# Patient Record
Sex: Male | Born: 1965 | Race: White | Hispanic: No | Marital: Married | Smoking: Never smoker
Health system: Southern US, Community
[De-identification: ages and names within clinical notes are randomized; demographics above are authoritative.]

## PROBLEM LIST (undated history)

## (undated) DIAGNOSIS — R011 Cardiac murmur, unspecified: Secondary | ICD-10-CM

## (undated) DIAGNOSIS — E7800 Pure hypercholesterolemia, unspecified: Secondary | ICD-10-CM

## (undated) DIAGNOSIS — I499 Cardiac arrhythmia, unspecified: Secondary | ICD-10-CM

## (undated) DIAGNOSIS — Z9889 Other specified postprocedural states: Secondary | ICD-10-CM

## (undated) DIAGNOSIS — K219 Gastro-esophageal reflux disease without esophagitis: Secondary | ICD-10-CM

## (undated) DIAGNOSIS — R112 Nausea with vomiting, unspecified: Secondary | ICD-10-CM

## (undated) DIAGNOSIS — Q248 Other specified congenital malformations of heart: Secondary | ICD-10-CM

## (undated) HISTORY — PX: CARDIAC CATHETERIZATION: SHX172

## (undated) HISTORY — DX: Cardiac murmur, unspecified: R01.1

## (undated) HISTORY — DX: Other specified congenital malformations of heart: Q24.8

## (undated) HISTORY — PX: CARDIAC VALVE REPLACEMENT: SHX585

## (undated) HISTORY — PX: CORONARY ARTERY BYPASS GRAFT: SHX141

---

## 1999-08-19 HISTORY — PX: TONSILLECTOMY: SUR1361

## 2004-08-18 HISTORY — PX: INGUINAL HERNIA REPAIR: SUR1180

## 2008-08-18 HISTORY — PX: COLONOSCOPY: SHX174

## 2008-10-19 ENCOUNTER — Ambulatory Visit: Payer: Self-pay | Admitting: Gastroenterology

## 2009-05-11 ENCOUNTER — Ambulatory Visit: Payer: Self-pay | Admitting: Internal Medicine

## 2010-05-01 LAB — HM COLONOSCOPY: HM Colonoscopy: NORMAL

## 2010-10-01 ENCOUNTER — Ambulatory Visit: Payer: Self-pay | Admitting: Internal Medicine

## 2011-04-04 ENCOUNTER — Encounter: Payer: Self-pay | Admitting: Internal Medicine

## 2011-04-09 ENCOUNTER — Encounter: Payer: Self-pay | Admitting: Internal Medicine

## 2011-04-09 ENCOUNTER — Ambulatory Visit (INDEPENDENT_AMBULATORY_CARE_PROVIDER_SITE_OTHER): Payer: 59 | Admitting: Internal Medicine

## 2011-04-09 DIAGNOSIS — R5381 Other malaise: Secondary | ICD-10-CM

## 2011-04-09 DIAGNOSIS — E785 Hyperlipidemia, unspecified: Secondary | ICD-10-CM

## 2011-04-09 DIAGNOSIS — R5383 Other fatigue: Secondary | ICD-10-CM

## 2011-04-09 DIAGNOSIS — Z9889 Other specified postprocedural states: Secondary | ICD-10-CM

## 2011-04-09 DIAGNOSIS — L42 Pityriasis rosea: Secondary | ICD-10-CM

## 2011-04-09 DIAGNOSIS — K219 Gastro-esophageal reflux disease without esophagitis: Secondary | ICD-10-CM

## 2011-04-09 DIAGNOSIS — Z8719 Personal history of other diseases of the digestive system: Secondary | ICD-10-CM

## 2011-04-09 DIAGNOSIS — R21 Rash and other nonspecific skin eruption: Secondary | ICD-10-CM

## 2011-04-09 DIAGNOSIS — Z125 Encounter for screening for malignant neoplasm of prostate: Secondary | ICD-10-CM

## 2011-04-09 NOTE — Assessment & Plan Note (Signed)
Symptoms now controlled with daily omeprazole.  He has deferred EGD given resolution of symptoms currently.  Recommended EGD with next colonoscopy.  Reviewed behavioral modifications to prevent noncturnal symptoms.

## 2011-04-09 NOTE — Assessment & Plan Note (Signed)
Managed with fenofibrate and niacin.  Repeat labs ordered today through [facility]

## 2011-04-09 NOTE — Progress Notes (Signed)
  Subjective:    Patient ID: Frank [name], male    DOB: 1966-07-24, 45 y.o.   MRN: [id number]  HPI    Review of Systems     Objective:   Physical Exam        Assessment & Plan:   Subjective:     Siddiq Kaluzny is a 45 y.o. male who presents for evaluation of a rash involving the chest. Rash started 3 months ago. Lesions are pink, and flat in texture. Rash has not changed over time. Rash causes no discomfort. Associated symptoms: none. Patient denies: abdominal pain and fever. Patient has not had contacts with similar rash. Patient has not had new exposures (soaps, lotions, laundry detergents, foods, medications, plants, insects or animals).  The following portions of the patient's history were reviewed and updated as appropriate: allergies, current medications, past family history, past medical history, past social history, past surgical history and problem list.  Review of Systems A comprehensive review of systems was negative except for: Integument/breast: positive for rash and skin lesion(s)    Objective:    BP 152/86  Pulse 76  Temp(Src) 97.6 F (36.4 C) (Oral)  Resp 14  Ht 5\' 9"  (1.753 m)  Wt 174 lb 8 oz (79.153 kg)  BMI 25.77 kg/m2 General:  alert, cooperative and appears stated age  Skin:  hyperpigmentation noted on trunk     Assessment:    pityriasis rosea    Plan:    Medications: lotrimin.

## 2011-04-09 NOTE — Assessment & Plan Note (Signed)
He had some pain in prior surgical region during recent coughing epiodes and requested hernia check.  Exam was normal.  Reassurance provided.

## 2011-04-09 NOTE — Assessment & Plan Note (Signed)
His rash apears to be pityriasis rosea, limited to the trunk.Marland Kitchen  He is concerned about tinea.  Explained that pityriasis has no treatment , is self limiting, but he is welcome to try otc lamisil on the lesion.

## 2011-04-09 NOTE — Assessment & Plan Note (Signed)
Prostate exam was normal.  He is requesting annual PSAs and will have it done next week.

## 2011-10-21 ENCOUNTER — Encounter: Payer: Self-pay | Admitting: Internal Medicine

## 2012-04-15 ENCOUNTER — Encounter: Payer: Self-pay | Admitting: Internal Medicine

## 2012-04-15 ENCOUNTER — Ambulatory Visit (INDEPENDENT_AMBULATORY_CARE_PROVIDER_SITE_OTHER): Payer: 59 | Admitting: Internal Medicine

## 2012-04-15 VITALS — BP 140/88 | HR 94 | Temp 98.6°F | Resp 16 | Ht 69.0 in | Wt 172.5 lb

## 2012-04-15 DIAGNOSIS — Z125 Encounter for screening for malignant neoplasm of prostate: Secondary | ICD-10-CM

## 2012-04-15 DIAGNOSIS — R5381 Other malaise: Secondary | ICD-10-CM

## 2012-04-15 DIAGNOSIS — R030 Elevated blood-pressure reading, without diagnosis of hypertension: Secondary | ICD-10-CM

## 2012-04-15 DIAGNOSIS — G4700 Insomnia, unspecified: Secondary | ICD-10-CM

## 2012-04-15 DIAGNOSIS — R5383 Other fatigue: Secondary | ICD-10-CM

## 2012-04-15 DIAGNOSIS — Z1211 Encounter for screening for malignant neoplasm of colon: Secondary | ICD-10-CM

## 2012-04-15 DIAGNOSIS — Z13220 Encounter for screening for lipoid disorders: Secondary | ICD-10-CM

## 2012-04-15 MED ORDER — ALPRAZOLAM 0.5 MG PO TBDP: 0.5000 mg | ORAL_TABLET | Freq: Two times a day (BID) | ORAL | Status: DC | PRN

## 2012-04-15 NOTE — Progress Notes (Signed)
Patient ID: Frank [name], male   DOB: October 01, 1965, 46 y.o.   MRN: [id number]  Patient Active Problem List  Diagnosis  . Esophageal reflux  . Hx of inguinal hernia surgery  . Other malaise and fatigue  . Special screening for malignant neoplasm of prostate  . Other and unspecified hyperlipidemia  . Rash and nonspecific skin eruption  . Insomnia    Subjective:  CC:   Chief Complaint  Patient presents with  . Annual Exam    HPI:   Frank [name] a 46 y.o. male who presents  Past Medical History  Diagnosis Date  . Congenital heart valve abnormality     bicuspid    Past Surgical History  Procedure Date  . Inguinal hernia repair 2006    left, Dr. Lemar Livings  . Tonsillectomy 2001         The following portions of the patient's history were reviewed and updated as appropriate: Allergies, current medications, and problem list.    Review of Systems:   12 Pt  review of systems was negative except those addressed in the HPI,     History   Social History  . Marital Status: Married    Spouse Name: N/A    Number of Children: N/A  . Years of Education: N/A   Occupational History  . 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 Engineer, water    Social History Main Topics  . Smoking status: Never Smoker   . Smokeless tobacco: Never Used  . Alcohol Use: Yes     occasional  . Drug Use: No  . Sexually Active: Not on file   Other Topics Concern  . Not on file   Social History Narrative   Pt has a dog.Exercises regularly- isometrics, no running.    Objective:  BP 140/88  Pulse 94  Temp 98.6 F (37 C) (Oral)  Resp 16  Ht 5\' 9"  (1.753 m)  Wt 172 lb 8 oz (78.245 kg)  BMI 25.47 kg/m2  SpO2 98%  General appearance: alert, cooperative and appears stated age Ears: normal TM's and external ear canals both ears Throat: lips, mucosa, and tongue normal; teeth and gums normal Neck: no adenopathy, no carotid bruit, supple, symmetrical,  trachea midline and thyroid not enlarged, symmetric, no tenderness/mass/nodules Back: symmetric, no curvature. ROM normal. No CVA tenderness. Lungs: clear to auscultation bilaterally Heart: regular rate and rhythm, S1, S2 normal, no murmur, click, rub or gallop Abdomen: soft, non-tender; bowel sounds normal; no masses,  no organomegaly Uro: testicular and prostate exam normal.  No hernias.  Stool was hemooccult negative.  Pulses: 2+ and symmetric Skin: Skin color, texture, turgor normal. No rashes or lesions Lymph nodes: Cervical, supraclavicular, and axillary nodes normal.  Assessment and Plan:  Screening for prostate cancer Digital rectal exam today was normal prostate and no masses.  Screening for colon cancer  Digital rectal exam today was normal Hemoccult is negative.   Updated Medication List Outpatient Encounter Prescriptions as of 04/15/2012  Medication Sig Dispense Refill  . fexofenadine (ALLEGRA) 180 MG tablet Take 180 mg by mouth daily.        . Inositol Niacinate (NIACIN FLUSH FREE) 500 MG CAPS Take 2 by mouth daily       . omeprazole (PRILOSEC) 40 MG capsule Take 40 mg by mouth daily.        . Red Yeast Rice 600 MG CAPS Take one by mouth twice a day.       Marland Kitchen  ALPRAZolam (XANAX) 0.5 MG tablet Take 1 tablet (0.5 mg total) by mouth at bedtime as needed for sleep.  30 tablet  3  . DISCONTD: ALPRAZolam (NIRAVAM) 0.5 MG dissolvable tablet Take 1 tablet (0.5 mg total) by mouth 2 (two) times daily as needed for anxiety.  60 tablet  0

## 2012-04-17 DIAGNOSIS — Z125 Encounter for screening for malignant neoplasm of prostate: Secondary | ICD-10-CM | POA: Insufficient documentation

## 2012-04-17 DIAGNOSIS — Z1211 Encounter for screening for malignant neoplasm of colon: Secondary | ICD-10-CM | POA: Insufficient documentation

## 2012-04-17 MED ORDER — ALPRAZOLAM 0.5 MG PO TABS: 0.5000 mg | ORAL_TABLET | Freq: Every evening | ORAL | Status: AC | PRN

## 2012-04-17 NOTE — Assessment & Plan Note (Signed)
Digital rectal exam today was normal prostate and no masses.

## 2012-04-17 NOTE — Assessment & Plan Note (Signed)
Digital rectal exam today was normal Hemoccult is negative.

## 2012-04-28 ENCOUNTER — Other Ambulatory Visit: Payer: Self-pay | Admitting: Internal Medicine

## 2012-04-29 LAB — HEPATIC FUNCTION PANEL
ALT: 16 IU/L (ref 0–44)
AST: 21 IU/L (ref 0–40)
Bilirubin, Direct: 0.1 mg/dL (ref 0.00–0.40)
Total Bilirubin: 0.4 mg/dL (ref 0.0–1.2)

## 2012-04-29 LAB — BASIC METABOLIC PANEL
BUN: 18 mg/dL (ref 6–24)
Calcium: 10.3 mg/dL — ABNORMAL HIGH (ref 8.7–10.2)
Creatinine, Ser: 1.38 mg/dL — ABNORMAL HIGH (ref 0.76–1.27)
GFR calc Af Amer: 70 mL/min/{1.73_m2} (ref >59)
GFR calc non Af Amer: 60 mL/min/{1.73_m2} (ref >59)
Glucose: 87 mg/dL (ref 65–99)

## 2012-04-29 LAB — LIPID PANEL W/O CHOL/HDL RATIO
Cholesterol, Total: 283 mg/dL — ABNORMAL HIGH (ref 100–199)
LDL Calculated: 201 mg/dL — ABNORMAL HIGH (ref 0–99)
Triglycerides: 185 mg/dL — ABNORMAL HIGH (ref 0–149)
VLDL Cholesterol Cal: 37 mg/dL (ref 5–40)

## 2012-07-20 ENCOUNTER — Telehealth: Payer: Self-pay | Admitting: Internal Medicine

## 2012-07-20 NOTE — Telephone Encounter (Signed)
Pt sent my chart note to get appointment to address the following health maintenance concerns  Tetanus/tdap Does pt need appointment for these or can i make nurse visit for this.  Also can pt get both at same time

## 2012-07-21 NOTE — Telephone Encounter (Signed)
It is one shot called TDaP and he needs an RN appt only

## 2012-07-21 NOTE — Telephone Encounter (Signed)
Appointment for tday 08/28/11 sent message through my chart

## 2012-07-22 ENCOUNTER — Encounter: Payer: Self-pay | Admitting: Adult Health

## 2012-07-22 ENCOUNTER — Ambulatory Visit (INDEPENDENT_AMBULATORY_CARE_PROVIDER_SITE_OTHER): Payer: 59 | Admitting: Adult Health

## 2012-07-22 VITALS — BP 128/91 | HR 98 | Temp 98.2°F | Ht 68.0 in | Wt 177.0 lb

## 2012-07-22 DIAGNOSIS — J029 Acute pharyngitis, unspecified: Secondary | ICD-10-CM

## 2012-07-22 MED ORDER — AMOXICILLIN-POT CLAVULANATE 875-125 MG PO TABS: 1.0000 | ORAL_TABLET | Freq: Two times a day (BID) | ORAL | Status: DC

## 2012-07-22 MED ORDER — GUAIFENESIN-CODEINE 100-10 MG/5ML PO SYRP: 5.0000 mL | ORAL_SOLUTION | Freq: Three times a day (TID) | ORAL | Status: DC | PRN

## 2012-07-22 NOTE — Progress Notes (Signed)
  Subjective:    Patient ID: Frank [name], male    DOB: 10-04-1965, 46 y.o.   MRN: [id number]  HPI  [name] is a very pleasant 46 y/o gentleman who presents with 3 day hx of general malaise, fever 101, chills, cough, sore throat, post nasal drip, now with productive greenish sputum. He has tried Delsym for cough without relief. He has been takingTylenol 500mg  q 4h prn for general malaise and to control fever. Reports that he has been out of work since Monday secondary to above symptoms. Patient reports receiving flu vaccine 5 weeks ago.  Current Outpatient Prescriptions on File Prior to Visit  Medication Sig Dispense Refill  . fexofenadine (ALLEGRA) 180 MG tablet Take 180 mg by mouth daily.        . Inositol Niacinate (NIACIN FLUSH FREE) 500 MG CAPS Take 2 by mouth daily       . omeprazole (PRILOSEC) 40 MG capsule Take 40 mg by mouth daily.        . Red Yeast Rice 600 MG CAPS Take one by mouth twice a day.          Review of Systems  Constitutional: Positive for fever and chills.  HENT: Positive for sore throat, rhinorrhea and postnasal drip.   Respiratory: Positive for cough. Negative for chest tightness, shortness of breath and wheezing.   Cardiovascular: Negative for chest pain.       Systolic murmur, congenital aortic stenosis  Gastrointestinal: Negative.   Neurological: Negative.        Sleep disturbance secondary to cough    BP 128/91  Pulse 98  Temp 98.2 F (36.8 C) (Oral)  Ht 5\' 8"  (1.727 m)  Wt 177 lb (80.287 kg)  BMI 26.91 kg/m2  SpO2 98%    Objective:   Physical Exam  Constitutional: He is oriented to person, place, and time. He appears well-developed and well-nourished.  HENT:       Pharyngeal erythema, post nasal drip  Cardiovascular: Normal rate and regular rhythm.   Murmur heard.      systolic  Pulmonary/Chest: No respiratory distress. He has no wheezes. He has no rales.  Neurological: He is alert and oriented to person, place, and time.  Skin: Skin  is warm and dry.  Psychiatric: He has a normal mood and affect. His behavior is normal. Judgment and thought content normal.        Assessment & Plan:

## 2012-07-22 NOTE — Patient Instructions (Addendum)
Start augmentin today. Take Robitussin AC up to 3 times daily as needed for cough. This medication will cause some sedation.  Call if symptoms do not improve in 2-3 days.

## 2012-07-22 NOTE — Assessment & Plan Note (Signed)
Pharyngeal erythema with post nasal drip. Abrupt onset of symptoms this past Monday. Ordered throat cultures for step and influenza at [employer]. Start Augmentin and Robitussin w/ codeine for cough. Continue tylenol for fever and general malaise as needed. Call if symptoms worsen or if no improvement in 2-3 days.

## 2012-07-26 ENCOUNTER — Telehealth: Payer: Self-pay | Admitting: Internal Medicine

## 2012-07-26 ENCOUNTER — Other Ambulatory Visit: Payer: Self-pay

## 2012-07-26 ENCOUNTER — Other Ambulatory Visit: Payer: Self-pay | Admitting: Internal Medicine

## 2012-07-26 MED ORDER — ALPRAZOLAM 0.5 MG PO TABS: 0.5000 mg | ORAL_TABLET | Freq: Every evening | ORAL | Status: DC | PRN

## 2012-07-26 NOTE — Telephone Encounter (Signed)
Spoke to patient via phone gave him lab results. 

## 2012-07-26 NOTE — Telephone Encounter (Signed)
HIS STREP CULTURE AND INFLUENZA CULTURES WERE NEGATIVE

## 2012-08-06 ENCOUNTER — Encounter: Payer: Self-pay | Admitting: Adult Health

## 2012-08-06 ENCOUNTER — Other Ambulatory Visit: Payer: Self-pay | Admitting: Adult Health

## 2012-08-06 DIAGNOSIS — J029 Acute pharyngitis, unspecified: Secondary | ICD-10-CM

## 2012-08-06 MED ORDER — AMOXICILLIN-POT CLAVULANATE 875-125 MG PO TABS: 1.0000 | ORAL_TABLET | Freq: Two times a day (BID) | ORAL | Status: DC

## 2012-08-06 MED ORDER — GUAIFENESIN-CODEINE 100-10 MG/5ML PO SYRP: 5.0000 mL | ORAL_SOLUTION | Freq: Three times a day (TID) | ORAL | Status: DC | PRN

## 2012-08-10 ENCOUNTER — Encounter: Payer: Self-pay | Admitting: Internal Medicine

## 2012-08-27 ENCOUNTER — Ambulatory Visit (INDEPENDENT_AMBULATORY_CARE_PROVIDER_SITE_OTHER): Payer: 59 | Admitting: Internal Medicine

## 2012-08-27 DIAGNOSIS — Z23 Encounter for immunization: Secondary | ICD-10-CM

## 2012-08-29 NOTE — Progress Notes (Signed)
Patient ID: Frank [name], male   DOB: [birth year], 47 y.o.   MRN: [id number]  Patient is here for an injection.

## 2013-04-20 ENCOUNTER — Encounter: Payer: Self-pay | Admitting: Emergency Medicine

## 2013-04-20 ENCOUNTER — Ambulatory Visit (INDEPENDENT_AMBULATORY_CARE_PROVIDER_SITE_OTHER): Payer: 59 | Admitting: Internal Medicine

## 2013-04-20 ENCOUNTER — Encounter: Payer: Self-pay | Admitting: Internal Medicine

## 2013-04-20 VITALS — BP 142/84 | HR 87 | Temp 97.8°F | Resp 14 | Ht 70.25 in | Wt 171.0 lb

## 2013-04-20 DIAGNOSIS — D229 Melanocytic nevi, unspecified: Secondary | ICD-10-CM

## 2013-04-20 DIAGNOSIS — G4700 Insomnia, unspecified: Secondary | ICD-10-CM

## 2013-04-20 DIAGNOSIS — Z0000 Encounter for general adult medical examination without abnormal findings: Secondary | ICD-10-CM

## 2013-04-20 DIAGNOSIS — Z1159 Encounter for screening for other viral diseases: Secondary | ICD-10-CM

## 2013-04-20 DIAGNOSIS — Z87898 Personal history of other specified conditions: Secondary | ICD-10-CM

## 2013-04-20 DIAGNOSIS — Z23 Encounter for immunization: Secondary | ICD-10-CM

## 2013-04-20 DIAGNOSIS — R011 Cardiac murmur, unspecified: Secondary | ICD-10-CM

## 2013-04-20 DIAGNOSIS — D239 Other benign neoplasm of skin, unspecified: Secondary | ICD-10-CM

## 2013-04-20 DIAGNOSIS — Z8774 Personal history of (corrected) congenital malformations of heart and circulatory system: Secondary | ICD-10-CM | POA: Insufficient documentation

## 2013-04-20 DIAGNOSIS — Z87438 Personal history of other diseases of male genital organs: Secondary | ICD-10-CM

## 2013-04-20 DIAGNOSIS — K219 Gastro-esophageal reflux disease without esophagitis: Secondary | ICD-10-CM

## 2013-04-20 NOTE — Assessment & Plan Note (Signed)
Using meds prn,  Improved with diet.

## 2013-04-20 NOTE — Progress Notes (Signed)
Patient ID: Frank [name], male   DOB: September 17, 1965, 47 y.o.   MRN: [id number]  Patient Active Problem List   Diagnosis Date Noted  . Routine general medical examination at a health care facility 04/22/2013  . Need for hepatitis C screening test 04/20/2013  . Mole of skin 04/20/2013  . Heart murmur, systolic 04/20/2013  . Acute pharyngitis 07/22/2012  . Screening for prostate cancer 04/17/2012  . Screening for colon cancer 04/17/2012  . Insomnia 04/15/2012  . Esophageal reflux 04/09/2011  . Hx of inguinal hernia surgery 04/09/2011  . Other malaise and fatigue 04/09/2011  . Special screening for malignant neoplasm of prostate 04/09/2011  . Other and unspecified hyperlipidemia 04/09/2011  . Rash and nonspecific skin eruption 04/09/2011    Subjective:  CC:   Chief Complaint  Patient presents with  . Annual Exam    HPI:   Frank [name] a 47 y.o. male who presents for his annual physical exam .  He feels great, has lost  7 lbs  Cut out peanuts and since his weight loss his acid reflux has improved.  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   He has had no recent viral infections.  Saw Raquel for bacterial sinusitis,  Resolved with antibiotics.    Past Medical History  Diagnosis Date  . Congenital heart valve abnormality     bicuspid    Past Surgical History  Procedure Laterality Date  . Inguinal hernia repair  2006    left, Dr. Lemar Livings  . Tonsillectomy  2001       The following portions of the patient's history were reviewed and updated as appropriate: Allergies, current medications, and problem list.    Review of Systems:   12 Pt  review of systems was negative except those addressed in the HPI,     History   Social History  . Marital Status: Married    Spouse Name: N/A    Number of Children: N/A  . Years of Education: N/A   Occupational History  . 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 Smoking status: Never Smoker   . Smokeless tobacco: Never Used  . Alcohol Use: 4.2 oz/week    7 Glasses of wine per week     Comment: occasional  . Drug Use: No  . Sexual Activity: Yes   Other Topics Concern  . Not on file   Social History Narrative   Pt has a dog.   Exercises regularly- isometrics, no running.    Objective:  Filed Vitals:   04/20/13 0847  BP: 142/84  Pulse: 87  Temp: 97.8 F (36.6 C)  Resp: 14     BP 142/84  Pulse 87  Temp(Src) 97.8 F (36.6 C) (Oral)  Resp 14  Ht 5' 10.25" (1.784 m)  Wt 171 lb (77.565 kg)  BMI 24.37 kg/m2  SpO2 99%  General Appearance:    Alert, cooperative, no distress, appears stated age  Head:    Normocephalic, without obvious abnormality, atraumatic  Eyes:    PERRL, conjunctiva/corneas clear, EOM's intact, fundi    benign, both eyes       Ears:    Normal TM's and external ear canals, both ears  Nose:   Nares normal, septum midline, mucosa normal, no drainage   or sinus tenderness  Throat:   Lips, mucosa, and  tongue normal; teeth and gums normal  Neck:   Supple, symmetrical, trachea midline, no adenopathy;       thyroid:  No enlargement/tenderness/nodules; no carotid   bruit or JVD  Back:     Symmetric, no curvature, ROM normal, no CVA tenderness  Lungs:     Clear to auscultation bilaterally, respirations unlabored  Chest wall:    No tenderness or deformity  Heart:    Regular rate and rhythm, S1 and S2 normal, no murmur, rub   or gallop  Abdomen:     Soft, non-tender, bowel sounds active all four quadrants,    no masses, no organomegaly  Genitalia:    Normal male without, discharge or tenderness.  Right sided testicular nodule appreciated (chronic)  Rectal:    Normal tone, normal prostate, no masses or tenderness;   guaiac negative stool  Extremities:   Extremities normal, atraumatic, no cyanosis or edema  Pulses:   2+ and symmetric  all extremities  Skin:   Skin color, texture, turgor normal, no rashes or lesions  Lymph nodes:   Cervical, supraclavicular, and axillary nodes normal  Neurologic:   CNII-XII intact. Normal strength, sensation and reflexes      throughout   Assessment and Plan:  Esophageal reflux Using meds prn,  Improved with diet.   Mole of skin Refer to [facility],  Dr.  Roseanne Kaufman   for evaluation of nevus on lower back that has irregular borders.    Routine general medical examination at a health care facility Annual male exam was done including testicular and prostate exam. PSA is pending . Vaccination status reviewed and options offerred.   Testicular mass Unable to find any reference to this testicular mass in my notes prior to 2012. I also searched the [facility] website and could not find the old testicular ultrasound. Since the mass is still present I'm recommending that he be reimaged.  Insomnia We discussed a trial of alprazolam for sleep initiation. he has been cautioned not to use this in combination with alcohol.  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 Juliann Pares  in February. Records have been requested.  A total of 45 minutes was spent with patient more than half of which was spent in counseling, reviewing records from other prviders and coordination of care.  Updated Medication List Outpatient Encounter Prescriptions as of 04/20/2013  Medication Sig Dispense Refill  . ALPRAZolam (XANAX) 0.5 MG tablet Take 1 tablet (0.5 mg total) by mouth at bedtime as needed.  30 tablet  5  . fexofenadine (ALLEGRA) 180 MG tablet Take 180 mg by mouth daily.        . Inositol Niacinate (NIACIN FLUSH FREE) 500 MG CAPS Take 2 by mouth daily       . omeprazole (PRILOSEC) 40 MG capsule Take 40 mg by mouth daily.        . Red Yeast Rice 600 MG CAPS Take one by mouth twice a day.       . [DISCONTINUED]  amoxicillin-clavulanate (AUGMENTIN) 875-125 MG per tablet Take 1 tablet by mouth 2 (two) times daily.  20 tablet  0  . [DISCONTINUED] guaiFENesin-codeine (ROBITUSSIN AC) 100-10 MG/5ML syrup Take 5 mLs by mouth 3 (three) times daily as needed for cough.  120 mL  0   No facility-administered encounter medications on file as of 04/20/2013.

## 2013-04-20 NOTE — Patient Instructions (Addendum)
You had your annual  wellness exam today  You received your influenza vaccine today.  We will contact you with the bloodwork results  Referral to Dr Roseanne Kaufman for evaluation of mole on your lower back

## 2013-04-20 NOTE — Assessment & Plan Note (Addendum)
Refer to [facility],  Dr.  Roseanne Kaufman   for evaluation of nevus on lower back that has irregular borders.

## 2013-04-22 ENCOUNTER — Encounter: Payer: Self-pay | Admitting: Internal Medicine

## 2013-04-22 ENCOUNTER — Other Ambulatory Visit: Payer: Self-pay | Admitting: Internal Medicine

## 2013-04-22 ENCOUNTER — Telehealth: Payer: Self-pay | Admitting: Internal Medicine

## 2013-04-22 DIAGNOSIS — Z0000 Encounter for general adult medical examination without abnormal findings: Secondary | ICD-10-CM | POA: Insufficient documentation

## 2013-04-22 DIAGNOSIS — Z0001 Encounter for general adult medical examination with abnormal findings: Secondary | ICD-10-CM | POA: Insufficient documentation

## 2013-04-22 DIAGNOSIS — N503 Cyst of epididymis: Secondary | ICD-10-CM | POA: Insufficient documentation

## 2013-04-22 MED ORDER — OMEPRAZOLE 40 MG PO CPDR: 40.0000 mg | DELAYED_RELEASE_CAPSULE | Freq: Every day | ORAL | Status: DC

## 2013-04-22 MED ORDER — ALPRAZOLAM 0.5 MG PO TABS: 0.5000 mg | ORAL_TABLET | Freq: Every evening | ORAL | Status: DC | PRN

## 2013-04-22 NOTE — Telephone Encounter (Signed)
Unable to find any reference to his right-sided testicular mass in my notes prior to 2012. I also searched the [facility] website and could not find the old testicular ultrasound. Since the mass is still present I'm recommending that he be reimaged.

## 2013-04-22 NOTE — Assessment & Plan Note (Addendum)
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 Juliann Pares  in February. Records have been requested.

## 2013-04-22 NOTE — Assessment & Plan Note (Signed)
Unable to find any reference to this testicular mass in my notes prior to 2012. I also searched the [facility] website and could not find the old testicular ultrasound. Since the mass is still present I'm recommending that he be reimaged.

## 2013-04-22 NOTE — Telephone Encounter (Signed)
Spoke with patient and he would like to look through his medical records before scheduling ultrasound.  If he can not find the result he will go ahead with the ultrasound. Also he has an appointment coming up but is requesting a refill of Xanax and omeprazole.

## 2013-04-22 NOTE — Telephone Encounter (Signed)
He was just seen ,  i will refill the meds,

## 2013-04-22 NOTE — Assessment & Plan Note (Signed)
We discussed a trial of alprazolam for sleep initiation. he has been cautioned not to use this in combination with alcohol.

## 2013-04-22 NOTE — Assessment & Plan Note (Signed)
Annual male exam was done including testicular and prostate exam. PSA is pending . Vaccination status reviewed and options offerred.

## 2013-04-25 ENCOUNTER — Encounter: Payer: Self-pay | Admitting: Internal Medicine

## 2013-04-27 LAB — BASIC METABOLIC PANEL
BUN: 17 mg/dL (ref 4–21)
Sodium: 138 mmol/L (ref 137–147)

## 2013-04-27 LAB — LIPID PANEL
HDL: 46 mg/dL (ref 35–70)
LDL Cholesterol: 155 mg/dL

## 2013-04-27 LAB — CBC AND DIFFERENTIAL
Hemoglobin: 14.8 g/dL (ref 13.5–17.5)
Platelets: 231 10*3/uL (ref 150–399)
WBC: 8.2 10^3/mL

## 2013-04-27 LAB — HEPATIC FUNCTION PANEL
AST: 17 U/L (ref 14–40)
Alkaline Phosphatase: 67 U/L (ref 25–125)

## 2013-04-29 ENCOUNTER — Telehealth: Payer: Self-pay | Admitting: Internal Medicine

## 2013-04-29 ENCOUNTER — Ambulatory Visit: Payer: Self-pay | Admitting: Internal Medicine

## 2013-04-29 NOTE — Telephone Encounter (Signed)
All labs normal,  Cholesterol improved,  Triglycerides down 30 pts and LDL is down 50 pts!!!.  PSA normal at 0.5,  thyroid normal  ALL GOOD

## 2013-05-02 ENCOUNTER — Encounter: Payer: Self-pay | Admitting: *Deleted

## 2013-05-02 NOTE — Telephone Encounter (Signed)
Letter mailed

## 2013-05-03 ENCOUNTER — Telehealth: Payer: Self-pay | Admitting: Internal Medicine

## 2013-05-03 DIAGNOSIS — N5089 Other specified disorders of the male genital organs: Secondary | ICD-10-CM

## 2013-05-03 NOTE — Telephone Encounter (Signed)
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  They are recommending close follow up,.  I would prefer to have him see  Urologist.  Does he have a preference who I refer him to/

## 2013-05-04 NOTE — Telephone Encounter (Signed)
Left message for patient to return call to office. 

## 2013-05-05 NOTE — Telephone Encounter (Signed)
Dr Orson Slick has retired,.  I prefer Ed Houser in [place],. [facility]

## 2013-05-05 NOTE — Telephone Encounter (Signed)
Patient stated he would like for you to refer him to your choice of urologist.

## 2013-05-06 NOTE — Telephone Encounter (Signed)
Left message for patient to return call to office. 

## 2013-05-08 ENCOUNTER — Telehealth: Payer: Self-pay | Admitting: Internal Medicine

## 2013-05-08 DIAGNOSIS — Z8774 Personal history of (corrected) congenital malformations of heart and circulatory system: Secondary | ICD-10-CM

## 2013-05-12 ENCOUNTER — Encounter: Payer: Self-pay | Admitting: Internal Medicine

## 2013-05-20 ENCOUNTER — Encounter: Payer: Self-pay | Admitting: Internal Medicine

## 2013-05-31 ENCOUNTER — Encounter: Payer: Self-pay | Admitting: *Deleted

## 2013-06-01 ENCOUNTER — Encounter: Payer: Self-pay | Admitting: Internal Medicine

## 2013-06-01 ENCOUNTER — Ambulatory Visit (INDEPENDENT_AMBULATORY_CARE_PROVIDER_SITE_OTHER): Payer: 59 | Admitting: Internal Medicine

## 2013-06-01 VITALS — BP 140/78 | HR 97 | Temp 97.9°F | Resp 12 | Wt 172.0 lb

## 2013-06-01 DIAGNOSIS — N5089 Other specified disorders of the male genital organs: Secondary | ICD-10-CM

## 2013-06-01 DIAGNOSIS — L255 Unspecified contact dermatitis due to plants, except food: Secondary | ICD-10-CM

## 2013-06-01 DIAGNOSIS — N508 Other specified disorders of male genital organs: Secondary | ICD-10-CM

## 2013-06-01 DIAGNOSIS — L247 Irritant contact dermatitis due to plants, except food: Secondary | ICD-10-CM | POA: Insufficient documentation

## 2013-06-01 MED ORDER — ACYCLOVIR 400 MG PO TABS: 400.0000 mg | ORAL_TABLET | ORAL | Status: DC

## 2013-06-01 MED ORDER — TRAMADOL HCL 50 MG PO TABS: 50.0000 mg | ORAL_TABLET | Freq: Four times a day (QID) | ORAL | Status: DC | PRN

## 2013-06-01 MED ORDER — CEPHALEXIN 500 MG PO CAPS: 500.0000 mg | ORAL_CAPSULE | Freq: Three times a day (TID) | ORAL | Status: DC

## 2013-06-01 MED ORDER — PREDNISONE (PAK) 10 MG PO TABS: ORAL_TABLET | ORAL | Status: DC

## 2013-06-01 NOTE — Progress Notes (Signed)
Patient ID: Frank [name], male   DOB: 1966/06/22, 47 y.o.   MRN: [id number]   Patient Active Problem List   Diagnosis Date Noted  . Contact dermatitis and eczema due to plant 06/01/2013  . Routine general medical examination at a health care facility 04/22/2013  . Testicular mass 04/22/2013  . Need for hepatitis C screening test 04/20/2013  . Mole of skin 04/20/2013  . Hx of bicuspid aortic valve 04/20/2013  . Screening for prostate cancer 04/17/2012  . Screening for colon cancer 04/17/2012  . Insomnia 04/15/2012  . Esophageal reflux 04/09/2011  . Hx of inguinal hernia surgery 04/09/2011  . Other malaise and fatigue 04/09/2011  . Special screening for malignant neoplasm of prostate 04/09/2011  . Other and unspecified hyperlipidemia 04/09/2011    Subjective:  CC:   No chief complaint on file.   HPI:   Frank [name] a 47 y.o. male who presents Painful red papules on left side on posterior neck.  Started with pain and swelling on his left  occipital scalp one week ago.   Pain and itching present for about a week   New papules have copped up daily . No hotel stays in two weeks.  No fevers, does yard work regularly and comes into contact with poison sumac.    Past Medical History  Diagnosis Date  . Congenital heart valve abnormality     bicuspid    Past Surgical History  Procedure Laterality Date  . Inguinal hernia repair  2006    left, Dr. Lemar Livings  . Tonsillectomy  2001       The following portions of the patient's history were reviewed and updated as appropriate: Allergies, current medications, and problem list.    Review of Systems:   12 Pt  review of systems was negative except those addressed in the HPI,     History   Social History  . Marital Status: Married    Spouse Name: N/A    Number of Children: N/A  . Years of Education: N/A   Occupational History  . 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 Smoking status: Never Smoker   . Smokeless tobacco: Never Used  . Alcohol Use: 4.2 oz/week    7 Glasses of wine per week     Comment: occasional  . Drug Use: No  . Sexual Activity: Yes   Other Topics Concern  . Not on file   Social History Narrative   Pt has a dog.   Exercises regularly- isometrics, no running.    Objective:  Filed Vitals:   06/01/13 1000  BP: 140/78  Pulse: 97  Temp: 97.9 F (36.6 C)  Resp: 12     General appearance: alert, cooperative and appears stated age Ears: normal TM's and external ear canals both ears Throat: lips, mucosa, and tongue normal; teeth and gums normal Neck: no adenopathy, no carotid bruit, supple, symmetrical, trachea midline and thyroid not enlarged, symmetric, no tenderness/mass/nodules Back: symmetric, no curvature. ROM normal. No CVA tenderness. Lungs: clear to auscultation bilaterally Heart: regular rate and rhythm, S1, S2 normal, no murmur, click, rub or gallop Abdomen: soft, non-tender; bowel sounds normal; no masses,  no organomegaly Pulses: 2+ and symmetric Skin: linear papular rash on posterior neck left side, minimal surrounding erythema Lymph nodes: Cervical, supraclavicular, and axillary nodes normal.  Assessment and Plan:  Contact dermatitis and eczema due to plant Difficult to say  if this is poison ivy vs zoster.  Will treat with steroid taper, but if rash worsens will start antiviral therapy for new papules and keflex for macular spread . rxs given for predniosne, acyclocvr and keflex   Testicular mass Repeat urology eval with ultrasound benign,  No further workup advised.    Updated Medication List Outpatient Encounter Prescriptions as of 06/01/2013  Medication Sig Dispense Refill  . ALPRAZolam (XANAX) 0.5 MG tablet Take 1 tablet (0.5 mg total) by mouth at bedtime as needed.  30 tablet  5  . fexofenadine (ALLEGRA) 180 MG tablet Take 180 mg by mouth daily.        . Inositol Niacinate  (NIACIN FLUSH FREE) 500 MG CAPS Take 2 by mouth daily       . omeprazole (PRILOSEC) 40 MG capsule Take 1 capsule (40 mg total) by mouth daily.  30 capsule  5  . Red Yeast Rice 600 MG CAPS Take one by mouth twice a day.       Marland Kitchen acyclovir (ZOVIRAX) 400 MG tablet Take 1 tablet (400 mg total) by mouth every 4 (four) hours while awake.  35 tablet  0  . cephALEXin (KEFLEX) 500 MG capsule Take 1 capsule (500 mg total) by mouth 3 (three) times daily.  21 capsule  0  . predniSONE (STERAPRED UNI-PAK) 10 MG tablet 6 tablets on Day 1 , then reduce by 1 tablet daily until gone  21 tablet  0  . traMADol (ULTRAM) 50 MG tablet Take 1 tablet (50 mg total) by mouth every 6 (six) hours as needed for pain.  30 tablet  0   No facility-administered encounter medications on file as of 06/01/2013.     No orders of the defined types were placed in this encounter.    No Follow-up on file.

## 2013-06-01 NOTE — Patient Instructions (Addendum)
I am treating yor for contact dermatitis due to posoin ivy with a prednisone taper  If you develop new blistering lesions,  stope the predniosne and start the acyclovir for shingles  If the redness gets worse without new blisters,  Add the cephalexin for staph infection   You can use the Tramadol prn for pain

## 2013-06-03 ENCOUNTER — Encounter: Payer: Self-pay | Admitting: Internal Medicine

## 2013-06-03 NOTE — Assessment & Plan Note (Signed)
Repeat urology eval with ultrasound benign,  No further workup advised.

## 2013-06-03 NOTE — Assessment & Plan Note (Signed)
Difficult to say if this is poison ivy vs zoster.  Will treat with steroid taper, but if rash worsens will start antiviral therapy for new papules and keflex for macular spread . rxs given for predniosne, acyclocvr and keflex

## 2013-08-09 ENCOUNTER — Other Ambulatory Visit: Payer: Self-pay | Admitting: *Deleted

## 2013-08-09 MED ORDER — OMEPRAZOLE 40 MG PO CPDR: 40.0000 mg | DELAYED_RELEASE_CAPSULE | Freq: Every day | ORAL | Status: DC

## 2013-08-09 MED ORDER — ALPRAZOLAM 0.5 MG PO TABS: 0.5000 mg | ORAL_TABLET | Freq: Every evening | ORAL | Status: DC | PRN

## 2013-08-09 NOTE — Telephone Encounter (Signed)
Requesting RF to [facility], ok?

## 2013-08-09 NOTE — Telephone Encounter (Signed)
Rx faxed to pharmacy  

## 2014-01-06 ENCOUNTER — Other Ambulatory Visit: Payer: Self-pay | Admitting: Internal Medicine

## 2014-01-18 ENCOUNTER — Other Ambulatory Visit: Payer: Self-pay | Admitting: *Deleted

## 2014-01-18 MED ORDER — ALPRAZOLAM 0.5 MG PO TABS: 0.5000 mg | ORAL_TABLET | Freq: Every evening | ORAL | Status: DC | PRN

## 2014-01-18 NOTE — Telephone Encounter (Signed)
Ok to refill,  printed rx  

## 2014-01-18 NOTE — Telephone Encounter (Signed)
Okay to refill? Last seen on 06/01/13. Next appt on 05/01/14.

## 2014-01-18 NOTE — Telephone Encounter (Signed)
Rx faxed to pharmacy  

## 2014-04-28 ENCOUNTER — Encounter: Payer: 59 | Admitting: Internal Medicine

## 2014-05-01 ENCOUNTER — Ambulatory Visit (INDEPENDENT_AMBULATORY_CARE_PROVIDER_SITE_OTHER): Payer: 59 | Admitting: Internal Medicine

## 2014-05-01 ENCOUNTER — Encounter: Payer: Self-pay | Admitting: Internal Medicine

## 2014-05-01 VITALS — BP 142/80 | HR 95 | Temp 97.7°F | Resp 16 | Ht 69.5 in | Wt 169.5 lb

## 2014-05-01 DIAGNOSIS — Z23 Encounter for immunization: Secondary | ICD-10-CM

## 2014-05-01 DIAGNOSIS — R002 Palpitations: Secondary | ICD-10-CM | POA: Insufficient documentation

## 2014-05-01 DIAGNOSIS — R351 Nocturia: Secondary | ICD-10-CM

## 2014-05-01 DIAGNOSIS — M25569 Pain in unspecified knee: Secondary | ICD-10-CM

## 2014-05-01 DIAGNOSIS — M25461 Effusion, right knee: Secondary | ICD-10-CM

## 2014-05-01 DIAGNOSIS — I471 Supraventricular tachycardia: Secondary | ICD-10-CM | POA: Insufficient documentation

## 2014-05-01 DIAGNOSIS — Z8774 Personal history of (corrected) congenital malformations of heart and circulatory system: Secondary | ICD-10-CM

## 2014-05-01 DIAGNOSIS — M25561 Pain in right knee: Secondary | ICD-10-CM

## 2014-05-01 DIAGNOSIS — Z0000 Encounter for general adult medical examination without abnormal findings: Secondary | ICD-10-CM

## 2014-05-01 DIAGNOSIS — M25469 Effusion, unspecified knee: Secondary | ICD-10-CM

## 2014-05-01 MED ORDER — ALPRAZOLAM 0.5 MG PO TABS: 0.5000 mg | ORAL_TABLET | Freq: Every evening | ORAL | Status: DC | PRN

## 2014-05-01 MED ORDER — PROPRANOLOL HCL 10 MG PO TABS: 10.0000 mg | ORAL_TABLET | Freq: Three times a day (TID) | ORAL | Status: DC

## 2014-05-01 NOTE — Progress Notes (Signed)
Pre-visit discussion using our clinic review tool. No additional management support is needed unless otherwise documented below in the visit note.  

## 2014-05-01 NOTE — Progress Notes (Signed)
Patient ID: Frank [name], male   DOB: [birth year], 48 y.o.   MRN: [id number]    The patient is here for his annual examination and management of other chronic and acute problems.  Twisted right knee 3 weeks ago after missing the last step off a ladder.  The knee was twisted to avoid falling and had immediate pain and  Swelling.  He was unable to bear weight fo a few days and kept icing the knee. He sees Dr [name] next week. History of cruciate ligament or meniscal tear 8 yrs ago, has not been right  since  Saw [place] last week for recurrrent prolonged palpitations .  Holter was done last week,  No results yet history of aortic valve bicuspid,  ECHO looked fine.      The risk factors are reflected in the social history.  The roster of all physicians providing medical care to patient - is listed in the Snapshot section of the chart.   Home safety : The patient has smoke detectors in the home. They wear seatbelts.  There are no firearms at home. There is no violence in the home.   There is no risks for hepatitis, STDs or HIV. There is no   history of blood transfusion. They have no travel history to infectious disease endemic areas of the world.  The patient has seen their dentist in the last six month. They have seen their eye doctor in the last year. They admit to no hearing difficulty with regard to whispered voices and some television programs.  They have deferred audiologic testing in the last year.  They do not  have excessive sun exposure. Discussed the need for sun protection: hats, long sleeves and use of sunscreen if there is significant sun exposure.   Diet: the importance of a healthy diet is discussed. They do have a healthy diet.  The benefits of regular aerobic exercise were discussed. he walks 4 times per week ,  20 minutes.   Depression screen: there are no signs or vegative symptoms of depression- irritability, change in appetite, anhedonia,  sadness/tearfullness.  Cognitive assessment: the patient manages all their financial and personal affairs and is actively engaged. They could relate day,date,year and events; recalled 2/3 objects at 3 minutes; performed clock-face test normally.  The following portions of the patient's history were reviewed and updated as appropriate: allergies, current medications, past family history, past medical history,  past surgical history, past social history  and problem list.  Visual acuity was not assessed per patient preference since she has regular follow up with her ophthalmologist. Hearing and body mass index were assessed and reviewed.   During the course of the visit the patient was educated and counseled about appropriate screening and preventive services including : fall prevention , diabetes screening, nutrition counseling, colorectal cancer screening, and recommended immunizations.    Objective:  BP 142/80  Pulse 95  Temp(Src) 97.7 F (36.5 C) (Oral)  Resp 16  Ht 5' 9.5" (1.765 m)  Wt 169 lb 8 oz (76.885 kg)  BMI 24.68 kg/m2  SpO2 98%  General Appearance:    Alert, cooperative, no distress, appears stated age  Head:    Normocephalic, without obvious abnormality, atraumatic  Eyes:    PERRL, conjunctiva/corneas clear, EOM's intact, fundi    benign, both eyes       Ears:    Normal TM's and external ear canals, both ears  Nose:   Nares normal, septum midline, mucosa normal, no drainage  or sinus tenderness  Throat:   Lips, mucosa, and tongue normal; teeth and gums normal  Neck:   Supple, symmetrical, trachea midline, no adenopathy;       thyroid:  No enlargement/tenderness/nodules; no carotid   bruit or JVD  Back:     Symmetric, no curvature, ROM normal, no CVA tenderness  Lungs:     Clear to auscultation bilaterally, respirations unlabored  Chest wall:    No tenderness or deformity  Heart:    Regular rate and rhythm, S1 and S2 normal, no murmur, rub   or gallop  Abdomen:      Soft, non-tender, bowel sounds active all four quadrants,    no masses, no organomegaly  Genitalia:    Normal male without lesion, discharge or tenderness  Rectal:    Normal tone, normal prostate, no masses or tenderness;   guaiac negative stool  Extremities:   Extremities normal, atraumatic, no cyanosis or edema  Pulses:   2+ and symmetric all extremities  Skin:   Skin color, texture, turgor normal, no rashes or lesions  Lymph nodes:   Cervical, supraclavicular, and axillary nodes normal  Neurologic:   CNII-XII intact. Normal strength, sensation and reflexes      throughout   Assessment and Plan:   Routine general medical examination at a health care facility Annual male exam was done including testicular and prostate exam. PSA is pending .  Colon ca screening was reviewed and options given.    Hx of bicuspid aortic valve Annual ECHO done by [facility] recently noted normal systolic funtion per patient.  He remains asymptomatic   Mechanical knee pain suspect cruciate tear.  Mri knee ordered in anticipated of orthopedic evaluation by Dr [name].    Updated Medication List Outpatient Encounter Prescriptions as of 05/01/2014  Medication Sig  . ALPRAZolam (XANAX) 0.5 MG tablet Take 1 tablet (0.5 mg total) by mouth at bedtime as needed.  . fexofenadine (ALLEGRA) 180 MG tablet Take 180 mg by mouth daily.    . Inositol Niacinate (NIACIN FLUSH FREE) 500 MG CAPS Take 2 by mouth daily   . omeprazole (PRILOSEC) 40 MG capsule Take 1 capsule by mouth  daily  . Red Yeast Rice 600 MG CAPS Take two by mouth twice a day.  . [DISCONTINUED] ALPRAZolam (XANAX) 0.5 MG tablet Take 1 tablet (0.5 mg total) by mouth at bedtime as needed.  . propranolol (INDERAL) 10 MG tablet Take 1 tablet (10 mg total) by mouth 3 (three) times daily. As needed for palpitations  . [DISCONTINUED] acyclovir (ZOVIRAX) 400 MG tablet Take 1 tablet (400 mg total) by mouth every 4 (four) hours while awake.  . [DISCONTINUED] cephALEXin  (KEFLEX) 500 MG capsule Take 1 capsule (500 mg total) by mouth 3 (three) times daily.  . [DISCONTINUED] predniSONE (STERAPRED UNI-PAK) 10 MG tablet 6 tablets on Day 1 , then reduce by 1 tablet daily until gone  . [DISCONTINUED] traMADol (ULTRAM) 50 MG tablet Take 1 tablet (50 mg total) by mouth every 6 (six) hours as needed for pain.

## 2014-05-02 NOTE — Assessment & Plan Note (Signed)
suspect cruciate tear.  Mri knee ordered in anticipated of orthopedic evaluation by Dr [name].

## 2014-05-02 NOTE — Assessment & Plan Note (Signed)
Annual ECHO done by [facility] recently noted normal systolic funtion per patient.  He remains asymptomatic

## 2014-05-02 NOTE — Assessment & Plan Note (Signed)
Annual male exam was done including testicular and prostate exam. PSA is pending .  Colon ca screening was reviewed and options given.

## 2014-05-03 ENCOUNTER — Ambulatory Visit: Payer: Self-pay | Admitting: Internal Medicine

## 2014-05-04 ENCOUNTER — Telehealth: Payer: Self-pay | Admitting: Internal Medicine

## 2014-05-04 DIAGNOSIS — Z9889 Other specified postprocedural states: Secondary | ICD-10-CM | POA: Insufficient documentation

## 2014-05-04 DIAGNOSIS — S83511A Sprain of anterior cruciate ligament of right knee, initial encounter: Secondary | ICD-10-CM

## 2014-05-12 LAB — CBC AND DIFFERENTIAL
HCT: 42 % (ref 41–53)
Hemoglobin: 14.5 g/dL (ref 13.5–17.5)
Platelets: 210 10*3/uL (ref 150–399)
WBC: 8.8 10^3/mL

## 2014-05-12 LAB — BASIC METABOLIC PANEL
BUN: 17 mg/dL (ref 4–21)
CREATININE: 1.2 mg/dL (ref 0.6–1.3)
Glucose: 84 mg/dL
Potassium: 4.6 mmol/L (ref 3.4–5.3)
Sodium: 137 mmol/L (ref 137–147)

## 2014-05-12 LAB — LIPID PANEL
Cholesterol: 270 mg/dL — AB (ref 0–200)
HDL: 40 mg/dL (ref 35–70)
LDL Cholesterol: 175 mg/dL
TRIGLYCERIDES: 274 mg/dL — AB (ref 40–160)

## 2014-05-12 LAB — HEPATIC FUNCTION PANEL
AST: 20 U/L (ref 14–40)
Alkaline Phosphatase: 63 U/L (ref 25–125)
Bilirubin, Total: 0.4 mg/dL

## 2014-05-12 LAB — TSH: TSH: 3.7 u[IU]/mL (ref 0.41–5.90)

## 2014-05-13 ENCOUNTER — Encounter: Payer: Self-pay | Admitting: Internal Medicine

## 2014-05-16 ENCOUNTER — Telehealth: Payer: Self-pay | Admitting: Internal Medicine

## 2014-05-16 ENCOUNTER — Telehealth: Payer: Self-pay | Admitting: *Deleted

## 2014-05-16 NOTE — Telephone Encounter (Deleted)
[facility] called pt had lab done on 09.24.2015 cbc, cmet, urine,

## 2014-05-16 NOTE — Telephone Encounter (Signed)
error 

## 2014-05-17 NOTE — Telephone Encounter (Signed)
Mailed copy labs to patient

## 2014-05-18 ENCOUNTER — Other Ambulatory Visit (HOSPITAL_COMMUNITY): Payer: Self-pay | Admitting: Orthopedic Surgery

## 2014-06-05 ENCOUNTER — Encounter: Payer: Self-pay | Admitting: Internal Medicine

## 2014-06-29 ENCOUNTER — Encounter: Payer: Self-pay | Admitting: Internal Medicine

## 2014-07-21 NOTE — Pre-Procedure Instructions (Signed)
Frank [name]  07/21/2014   Your procedure is scheduled on:  Tuesday, Dec. 15th   Report to [facility] Admitting at 5:30 AM.   Call this number if you have problems the morning of surgery: [phone]   Remember:   Do not eat food or drink liquids after midnight Monday.   Take these medicines the morning of surgery with A SIP OF WATER: Xanax, Omeprazole, Propranolol   Do not wear jewelry - no rings or watches.  Do not wear lotions or colognes.  You may NOT wear deodorant the morning of surgery.             Men may shave face and neck.   Do not bring valuables to the hospital.  [facility] is not responsible for any belongings or valuables.               Contacts, dentures or bridgework may not be worn into surgery.  Leave suitcase in the car. After surgery it may be brought to your room.  For patients admitted to the hospital, discharge time is determined by your treatment team.               Name and phone number of your driver:    Special Instructions: "Preparing for Surgery" instruction sheet.   Please read over the following fact sheets that you were given: Pain Booklet, Coughing and Deep Breathing and Surgical Site Infection Prevention

## 2014-07-24 ENCOUNTER — Encounter (HOSPITAL_COMMUNITY)
Admission: RE | Admit: 2014-07-24 | Discharge: 2014-07-24 | Disposition: A | Discharge status: Home / Self Care | Payer: 59 | Source: Ambulatory Visit | Attending: Orthopedic Surgery | Admitting: Orthopedic Surgery

## 2014-07-24 ENCOUNTER — Encounter (HOSPITAL_COMMUNITY): Payer: Self-pay

## 2014-07-24 DIAGNOSIS — Q231 Congenital insufficiency of aortic valve: Secondary | ICD-10-CM | POA: Diagnosis not present

## 2014-07-24 DIAGNOSIS — I10 Essential (primary) hypertension: Secondary | ICD-10-CM | POA: Diagnosis not present

## 2014-07-24 DIAGNOSIS — Z01818 Encounter for other preprocedural examination: Secondary | ICD-10-CM | POA: Diagnosis present

## 2014-07-24 DIAGNOSIS — I4510 Unspecified right bundle-branch block: Secondary | ICD-10-CM | POA: Diagnosis not present

## 2014-07-24 DIAGNOSIS — I083 Combined rheumatic disorders of mitral, aortic and tricuspid valves: Secondary | ICD-10-CM | POA: Diagnosis not present

## 2014-07-24 DIAGNOSIS — E780 Pure hypercholesterolemia: Secondary | ICD-10-CM | POA: Insufficient documentation

## 2014-07-24 DIAGNOSIS — K219 Gastro-esophageal reflux disease without esophagitis: Secondary | ICD-10-CM | POA: Diagnosis not present

## 2014-07-24 HISTORY — DX: Pure hypercholesterolemia, unspecified: E78.00

## 2014-07-24 HISTORY — DX: Nausea with vomiting, unspecified: R11.2

## 2014-07-24 HISTORY — DX: Cardiac arrhythmia, unspecified: I49.9

## 2014-07-24 HISTORY — DX: Other specified postprocedural states: Z98.890

## 2014-07-24 LAB — CBC
HEMATOCRIT: 43.2 % (ref 39.0–52.0)
HEMOGLOBIN: 14.8 g/dL (ref 13.0–17.0)
MCH: 28.8 pg (ref 26.0–34.0)
MCHC: 34.3 g/dL (ref 30.0–36.0)
MCV: 84.2 fL (ref 78.0–100.0)
Platelets: 221 10*3/uL (ref 150–400)
RBC: 5.13 MIL/uL (ref 4.22–5.81)
RDW: 14.2 % (ref 11.5–15.5)
WBC: 7.6 10*3/uL (ref 4.0–10.5)

## 2014-07-24 LAB — BASIC METABOLIC PANEL
Anion gap: 12 (ref 5–15)
BUN: 21 mg/dL (ref 6–23)
CO2: 26 mEq/L (ref 19–32)
Calcium: 9.9 mg/dL (ref 8.4–10.5)
Chloride: 101 mEq/L (ref 96–112)
Creatinine, Ser: 1.13 mg/dL (ref 0.50–1.35)
GFR calc Af Amer: 87 mL/min — ABNORMAL LOW (ref >90)
GFR calc non Af Amer: 75 mL/min — ABNORMAL LOW (ref >90)
GLUCOSE: 95 mg/dL (ref 70–99)
POTASSIUM: 4.5 meq/L (ref 3.7–5.3)
Sodium: 139 mEq/L (ref 137–147)

## 2014-07-24 NOTE — Progress Notes (Addendum)
Anesthesia Chart Review:  Pt is 48 year old male scheduled for R reconstruction ACL with hamstring graft, meniscal debridement as needed on 08/01/2014 with Dr. [name].   PMH: moderate aortic stenosis with a bicuspid valve (on echo 10/2013), PVC's on Holter monitor (04/2014), HTN, hypercholesterolemia, GERD  Pt's cardiologist is Dr. Lujean Amel at [facility].   Preoperative labs reviewed.    EKG: NSR. Possible LA enlargement. RBBB. T wave abnormality, consider inferolateral ischemia.   Echo 10/27/2013:  -Normal LV systolic function, EF 72% -Mild mitral, tricuspid, and aortic insufficiency -Mild to moderate aortic stenosis. AVA 1.17 cm2 (PK grad 125mm, Mean grad 49mm)   Dr. [name] is aware of upcoming surgery in note in Care Everywhere dated 05/25/2014. Dr. [name] recommends "low dose beta-blockers pre and postop to help with palpitations and tachycardia".   Attempting to get copy of old EKG and stress test from [place].   Willeen Cass, FNP-BC [facility] Short Stay Surgical Center/Anesthesiology Phone: [phone] 07/24/2014 4:56 PM  Addedum:  EKG from 2009 obtained. Comparing old and new EKGs, RBBB is new, T wave abnormality stable.   No stress test records available. Per Dr. Etta Quill note in Care Everywhere dated 04/25/2014, stress test was "ok".   If no changes, I anticipate pt can proceed with surgery as scheduled.   Willeen Cass, FNP-BC [facility] Short Stay Surgical Center/Anesthesiology Phone: [phone] 07/26/2014 3:24 PM

## 2014-07-24 NOTE — Progress Notes (Signed)
Patient was diagnosed at age of 89 with aortic valve murmur.  Sees Dr. [name] @ [facility] [phone].  Echo was performed in March (inside chart) 2015 and valve regurg is still present and hasn't changed.  I did call [facility] for an EKG to compare, and they do not have one.  I did one today.  There are notes from [place] in "request outside records" Has had holter monitor testing done also.

## 2014-07-31 MED ORDER — CEFAZOLIN SODIUM-DEXTROSE 2-3 GM-% IV SOLR
2.0000 g | INTRAVENOUS | Status: AC
Start: 2014-08-01 — End: 2014-08-01
  Administered 2014-08-01: 2 g via INTRAVENOUS
  Filled 2014-07-31: qty 50

## 2014-07-31 MED ORDER — CHLORHEXIDINE GLUCONATE 4 % EX LIQD
60.0000 mL | Freq: Once | CUTANEOUS | Status: DC
  Filled 2014-07-31: qty 60

## 2014-08-01 ENCOUNTER — Ambulatory Visit (HOSPITAL_COMMUNITY): Payer: 59 | Admitting: Emergency Medicine

## 2014-08-01 ENCOUNTER — Other Ambulatory Visit: Payer: Self-pay | Admitting: Physician Assistant

## 2014-08-01 ENCOUNTER — Ambulatory Visit (HOSPITAL_COMMUNITY): Payer: 59 | Admitting: Certified Registered Nurse Anesthetist

## 2014-08-01 ENCOUNTER — Encounter (HOSPITAL_COMMUNITY): Payer: Self-pay | Admitting: *Deleted

## 2014-08-01 ENCOUNTER — Observation Stay (HOSPITAL_COMMUNITY)
Admission: RE | Admit: 2014-08-01 | Discharge: 2014-08-02 | Disposition: A | Discharge status: Home / Self Care | Payer: 59 | Source: Ambulatory Visit | Attending: Orthopedic Surgery | Admitting: Orthopedic Surgery

## 2014-08-01 ENCOUNTER — Encounter (HOSPITAL_COMMUNITY)
Admission: RE | Disposition: A | Discharge status: Home / Self Care | Payer: Self-pay | Source: Ambulatory Visit | Attending: Orthopedic Surgery

## 2014-08-01 DIAGNOSIS — Z79899 Other long term (current) drug therapy: Secondary | ICD-10-CM | POA: Diagnosis not present

## 2014-08-01 DIAGNOSIS — S83511D Sprain of anterior cruciate ligament of right knee, subsequent encounter: Principal | ICD-10-CM | POA: Insufficient documentation

## 2014-08-01 DIAGNOSIS — M94261 Chondromalacia, right knee: Secondary | ICD-10-CM | POA: Insufficient documentation

## 2014-08-01 DIAGNOSIS — S83519A Sprain of anterior cruciate ligament of unspecified knee, initial encounter: Secondary | ICD-10-CM | POA: Diagnosis present

## 2014-08-01 DIAGNOSIS — X58XXXD Exposure to other specified factors, subsequent encounter: Secondary | ICD-10-CM | POA: Diagnosis not present

## 2014-08-01 DIAGNOSIS — E780 Pure hypercholesterolemia: Secondary | ICD-10-CM | POA: Insufficient documentation

## 2014-08-01 DIAGNOSIS — K219 Gastro-esophageal reflux disease without esophagitis: Secondary | ICD-10-CM | POA: Insufficient documentation

## 2014-08-01 DIAGNOSIS — I10 Essential (primary) hypertension: Secondary | ICD-10-CM | POA: Diagnosis not present

## 2014-08-01 DIAGNOSIS — I252 Old myocardial infarction: Secondary | ICD-10-CM | POA: Diagnosis not present

## 2014-08-01 DIAGNOSIS — Q231 Congenital insufficiency of aortic valve: Secondary | ICD-10-CM | POA: Insufficient documentation

## 2014-08-01 DIAGNOSIS — I350 Nonrheumatic aortic (valve) stenosis: Secondary | ICD-10-CM | POA: Insufficient documentation

## 2014-08-01 DIAGNOSIS — M2351 Chronic instability of knee, right knee: Secondary | ICD-10-CM | POA: Diagnosis not present

## 2014-08-01 HISTORY — PX: TEE WITHOUT CARDIOVERSION: SHX5443

## 2014-08-01 HISTORY — PX: ANTERIOR CRUCIATE LIGAMENT REPAIR: SHX115

## 2014-08-01 SURGERY — RECONSTRUCTION, KNEE, ACL, USING HAMSTRING GRAFT
Anesthesia: Regional | Site: Knee | Laterality: Right

## 2014-08-01 MED ORDER — LACTATED RINGERS IV SOLN
INTRAVENOUS | Status: DC | PRN
  Administered 2014-08-01 (×3): via INTRAVENOUS

## 2014-08-01 MED ORDER — EPHEDRINE SULFATE 50 MG/ML IJ SOLN
INTRAMUSCULAR | Status: AC
  Filled 2014-08-01: qty 1

## 2014-08-01 MED ORDER — LIDOCAINE HCL (CARDIAC) 20 MG/ML IV SOLN
INTRAVENOUS | Status: AC
  Filled 2014-08-01: qty 5

## 2014-08-01 MED ORDER — ROCURONIUM BROMIDE 100 MG/10ML IV SOLN
INTRAVENOUS | Status: DC | PRN
  Administered 2014-08-01: 50 mg via INTRAVENOUS

## 2014-08-01 MED ORDER — GLYCOPYRROLATE 0.2 MG/ML IJ SOLN
INTRAMUSCULAR | Status: AC
  Filled 2014-08-01: qty 3

## 2014-08-01 MED ORDER — MORPHINE SULFATE 4 MG/ML IJ SOLN
INTRAMUSCULAR | Status: DC | PRN
  Administered 2014-08-01: 4 mg via INTRAVENOUS

## 2014-08-01 MED ORDER — METOPROLOL TARTRATE 25 MG PO TABS
25.0000 mg | ORAL_TABLET | Freq: Two times a day (BID) | ORAL | Status: DC
  Administered 2014-08-02: 25 mg via ORAL
  Filled 2014-08-01 (×3): qty 1

## 2014-08-01 MED ORDER — SODIUM CHLORIDE 0.9 % IJ SOLN
INTRAMUSCULAR | Status: AC
  Filled 2014-08-01: qty 10

## 2014-08-01 MED ORDER — EPHEDRINE SULFATE 50 MG/ML IJ SOLN
INTRAMUSCULAR | Status: DC | PRN
  Administered 2014-08-01 (×3): 5 mg via INTRAVENOUS

## 2014-08-01 MED ORDER — CLONIDINE HCL (ANALGESIA) 100 MCG/ML EP SOLN
EPIDURAL | Status: DC | PRN
  Administered 2014-08-01: .7 mL via INTRA_ARTICULAR

## 2014-08-01 MED ORDER — MORPHINE SULFATE 4 MG/ML IJ SOLN
INTRAMUSCULAR | Status: AC
  Filled 2014-08-01: qty 1

## 2014-08-01 MED ORDER — OXYCODONE-ACETAMINOPHEN 5-325 MG PO TABS
2.0000 | ORAL_TABLET | ORAL | Status: DC | PRN
  Administered 2014-08-01 – 2014-08-02 (×5): 2 via ORAL
  Filled 2014-08-01 (×5): qty 2

## 2014-08-01 MED ORDER — FENTANYL CITRATE 0.05 MG/ML IJ SOLN
INTRAMUSCULAR | Status: DC | PRN
  Administered 2014-08-01: 50 ug via INTRAVENOUS
  Administered 2014-08-01: 100 ug via INTRAVENOUS
  Administered 2014-08-01: 50 ug via INTRAVENOUS

## 2014-08-01 MED ORDER — ONDANSETRON HCL 4 MG/2ML IJ SOLN
INTRAMUSCULAR | Status: AC
  Filled 2014-08-01: qty 2

## 2014-08-01 MED ORDER — FENTANYL CITRATE 0.05 MG/ML IJ SOLN
INTRAMUSCULAR | Status: AC
  Filled 2014-08-01: qty 5

## 2014-08-01 MED ORDER — CEFAZOLIN SODIUM-DEXTROSE 2-3 GM-% IV SOLR
INTRAVENOUS | Status: DC | PRN
  Administered 2014-08-01: 2 g via INTRAVENOUS

## 2014-08-01 MED ORDER — POTASSIUM CHLORIDE IN NACL 20-0.9 MEQ/L-% IV SOLN
INTRAVENOUS | Status: DC
  Administered 2014-08-01: 15:00:00 via INTRAVENOUS
  Filled 2014-08-01 (×3): qty 1000

## 2014-08-01 MED ORDER — PANTOPRAZOLE SODIUM 40 MG PO TBEC
40.0000 mg | DELAYED_RELEASE_TABLET | Freq: Every day | ORAL | Status: DC
  Administered 2014-08-02: 40 mg via ORAL
  Filled 2014-08-01: qty 1

## 2014-08-01 MED ORDER — CEFAZOLIN SODIUM-DEXTROSE 2-3 GM-% IV SOLR
2.0000 g | Freq: Four times a day (QID) | INTRAVENOUS | Status: AC
  Administered 2014-08-01 (×2): 2 g via INTRAVENOUS
  Filled 2014-08-01 (×2): qty 50

## 2014-08-01 MED ORDER — NEOSTIGMINE METHYLSULFATE 10 MG/10ML IV SOLN
INTRAVENOUS | Status: DC | PRN
  Administered 2014-08-01: 4 mg via INTRAVENOUS

## 2014-08-01 MED ORDER — ROCURONIUM BROMIDE 50 MG/5ML IV SOLN
INTRAVENOUS | Status: AC
  Filled 2014-08-01: qty 1

## 2014-08-01 MED ORDER — GLYCOPYRROLATE 0.2 MG/ML IJ SOLN
INTRAMUSCULAR | Status: DC | PRN
  Administered 2014-08-01: .6 mg via INTRAVENOUS

## 2014-08-01 MED ORDER — NEOSTIGMINE METHYLSULFATE 10 MG/10ML IV SOLN
INTRAVENOUS | Status: AC
  Filled 2014-08-01: qty 2

## 2014-08-01 MED ORDER — PHENYLEPHRINE 40 MCG/ML (10ML) SYRINGE FOR IV PUSH (FOR BLOOD PRESSURE SUPPORT)
PREFILLED_SYRINGE | INTRAVENOUS | Status: AC
  Filled 2014-08-01: qty 20

## 2014-08-01 MED ORDER — PHENYLEPHRINE HCL 10 MG/ML IJ SOLN
INTRAMUSCULAR | Status: AC
  Filled 2014-08-01: qty 1

## 2014-08-01 MED ORDER — ALPRAZOLAM 0.5 MG PO TABS: 0.5000 mg | ORAL_TABLET | Freq: Every evening | ORAL | Status: DC | PRN

## 2014-08-01 MED ORDER — MIDAZOLAM HCL 5 MG/5ML IJ SOLN
INTRAMUSCULAR | Status: DC | PRN
  Administered 2014-08-01: 2 mg via INTRAVENOUS

## 2014-08-01 MED ORDER — LIDOCAINE HCL (CARDIAC) 20 MG/ML IV SOLN
INTRAVENOUS | Status: DC | PRN
  Administered 2014-08-01: 60 mg via INTRAVENOUS

## 2014-08-01 MED ORDER — ASPIRIN 325 MG PO TABS
325.0000 mg | ORAL_TABLET | Freq: Every day | ORAL | Status: DC
  Administered 2014-08-01 – 2014-08-02 (×2): 325 mg via ORAL
  Filled 2014-08-01 (×2): qty 1

## 2014-08-01 MED ORDER — 0.9 % SODIUM CHLORIDE (POUR BTL) OPTIME
TOPICAL | Status: DC | PRN
  Administered 2014-08-01: 1000 mL

## 2014-08-01 MED ORDER — MIDAZOLAM HCL 2 MG/2ML IJ SOLN
INTRAMUSCULAR | Status: AC
  Filled 2014-08-01: qty 2

## 2014-08-01 MED ORDER — METOCLOPRAMIDE HCL 10 MG PO TABS: 5.0000 mg | ORAL_TABLET | Freq: Three times a day (TID) | ORAL | Status: DC | PRN

## 2014-08-01 MED ORDER — METOCLOPRAMIDE HCL 5 MG/ML IJ SOLN: 5.0000 mg | Freq: Three times a day (TID) | INTRAMUSCULAR | Status: DC | PRN

## 2014-08-01 MED ORDER — PROPOFOL 10 MG/ML IV BOLUS
INTRAVENOUS | Status: AC
  Filled 2014-08-01: qty 20

## 2014-08-01 MED ORDER — BUPIVACAINE HCL (PF) 0.25 % IJ SOLN
INTRAMUSCULAR | Status: DC | PRN
  Administered 2014-08-01: 30 mL

## 2014-08-01 MED ORDER — BUPIVACAINE HCL (PF) 0.25 % IJ SOLN
INTRAMUSCULAR | Status: AC
  Filled 2014-08-01: qty 30

## 2014-08-01 MED ORDER — PHENYLEPHRINE HCL 10 MG/ML IJ SOLN
10.0000 mg | INTRAMUSCULAR | Status: DC | PRN
  Administered 2014-08-01: 40 ug/min via INTRAVENOUS

## 2014-08-01 MED ORDER — HYDROMORPHONE HCL 1 MG/ML IJ SOLN
0.5000 mg | INTRAMUSCULAR | Status: DC | PRN
  Administered 2014-08-01 (×2): 0.5 mg via INTRAVENOUS

## 2014-08-01 MED ORDER — PROPOFOL 10 MG/ML IV BOLUS
INTRAVENOUS | Status: DC | PRN
  Administered 2014-08-01: 20 mg via INTRAVENOUS
  Administered 2014-08-01: 110 mg via INTRAVENOUS

## 2014-08-01 MED ORDER — HYDROMORPHONE HCL 1 MG/ML IJ SOLN
INTRAMUSCULAR | Status: AC
  Filled 2014-08-01: qty 1

## 2014-08-01 MED ORDER — PHENYLEPHRINE HCL 10 MG/ML IJ SOLN
INTRAMUSCULAR | Status: DC | PRN
  Administered 2014-08-01: 40 ug via INTRAVENOUS
  Administered 2014-08-01: 120 ug via INTRAVENOUS
  Administered 2014-08-01 (×2): 80 ug via INTRAVENOUS
  Administered 2014-08-01: 120 ug via INTRAVENOUS
  Administered 2014-08-01: 80 ug via INTRAVENOUS

## 2014-08-01 MED ORDER — ONDANSETRON HCL 4 MG PO TABS: 4.0000 mg | ORAL_TABLET | Freq: Four times a day (QID) | ORAL | Status: DC | PRN

## 2014-08-01 MED ORDER — PROPRANOLOL HCL 10 MG PO TABS
10.0000 mg | ORAL_TABLET | Freq: Three times a day (TID) | ORAL | Status: DC | PRN
  Filled 2014-08-01: qty 1

## 2014-08-01 MED ORDER — ONDANSETRON HCL 4 MG/2ML IJ SOLN
4.0000 mg | Freq: Four times a day (QID) | INTRAMUSCULAR | Status: DC | PRN
  Administered 2014-08-01: 4 mg via INTRAVENOUS
  Filled 2014-08-01: qty 2

## 2014-08-01 MED ORDER — CLONIDINE HCL (ANALGESIA) 100 MCG/ML EP SOLN
150.0000 ug | Freq: Once | EPIDURAL | Status: DC
  Filled 2014-08-01: qty 1.5

## 2014-08-01 SURGICAL SUPPLY — 75 items
ANCHOR BUTTON TIGHTROPE ACL RT (Orthopedic Implant) ×6 IMPLANT
BANDAGE ELASTIC 6 VELCRO ST LF (GAUZE/BANDAGES/DRESSINGS) ×3 IMPLANT
BANDAGE ESMARK 6X9 LF (GAUZE/BANDAGES/DRESSINGS) ×2 IMPLANT
BLADE CUDA 5.5 (BLADE) IMPLANT
BLADE CUTTER GATOR 3.5 (BLADE) ×3 IMPLANT
BLADE GREAT WHITE 4.2 (BLADE) ×3 IMPLANT
BLADE SURG 10 STRL SS (BLADE) ×3 IMPLANT
BLADE SURG 15 STRL LF DISP TIS (BLADE) ×4 IMPLANT
BLADE SURG 15 STRL SS (BLADE) ×2
BNDG ELASTIC 6X15 VLCR STRL LF (GAUZE/BANDAGES/DRESSINGS) ×3 IMPLANT
BNDG ESMARK 6X9 LF (GAUZE/BANDAGES/DRESSINGS) ×3
BONE MATRIX DEMINERALIZED 1CC (Bone Implant) ×6 IMPLANT
BUR OVAL 6.0 (BURR) ×3 IMPLANT
COVER SURGICAL LIGHT HANDLE (MISCELLANEOUS) ×3 IMPLANT
CUFF TOURNIQUET SINGLE 34IN LL (TOURNIQUET CUFF) ×3 IMPLANT
CUFF TOURNIQUET SINGLE 44IN (TOURNIQUET CUFF) IMPLANT
CUTTER FLIP II 9.5MM (INSTRUMENTS) ×3 IMPLANT
DECANTER SPIKE VIAL GLASS SM (MISCELLANEOUS) ×3 IMPLANT
DRAPE ARTHROSCOPY W/POUCH 114 (DRAPES) ×3 IMPLANT
DRAPE INCISE IOBAN 66X45 STRL (DRAPES) ×3 IMPLANT
DRAPE U-SHAPE 47X51 STRL (DRAPES) ×3 IMPLANT
DRSG PAD ABDOMINAL 8X10 ST (GAUZE/BANDAGES/DRESSINGS) ×3 IMPLANT
DURAPREP 26ML APPLICATOR (WOUND CARE) ×6 IMPLANT
ELECT REM PT RETURN 9FT ADLT (ELECTROSURGICAL) ×3
ELECTRODE REM PT RTRN 9FT ADLT (ELECTROSURGICAL) ×2 IMPLANT
FIBERSTICK 2 (SUTURE) ×3 IMPLANT
GAUZE SPONGE 4X4 12PLY STRL (GAUZE/BANDAGES/DRESSINGS) ×3 IMPLANT
GAUZE XEROFORM 1X8 LF (GAUZE/BANDAGES/DRESSINGS) ×3 IMPLANT
GLOVE BIOGEL PI IND STRL 7.5 (GLOVE) ×2 IMPLANT
GLOVE BIOGEL PI IND STRL 8 (GLOVE) ×2 IMPLANT
GLOVE BIOGEL PI INDICATOR 7.5 (GLOVE) ×1
GLOVE BIOGEL PI INDICATOR 8 (GLOVE) ×1
GLOVE ECLIPSE 7.0 STRL STRAW (GLOVE) ×3 IMPLANT
GLOVE SURG ORTHO 8.0 STRL STRW (GLOVE) ×3 IMPLANT
GOWN STRL REUS W/ TWL LRG LVL3 (GOWN DISPOSABLE) ×8 IMPLANT
GOWN STRL REUS W/TWL LRG LVL3 (GOWN DISPOSABLE) ×4
IMMOBILIZER KNEE 22 UNIV (SOFTGOODS) ×3 IMPLANT
KIT BASIN OR (CUSTOM PROCEDURE TRAY) ×3 IMPLANT
KIT BIOCARTILAGE DEL W/SYRINGE (KITS) ×3 IMPLANT
KIT ROOM TURNOVER OR (KITS) ×3 IMPLANT
MANIFOLD NEPTUNE II (INSTRUMENTS) ×3 IMPLANT
NEEDLE 18GX1X1/2 (RX/OR ONLY) (NEEDLE) ×3 IMPLANT
NS IRRIG 1000ML POUR BTL (IV SOLUTION) ×3 IMPLANT
PACK ARTHROSCOPY DSU (CUSTOM PROCEDURE TRAY) ×3 IMPLANT
PAD ARMBOARD 7.5X6 YLW CONV (MISCELLANEOUS) ×6 IMPLANT
PAD CAST 4YDX4 CTTN HI CHSV (CAST SUPPLIES) ×2 IMPLANT
PADDING CAST COTTON 4X4 STRL (CAST SUPPLIES) ×1
PADDING CAST COTTON 6X4 STRL (CAST SUPPLIES) ×3 IMPLANT
PENCIL BUTTON HOLSTER BLD 10FT (ELECTRODE) ×3 IMPLANT
SET ARTHROSCOPY TUBING (MISCELLANEOUS) ×1
SET ARTHROSCOPY TUBING LN (MISCELLANEOUS) ×2 IMPLANT
SPONGE LAP 4X18 X RAY DECT (DISPOSABLE) ×6 IMPLANT
SPONGE SCRUB IODOPHOR (GAUZE/BANDAGES/DRESSINGS) ×3 IMPLANT
STRIP CLOSURE SKIN 1/2X4 (GAUZE/BANDAGES/DRESSINGS) ×3 IMPLANT
SUCTION FRAZIER TIP 10 FR DISP (SUCTIONS) ×3 IMPLANT
SUT 2 FIBERLOOP 20 STRT BLUE (SUTURE) ×6
SUT ETHILON 3 0 PS 1 (SUTURE) ×6 IMPLANT
SUT FIBERWIRE #2 38 T-5 BLUE (SUTURE) ×3
SUT PROLENE 3 0 PS 2 (SUTURE) ×3 IMPLANT
SUT VIC AB 0 CT1 27 (SUTURE) ×1
SUT VIC AB 0 CT1 27XBRD ANBCTR (SUTURE) ×2 IMPLANT
SUT VIC AB 2-0 CT1 27 (SUTURE) ×1
SUT VIC AB 2-0 CT1 TAPERPNT 27 (SUTURE) ×2 IMPLANT
SUTURE 2 FIBERLOOP 20 STRT BLU (SUTURE) ×4 IMPLANT
SUTURE FIBERWR #2 38 T-5 BLUE (SUTURE) ×2 IMPLANT
SUTURE TIGERSTICK 2 TIGERWIR 2 (MISCELLANEOUS) ×2 IMPLANT
SYR 30ML LL (SYRINGE) ×3 IMPLANT
SYR BULB IRRIGATION 50ML (SYRINGE) ×3 IMPLANT
SYR TB 1ML LUER SLIP (SYRINGE) ×3 IMPLANT
TIGERSTICK 2 TIGERWIRE 2 (MISCELLANEOUS) ×3
TOWEL OR 17X24 6PK STRL BLUE (TOWEL DISPOSABLE) ×3 IMPLANT
TOWEL OR 17X26 10 PK STRL BLUE (TOWEL DISPOSABLE) ×3 IMPLANT
UNDERPAD 30X30 INCONTINENT (UNDERPADS AND DIAPERS) ×3 IMPLANT
WAND HAND CNTRL MULTIVAC 90 (MISCELLANEOUS) ×3 IMPLANT
WATER STERILE IRR 1000ML POUR (IV SOLUTION) ×3 IMPLANT

## 2014-08-01 NOTE — Evaluation (Signed)
Physical Therapy Evaluation Patient Details Name: Frank [name] MRN: [id number] DOB: 1966/02/03 Today's Date: 08/01/2014   History of Present Illness  Pt is a 48 y/o male admitted s/p R ACL reconstruction and hamstring graft. Pt is WBAT on the right with the knee immobilizer donned.   Clinical Impression  This patient presents with acute pain and decreased functional independence following the above mentioned procedure. At the time of PT eval, pt was able to perform transfers with supervision and ambulation with min guard. Frequent cueing for sequencing and safety; pt appeared to have difficulty with correct sequencing with crutches while also weight bearing through the operative LE. RW may be more appropriate for sequencing, WBAT status, and stair negotiation depending on progress with PT next session. Crutches used today per pt request. This patient is appropriate for skilled PT interventions to address functional limitations, improve safety and independence with functional mobility, and return to PLOF.      Follow Up Recommendations Outpatient PT (When appropriate per post-op protocol)    Equipment Recommendations  Crutches;Rolling walker with 5" wheels (Depending on what pt needs next session)    Recommendations for Other Services       Precautions / Restrictions Precautions Precautions: Fall Required Braces or Orthoses: Knee Immobilizer - Right Knee Immobilizer - Right: On when out of bed or walking Restrictions Weight Bearing Restrictions: Yes RLE Weight Bearing: Weight bearing as tolerated      Mobility  Bed Mobility Overal bed mobility: Needs Assistance Bed Mobility: Supine to Sit     Supine to sit: Supervision     General bed mobility comments: Supervision for safety. VC's for sequencing and technique.   Transfers Overall transfer level: Needs assistance Equipment used: Crutches Transfers: Sit to/from Stand Sit to Stand: Min guard         General  transfer comment: VC's for WBAT status on the R, and safety with the crutches. Pt demonstrated good balance.   Ambulation/Gait Ambulation/Gait assistance: Min guard Ambulation Distance (Feet): 60 Feet Assistive device: Crutches Gait Pattern/deviations: Step-to pattern;Decreased stride length;Decreased weight shift to right;Trunk flexed Gait velocity: Decreased Gait velocity interpretation: Below normal speed for age/gender General Gait Details: Pt required increased cueing for correct sequencing with the crutches. Encouraged WBAT status instead of swing-through gait pattern that pt associates with crutch use. Pt became nauseated with gait training and pt was returned to room.    Stairs            Wheelchair Mobility    Modified Rankin (Stroke Patients Only)       Balance Overall balance assessment: No apparent balance deficits (not formally assessed)                                           Pertinent Vitals/Pain Pain Assessment: 0-10 Pain Score: 1  Pain Location: R knee Pain Descriptors / Indicators: Guarding;Grimacing Pain Intervention(s): Limited activity within patient's tolerance;Monitored during session;Repositioned    Home Living Family/patient expects to be discharged to:: Private residence Living Arrangements: Spouse/significant other Available Help at Discharge: Family;Available 24 hours/day Type of Home: House Home Access: Stairs to enter Entrance Stairs-Rails: Right Entrance Stairs-Number of Steps: 3 Home Layout: One level Home Equipment: Shower seat - built in      Prior Function Level of Independence: Independent               Hand Dominance  Dominant Hand: Right    Extremity/Trunk Assessment   Upper Extremity Assessment: Defer to OT evaluation           Lower Extremity Assessment: RLE deficits/detail RLE Deficits / Details: Decreased strength and AROM consistent with ACL reconstruction.     Cervical / Trunk  Assessment: Normal  Communication   Communication: No difficulties  Cognition Arousal/Alertness: Awake/alert Behavior During Therapy: WFL for tasks assessed/performed Overall Cognitive Status: Within Functional Limits for tasks assessed                      General Comments      Exercises        Assessment/Plan    PT Assessment Patient needs continued PT services  PT Diagnosis Difficulty walking;Acute pain   PT Problem List Decreased strength;Decreased range of motion;Decreased activity tolerance;Decreased balance;Decreased mobility;Decreased knowledge of use of DME;Decreased safety awareness;Decreased knowledge of precautions;Pain  PT Treatment Interventions DME instruction;Gait training;Stair training;Functional mobility training;Therapeutic activities;Therapeutic exercise;Neuromuscular re-education;Patient/family education   PT Goals (Current goals can be found in the Care Plan section) Acute Rehab PT Goals Patient Stated Goal: Return home and increase independence PT Goal Formulation: With patient/family Time For Goal Achievement: 08/08/14 Potential to Achieve Goals: Good    Frequency Min 5X/week   Barriers to discharge        Co-evaluation               End of Session Equipment Utilized During Treatment: Gait belt Activity Tolerance: Treatment limited secondary to medical complications (Comment) (Nauseated) Patient left: in chair;with call bell/phone within reach;with family/visitor present Nurse Communication: Mobility status    Functional Assessment Tool Used: Clinical judgement Functional Limitation: Mobility: Walking and moving around Mobility: Walking and Moving Around Current Status [phone]): At least 20 percent but less than 40 percent impaired, limited or restricted Mobility: Walking and Moving Around Goal Status [phone]): At least 20 percent but less than 40 percent impaired, limited or restricted    Time: 0932-6712 PT Time Calculation  (min) (ACUTE ONLY): 33 min   Charges:   PT Evaluation $Initial PT Evaluation Tier I: 1 Procedure PT Treatments $Gait Training: 8-22 mins $Therapeutic Activity: 8-22 mins   PT G Codes:   Functional Assessment Tool Used: Clinical judgement Functional Limitation: Mobility: Walking and moving around    Rolinda Roan 08/01/2014, 5:29 PM   Rolinda Roan, PT, DPT Acute Rehabilitation Services Pager: [phone]

## 2014-08-01 NOTE — Brief Op Note (Signed)
08/01/2014  10:59 AM  PATIENT:  Stephannie Li Wirt  48 y.o. male  PRE-OPERATIVE DIAGNOSIS:  RIGHT KNEE ACL TEAR  POST-OPERATIVE DIAGNOSIS:  RIGHT KNEE ACL TEAR  PROCEDURE:  Procedure(s): RECONSTRUCTION ANTERIOR CRUCIATE LIGAMENT (ACL) WITH HAMSTRING GRAFT, MENISCAL DEBRIDEMENT AS NEEDED. TRANSESOPHAGEAL ECHOCARDIOGRAM (TEE)  SURGEON:  Surgeon(s): Meredith Pel, MD  ASSISTANT: Madelyn Brunner pa  ANESTHESIA:   general  EBL:  30 ml    Total I/O In: 2000 [I.V.:2000] Out: 25 [Blood:25]  BLOOD ADMINISTERED: none  DRAINS: none   LOCAL MEDICATIONS USED: marcaine ms04 clonidine  SPECIMEN:  No Specimen  COUNTS:  YES  TOURNIQUET:   Total Tourniquet Time Documented: Thigh (Right) - 106 minutes Total: Thigh (Right) - 106 minutes   DICTATION: .Other Dictation: Dictation Number [id number]  PLAN OF CARE: Admit for overnight observation  PATIENT DISPOSITION:  PACU - hemodynamically stable

## 2014-08-01 NOTE — Anesthesia Postprocedure Evaluation (Signed)
  Anesthesia Post-op Note  Patient: Frank [name]  Procedure(s) Performed: Procedure(s): RECONSTRUCTION ANTERIOR CRUCIATE LIGAMENT (ACL) WITH HAMSTRING GRAFT, MENISCAL DEBRIDEMENT AS NEEDED. (Right) TRANSESOPHAGEAL ECHOCARDIOGRAM (TEE) (N/A)  Patient Location: PACU  Anesthesia Type:General  Level of Consciousness: awake, alert  and oriented  Airway and Oxygen Therapy: Patient Spontanous Breathing and Patient connected to nasal cannula oxygen  Post-op Pain: mild  Post-op Assessment: Post-op Vital signs reviewed, Patient's Cardiovascular Status Stable, Respiratory Function Stable, Patent Airway, No signs of Nausea or vomiting and Pain level controlled  Post-op Vital Signs: Reviewed and stable  Last Vitals:  Filed Vitals:   08/01/14 1425  BP: 106/58  Pulse: 58  Temp: 36.5 C  Resp: 13    Complications: No apparent anesthesia complications

## 2014-08-01 NOTE — Progress Notes (Signed)
Cardiology at bedside.

## 2014-08-01 NOTE — Progress Notes (Signed)
Orthopedic Tech Progress Note Patient Details:  Frank [name] [facility] 17-Jul-1966 [id number] CPM applied to RLE with appropriate settings. OHF applied to bed. CPM Right Knee CPM Right Knee: On Right Knee Flexion (Degrees): 50 Right Knee Extension (Degrees): 0   Asia R Thompson 08/01/2014, 1:48 PM

## 2014-08-01 NOTE — Transfer of Care (Signed)
Immediate Anesthesia Transfer of Care Note  Patient: Frank [name]  Procedure(s) Performed: Procedure(s): RECONSTRUCTION ANTERIOR CRUCIATE LIGAMENT (ACL) WITH HAMSTRING GRAFT, MENISCAL DEBRIDEMENT AS NEEDED. (Right) TRANSESOPHAGEAL ECHOCARDIOGRAM (TEE) (N/A)  Patient Location: PACU  Anesthesia Type:General  Level of Consciousness: awake and alert   Airway & Oxygen Therapy: Patient Spontanous Breathing and Patient connected to nasal cannula oxygen  Post-op Assessment: Report given to PACU RN and Post -op Vital signs reviewed and stable  Post vital signs: Reviewed and stable  Complications: No apparent anesthesia complications

## 2014-08-01 NOTE — Anesthesia Preprocedure Evaluation (Addendum)
Anesthesia Evaluation  Patient identified by MRN, date of birth, ID band Patient awake    Reviewed: Allergy & Precautions, H&P , NPO status , Patient's Chart, lab work & pertinent test results  History of Anesthesia Complications (+) PONV and history of anesthetic complications  Airway Mallampati: I  TM Distance: >3 FB Neck ROM: Full    Dental  (+) Teeth Intact   Pulmonary neg pulmonary ROS,  breath sounds clear to auscultation        Cardiovascular hypertension, Pt. on home beta blockers - angina- Past MI + dysrhythmias + Valvular Problems/Murmurs AS Rhythm:Regular + Systolic murmurs    Neuro/Psych negative neurological ROS  negative psych ROS   GI/Hepatic Neg liver ROS, GERD-  Medicated and Controlled,  Endo/Other  negative endocrine ROS  Renal/GU negative Renal ROS     Musculoskeletal Right acl tear   Abdominal   Peds  Hematology negative hematology ROS (+)   Anesthesia Other Findings   Reproductive/Obstetrics                            Anesthesia Physical Anesthesia Plan  ASA: III  Anesthesia Plan: General and Regional   Post-op Pain Management:    Induction: Intravenous  Airway Management Planned: Oral ETT  Additional Equipment: None  Intra-op Plan:   Post-operative Plan: Extubation in OR  Informed Consent: I have reviewed the patients History and Physical, chart, labs and discussed the procedure including the risks, benefits and alternatives for the proposed anesthesia with the patient or authorized representative who has indicated his/her understanding and acceptance.   Dental advisory given  Plan Discussed with: CRNA and Surgeon  Anesthesia Plan Comments:        Anesthesia Quick Evaluation

## 2014-08-01 NOTE — H&P (Signed)
Frank [name] is an 48 y.o. male.   Chief Complaint: Right knee instability HPI: Frank [name] is a 48 year old patient long history of right knee instability. Had an injury several years ago has had symptomatic instability since that time. Recently he was coming down off a ladder and had another episode of instability which gave him significant pain and swelling. He presents now for operative management of known anterior cruciate ligament deficiency. MRI scanning shows anterior cruciate ligament tear intact menisci and some early chondral thinning on the medial surface.  Past Medical History  Diagnosis Date  . Congenital heart valve abnormality     bicuspid aortic valve  . PONV (postoperative nausea and vomiting)     after a tonsillectomy..none since  . Hypertension   . Dysrhythmia     PVC's  . Hypercholesterolemia     Past Surgical History  Procedure Laterality Date  . Inguinal hernia repair  2006    left, Dr. Bary Castilla  . Tonsillectomy  2001    Family History  Problem Relation Age of Onset  . Arthritis Mother     psoriatis, Crohn's   . Heart disease Father     CAD in his early 74s   Social History:  reports that he has never smoked. He has never used smokeless tobacco. He reports that he drinks about 4.2 oz of alcohol per week. He reports that he does not use illicit drugs.  Allergies: No Known Allergies  Medications Prior to Admission  Medication Sig Dispense Refill  . ALPRAZolam (XANAX) 0.5 MG tablet Take 1 tablet (0.5 mg total) by mouth at bedtime as needed. 90 tablet 0  . Inositol Niacinate (NIACIN FLUSH FREE) 500 MG CAPS Take 1,000 mg by mouth daily. Take 2 by mouth daily    . metoprolol tartrate (LOPRESSOR) 25 MG tablet Take 25 mg by mouth 2 (two) times daily.    Marland Kitchen omeprazole (PRILOSEC) 40 MG capsule Take 1 capsule by mouth  daily 90 capsule 1  . Red Yeast Rice 600 MG CAPS Take 1,200 mg by mouth daily. Take two by mouth twice a day.    . propranolol (INDERAL) 10 MG tablet  Take 1 tablet (10 mg total) by mouth 3 (three) times daily. As needed for palpitations 30 tablet 0    No results found for this or any previous visit (from the past 48 hour(s)). No results found.  Review of Systems  Constitutional: Negative.   HENT: Negative.   Eyes: Negative.   Respiratory: Negative.   Cardiovascular: Negative.   Gastrointestinal: Negative.   Genitourinary: Negative.   Musculoskeletal: Positive for joint pain.  Skin: Negative.   Neurological: Negative.   Endo/Heme/Allergies: Negative.   Psychiatric/Behavioral: Negative.     Blood pressure 135/68, pulse 77, temperature 97.5 F (36.4 C), temperature source Oral, resp. rate 18, height 5\' 10"  (1.778 m), weight 81.222 kg (179 lb 1 oz), SpO2 98 %. Physical Exam  Constitutional: He appears well-developed.  HENT:  Head: Normocephalic.  Eyes: Pupils are equal, round, and reactive to light.  Neck: Normal range of motion.  Cardiovascular: Normal rate.   Respiratory: Effort normal.  Neurological: He is alert.  Skin: Skin is warm.  Psychiatric: He has a normal mood and affect.   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

## 2014-08-01 NOTE — Progress Notes (Signed)
Dr. Moser at bedside.

## 2014-08-01 NOTE — Consult Note (Signed)
CARDIOLOGY CONSULT NOTE   Patient ID: Frank [name] MRN: [id number], DOB/AGE: [birth year]   Admit date: 08/01/2014 Date of Consult: 08/01/2014   Primary Physician: Deborra Medina, MD Primary Cardiologist: Dr. [name] at [facility] in [place]  Pt. Profile  48 year old male with past medical history of hypertension, hyperlipidemia, and congenital bicuspid aortic valve present for R anterior cruciate ligament repair on 12/15, intraoperative TEE obtained by Anesthesiology showed severe AS with moderate to severe AI, moderate MR/TR and LVH. Cardiology consulted for severe AS  Problem List  Past Medical History  Diagnosis Date  . Congenital heart valve abnormality     bicuspid aortic valve  . PONV (postoperative nausea and vomiting)     after a tonsillectomy..none since  . Hypertension   . Dysrhythmia     PVC's  . Hypercholesterolemia     Past Surgical History  Procedure Laterality Date  . Inguinal hernia repair  2006    left, Dr. Bary Castilla  . Tonsillectomy  2001     Allergies  Allergies  Allergen Reactions  . Statins Other (See Comments)    Muscle ache    HPI   The patient is a 48 year old male with past medical history of hypertension, hyperlipidemia, and congenital bicuspid aortic valve. He's intolerant to statin in the past due to muscle ache. Patient has been followed up by Dr. [name] in [facility] in [place] for the past several years. According to him he has known about the valvular problem for a very long time. Dr. [name] was thinking about potential surgical consult last year, however he had a repeat echocardiogram in March 2015 which showed EF 50%, mild to moderate aortic stenosis with mild aortic insufficiency, mild mitral regurg, mild tricuspid regurg. He was told his echocardiogram looked okay at that time and if the valvular problem worsen, he will need a surgical consult down the line. According to the patient, he has stayed active. 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 He denies any prior history of dizziness, chest pain, or presyncope with exertion. He does have occasional acid reflux symptom which he described as a burning sensation in the chest when he tried to exercise after a full meal. However he does not have the same sensation when his stomach is empty even with exertion.  Years ago, patient injured his right knee and has been dealing with some degree of instability. In September of this year, while climbing down a ladder, he miscalculated the steps, and sprained his right knee resulting knee dislocation. He was seen by his primary cardiologist in October who is aware of his upcoming knee surgeries. At that time, patient also has some palpitation that started shortly after her September knee injury. Holter monitor placed by his primary cardiologist showed PVCs, otherwise no significant arrhythmia. He was placed on propranolol by his PCP for palpitation, however he was never started on propranolol. His cardiologist at [facility] clinic have instructed the patient not to take the propranolol and instead started him on metoprolol perioperatively. Patient underwent scheduled right anterior cruciate ligament repair on 08/01/2014. He did okay intraoperatively. TEE done by anesthesiology intraoperatively showed patient has mean transaortic valvular gradient 41 mmHg, valve area roughly 0.67, moderate to severe AI on top of severe AS, LVH, moderate MR/TR. Cardiology has been consulted for severe aortic stenosis.  Inpatient Medications  . chlorhexidine  60 mL Topical Once  . chlorhexidine  60 mL Topical Once  .  cloNIDine  150 mcg Intra-articular Once  . HYDROmorphone        Family History Family History  Problem Relation Age of Onset  . Arthritis Mother     psoriatis, Crohn's   . Heart disease Father     CAD in his early 44s     Social History History   Social History    . Marital Status: Married    Spouse Name: N/A    Number of Children: N/A  . Years of Education: N/A   Occupational History  . 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 Social research officer, government    Social History Main Topics  . Smoking status: Never Smoker   . Smokeless tobacco: Never Used  . Alcohol Use: 4.2 oz/week    7 Glasses of wine per week     Comment: occasional, drink 1 beer per wk  . Drug Use: No  . Sexual Activity: Yes   Other Topics Concern  . Not on file   Social History Narrative   Pt has a dog.   Exercises regularly- isometrics, no running.     Review of Systems  General:  No chills, fever, night sweats or weight changes. R knee instability Cardiovascular:  No chest pain, dyspnea on exertion, edema, orthopnea, palpitations, paroxysmal nocturnal dyspnea. +burning sensation when exercising after meal Dermatological: No rash, lesions/masses Respiratory: No cough, dyspnea Urologic: No hematuria, dysuria Abdominal:   No nausea, vomiting, diarrhea, bright red blood per rectum, melena, or hematemesis Neurologic:  No visual changes, wkns, changes in mental status. All other systems reviewed and are otherwise negative except as noted above.  Physical Exam  Blood pressure 99/60, pulse 73, temperature 97.7 F (36.5 C), temperature source Oral, resp. rate 9, height 5\' 10"  (1.778 m), weight 179 lb 1 oz (81.222 kg), SpO2 96 %.  General: Pleasant, NAD Psych: Normal affect. Neuro: Alert and oriented X 3. Moves all extremities spontaneously. HEENT: Normal  Neck: Supple without bruits or JVD. Lungs:  Resp regular and unlabored, anterior exam CTA. Heart: RRR no s3, s4. 4/6 systolic murmur at R upper sternal border Abdomen: Soft, non-tender, non-distended, BS + x 4.  Extremities: No clubbing, cyanosis or edema. DP/PT/Radials 2+ and equal bilaterally.  Labs  No results for input(s): CKTOTAL, CKMB, TROPONINI in the last 72 hours. Lab Results  Component Value  Date   WBC 7.6 07/24/2014   HGB 14.8 07/24/2014   HCT 43.2 07/24/2014   MCV 84.2 07/24/2014   PLT 221 07/24/2014   No results for input(s): NA, K, CL, CO2, BUN, CREATININE, CALCIUM, PROT, BILITOT, ALKPHOS, ALT, AST, GLUCOSE in the last 168 hours.  Invalid input(s): LABALBU Lab Results  Component Value Date   CHOL 270* 05/12/2014   HDL 40 05/12/2014   LDLCALC 175 05/12/2014   TRIG 274* 05/12/2014   No results found for: DDIMER  Radiology/Studies  No results found.  ECG  No new EKG, last EKG 07/24/2014 TWI in V4-V6 and inferior lead  ASSESSMENT AND PLAN  1. Severe aortic stenosis with h/o congenital bicuspid aortic valve  - final TEE report pending, will ask one of the cardiology imager to take a look as well  - will likely need surgical consult, however currently asymptomatic, ?if should allow patient to followup with his cardiologist as outpatient vs obtain surgical consult inpatient. Will decide once review TEE final report. May need L and R heart cath at some point as well.  2. HTN 3. HLD 4. Palpitations:  holter monitor done by patient's primary cardiologist shows PVCs  - he was placed on metoprolol perioperatively   Signed, Almyra Deforest, PA-C 08/01/2014, 1:56 PM

## 2014-08-02 ENCOUNTER — Encounter (HOSPITAL_COMMUNITY): Payer: Self-pay | Admitting: Orthopedic Surgery

## 2014-08-02 ENCOUNTER — Other Ambulatory Visit: Payer: Self-pay | Admitting: Cardiology

## 2014-08-02 DIAGNOSIS — S83511D Sprain of anterior cruciate ligament of right knee, subsequent encounter: Secondary | ICD-10-CM | POA: Diagnosis not present

## 2014-08-02 DIAGNOSIS — I350 Nonrheumatic aortic (valve) stenosis: Secondary | ICD-10-CM

## 2014-08-02 MED ORDER — METHOCARBAMOL 500 MG PO TABS: 500.0000 mg | ORAL_TABLET | Freq: Four times a day (QID) | ORAL | Status: DC

## 2014-08-02 MED ORDER — OXYCODONE-ACETAMINOPHEN 5-325 MG PO TABS: 2.0000 | ORAL_TABLET | ORAL | Status: DC | PRN

## 2014-08-02 MED ORDER — ASPIRIN 325 MG PO TABS: 325.0000 mg | ORAL_TABLET | Freq: Every day | ORAL | Status: DC

## 2014-08-02 NOTE — Progress Notes (Signed)
Pt stable Plan dc today foot perfused and sensate

## 2014-08-02 NOTE — Op Note (Signed)
NAMEKIERAN, ARREGUIN NO.:  [id number]  MEDICAL RECORD NO.:  [id number]  LOCATION:  5N07C                        FACILITY:  [facility]  PHYSICIAN:  Anderson Malta, M.D.    DATE OF BIRTH:  Dec 10, 1965  DATE OF PROCEDURE: DATE OF DISCHARGE:                              OPERATIVE REPORT   PREOPERATIVE DIAGNOSIS:  Right knee anterior cruciate ligament tear.  POSTOPERATIVE DIAGNOSES:  Right knee anterior cruciate ligament tear, chondromalacia of medial femoral condyle.  PROCEDURE:  Right knee diagnostic arthroscopy with chondroplasty, medial femoral condyle, intraoperative transesophageal echo to evaluate aortic stenosis.  SURGEON:  Anderson Malta, M.D.  ASSISTANT:  Alyson Locket. Velora Heckler.  ANESTHESIA:  General.  INDICATIONS:  Frank [name] is a patient with right knee instability, presents for operative management after explanation of risks and benefits.  DESCRIPTION OF PROCEDURE:  The patient was brought to the operating room where general anesthesia was induced.  Preoperative antibiotics were administered.  Time-out was called.  Right leg examined under anesthesia, found to have ACL laxity, PCL intact.  No posterolateral rotatory instability was noted.  Range of motion was full.  Collaterals were stable at 0 and 30 degrees.  Following examination under anesthesia, knee was prescrubbed with alcohol and Betadine and allowed to dry, prepped with DuraPrep solution and draped in sterile manner. Time-out was called.  Left leg was elevated and exsanguinated with Esmarch wrap and tourniquet was inflated.  Total tourniquet time 1 hour 45 minutes at 300 mmHg.  Incision was made over the hamstring tendons just distal and medial to the tibial tubercle.  Skin and subcutaneous tissues were sharply divided.  Semitendinosus graft was harvested, prepared on the back table by Benjiman Core and Laure Kidney to a size 9.5 using dual EndoButton technique.  Concurrent anterior inferolateral and  anterior inferomedial ports were established.  Diagnostic arthroscopy was performed.  The patient did have grade 2-3 chondromalacia on the medial femoral condyle.  Some areas of full- thickness over about 50% of weightbearing surface area.  The lateral compartment was intact.  ACL was torn.  Patellofemoral compartment was intact.  ACL stump debrided.  Notchplasty performed.  The femoral tunnel was then placed using flip cutter at 9 o'clock position.  Tibial tunnel placed at the central to posterior aspect of the native ACL footprint graft, passed, and secured on the femoral side about 15-17 mm of graft on the tibial side secured with StimuBlast in full extension.  The patient had excellent knee stability.  Taken through full range of motion and found to have good stability.  At this time, tourniquet was released.  Bleeding points were encountered using electrocautery. Portals closed using combination of Vicryl and nylon suture.  The harvest site closed using Vicryl and Prolene.  Solution of Marcaine, morphine, clonidine injected into the knee.  Bulky dressing and knee immobilizer placed.  The patient tolerated the procedure well without immediate complications. Transferred to recovery room in stable condition.     Anderson Malta, M.D.     GSD/MEDQ  D:  08/01/2014  T:  08/02/2014  Job:  [id number]

## 2014-08-02 NOTE — Progress Notes (Signed)
Physical Therapy Treatment Patient Details Name: KETIH GOODIE MRN: [id number] DOB: 19-Jan-1966 Today's Date: 08/02/2014    History of Present Illness Pt is a 48 y/o male admitted s/p R ACL reconstruction and hamstring graft. Pt is WBAT on the right with the knee immobilizer donned.     PT Comments    Pt with improved mobility and was able to demo good crutch technique with amb and stair negotiation. Pt safe to d/c home with spouse once medically cleared.  Follow Up Recommendations  Outpatient PT     Equipment Recommendations  Crutches (spoke with RN and ortho tech)    Recommendations for Other Services       Precautions / Restrictions Precautions Precautions: Fall Required Braces or Orthoses: Knee Immobilizer - Right (re-fitted brace for optimal support) Knee Immobilizer - Right: On when out of bed or walking Restrictions Weight Bearing Restrictions: Yes RLE Weight Bearing: Weight bearing as tolerated    Mobility  Bed Mobility Overal bed mobility: Modified Independent Bed Mobility: Supine to Sit     Supine to sit: Modified independent (Device/Increase time)     General bed mobility comments: v/c's for long sit technique  Transfers Overall transfer level: Needs assistance Equipment used: Crutches Transfers: Sit to/from Stand Sit to Stand: Supervision         General transfer comment: v/c's for crutch management  Ambulation/Gait Ambulation/Gait assistance: Min guard Ambulation Distance (Feet): 100 Feet (x2 (to/from gym)) Assistive device: Crutches Gait Pattern/deviations: Step-to pattern Gait velocity: Decreased Gait velocity interpretation: Below normal speed for age/gender General Gait Details: v/c's initially for sequencing but much improved by end of session. no episodes of LOB   Stairs Stairs: Yes Stairs assistance: Min guard Stair Management: One rail Right;With crutches Number of Stairs: 6 General stair comments: pt with good demo of  technique "up with the good, down with the bad" and crutch management.  Wheelchair Mobility    Modified Rankin (Stroke Patients Only)       Balance Overall balance assessment:  (needs crutches for standing due to surgery)                                  Cognition Arousal/Alertness: Awake/alert Behavior During Therapy: WFL for tasks assessed/performed Overall Cognitive Status: Within Functional Limits for tasks assessed                      Exercises      General Comments        Pertinent Vitals/Pain Pain Assessment: 0-10 Pain Score: 1  Pain Location: r knee Pain Intervention(s): Monitored during session    Home Living                      Prior Function            PT Goals (current goals can now be found in the care plan section) Progress towards PT goals: Progressing toward goals    Frequency  Min 5X/week    PT Plan Current plan remains appropriate    Co-evaluation             End of Session Equipment Utilized During Treatment: Gait belt Activity Tolerance: Patient tolerated treatment well Patient left: in chair;with call bell/phone within reach;with family/visitor present     Time: 4431-5400 PT Time Calculation (min) (ACUTE ONLY): 29 min  Charges:  $Gait Training: 23-37 mins  G CodesKingsley Callander 08/02/2014, 10:53 AM   Kittie Plater, PT, DPT Pager #: [phone] Office #: [phone]

## 2014-08-02 NOTE — Progress Notes (Signed)
Orthopedic Tech Progress Note Patient Details:  Frank [name] [facility] 03/24/1966 [id number]  Ortho Devices Type of Ortho Device: Crutches Ortho Device/Splint Interventions: Application   Khiya Friese 08/02/2014, 10:02 AM

## 2014-08-02 NOTE — Progress Notes (Signed)
Orthopedic Tech Progress Note Patient Details:  SHALOM MCGUINESS Feb 15, 1966 [id number]  Patient ID: [name], male   DOB: [birth year], 48 y.o.   MRN: [id number] Viewed order from doctor's order list  Hildred Priest 08/02/2014, 10:02 AM

## 2014-08-26 NOTE — Discharge Summary (Signed)
Physician Discharge Summary  Patient ID: Frank [name] MRN: [id number] DOB/AGE: 1966/04/14 49 y.o.  Admit date: 08/01/2014 Discharge date: 08/02/2014  Admission Diagnoses:  Active Problems:   ACL tear   Severe aortic stenosis   Bicuspid aortic valve   Discharge Diagnoses:  Same  Surgeries: Procedure(s): RECONSTRUCTION ANTERIOR CRUCIATE LIGAMENT (ACL) WITH HAMSTRING GRAFT, MENISCAL DEBRIDEMENT AS NEEDED. TRANSESOPHAGEAL ECHOCARDIOGRAM (TEE) on 08/01/2014   Consultants: Treatment Team:  Rounding Lbcardiology, MD  Discharged Condition: Stable  Hospital Course: Frank [name] is an 49 y.o. male who was admitted 08/01/2014 with a chief complaint of right knee instability, and found to have a diagnosis of right anterior cruciate ligament tear.  They were brought to the operating room on 08/01/2014 and underwent the above named procedures.  He tolerated the knee procedure well. Preoperative assessment demonstrated concern for aortic stenosis based on physical exam. Intraoperative trans-thoracic echocardiogram was performed which did confirm significant aortic stenosis. Cardiology consultation was obtained all he was in the hospital and follow-up has been arranged. Plan is for weightbearing as tolerated with standard anterior cruciate ligament rehabilitation protocol initiated. This includes weight-bearing leg strengthening exercises and range of motion exercises he will follow-up with me in 7 days for suture removal  Antibiotics given:  Anti-infectives    Start     Dose/Rate Route Frequency Ordered Stop   08/01/14 1445  ceFAZolin (ANCEF) IVPB 2 g/50 mL premix     2 g100 mL/hr over 30 Minutes Intravenous Every 6 hours 08/01/14 1428 08/01/14 2313   08/01/14 0600  ceFAZolin (ANCEF) IVPB 2 g/50 mL premix     2 g100 mL/hr over 30 Minutes Intravenous On call to O.R. 07/31/14 1413 08/01/14 0823    .  Recent vital signs:  Filed Vitals:   08/02/14 0536  BP: 106/59  Pulse: 85  Temp:  98.4 F (36.9 C)  Resp: 17    Recent laboratory studies:  Results for orders placed or performed during the hospital encounter of 07/24/14  CBC  Result Value Ref Range   WBC 7.6 4.0 - 10.5 K/uL   RBC 5.13 4.22 - 5.81 MIL/uL   Hemoglobin 14.8 13.0 - 17.0 g/dL   HCT 43.2 39.0 - 52.0 %   MCV 84.2 78.0 - 100.0 fL   MCH 28.8 26.0 - 34.0 pg   MCHC 34.3 30.0 - 36.0 g/dL   RDW 14.2 11.5 - 15.5 %   Platelets 221 150 - 400 K/uL  Basic metabolic panel  Result Value Ref Range   Sodium 139 137 - 147 mEq/L   Potassium 4.5 3.7 - 5.3 mEq/L   Chloride 101 96 - 112 mEq/L   CO2 26 19 - 32 mEq/L   Glucose, Bld 95 70 - 99 mg/dL   BUN 21 6 - 23 mg/dL   Creatinine, Ser 1.13 0.50 - 1.35 mg/dL   Calcium 9.9 8.4 - 10.5 mg/dL   GFR calc non Af Amer 75 (L) >90 mL/min   GFR calc Af Amer 87 (L) >90 mL/min   Anion gap 12 5 - 15    Discharge Medications:     Medication List    TAKE these medications        ALPRAZolam 0.5 MG tablet  Commonly known as:  XANAX  Take 1 tablet (0.5 mg total) by mouth at bedtime as needed.     aspirin 325 MG tablet  Take 1 tablet (325 mg total) by mouth daily.     methocarbamol 500 MG tablet  Commonly known  as:  ROBAXIN  Take 1 tablet (500 mg total) by mouth 4 (four) times daily.     metoprolol tartrate 25 MG tablet  Commonly known as:  LOPRESSOR  Take 25 mg by mouth 2 (two) times daily.     NIACIN FLUSH FREE 500 MG Caps  Generic drug:  Inositol Niacinate  Take 1,000 mg by mouth daily. Take 2 by mouth daily     omeprazole 40 MG capsule  Commonly known as:  PRILOSEC  Take 1 capsule by mouth  daily     oxyCODONE-acetaminophen 5-325 MG per tablet  Commonly known as:  PERCOCET/ROXICET  Take 2 tablets by mouth every 4 (four) hours as needed for moderate pain.     propranolol 10 MG tablet  Commonly known as:  INDERAL  Take 1 tablet (10 mg total) by mouth 3 (three) times daily. As needed for palpitations     Red Yeast Rice 600 MG Caps  Take 1,200 mg by  mouth daily. Take two by mouth twice a day.        Diagnostic Studies: No results found.  Disposition: 01-Home or Self Care      Discharge Instructions    Call MD / Call 911    Complete by:  As directed   If you experience chest pain or shortness of breath, CALL 911 and be transported to the hospital emergency room.  If you develope a fever above 101 F, pus (white drainage) or increased drainage or redness at the wound, or calf pain, call your surgeon's office.     Constipation Prevention    Complete by:  As directed   Drink plenty of fluids.  Prune juice may be helpful.  You may use a stool softener, such as Colace (over the counter) 100 mg twice a day.  Use MiraLax (over the counter) for constipation as needed.     Diet - low sodium heart healthy    Complete by:  As directed      Discharge instructions    Complete by:  As directed   CPM 3 hours per day minimum OK to remove dressing Saturday and cover portals with bandaids Work on full extension Walk with knee immobilizerl     Increase activity slowly as tolerated    Complete by:  As directed            Follow-up Information    Follow up with HILTY,Kenneth C, MD. Schedule an appointment as soon as possible for a visit in 1 month.   Specialty:  Cardiology   Why:  Cardiology follow-up of aortic stenosis   Contact information:   [place] [address] [phone]        Signed: Meredith Pel 08/26/2014, 10:06 AM

## 2014-08-28 ENCOUNTER — Inpatient Hospital Stay (HOSPITAL_COMMUNITY)
Admission: RE | Admit: 2014-08-28 | Discharge: 2014-08-28 | Disposition: A | Discharge status: Home / Self Care | Payer: 59 | Source: Ambulatory Visit | Attending: Cardiology | Admitting: Cardiology

## 2014-08-28 DIAGNOSIS — I350 Nonrheumatic aortic (valve) stenosis: Secondary | ICD-10-CM

## 2014-09-04 ENCOUNTER — Ambulatory Visit (HOSPITAL_COMMUNITY)
Admission: RE | Admit: 2014-09-04 | Discharge: 2014-09-04 | Disposition: A | Discharge status: Home / Self Care | Payer: 59 | Source: Ambulatory Visit | Attending: Cardiology | Admitting: Cardiology

## 2014-09-04 ENCOUNTER — Other Ambulatory Visit (HOSPITAL_COMMUNITY): Payer: Self-pay | Admitting: Cardiology

## 2014-09-04 DIAGNOSIS — R002 Palpitations: Secondary | ICD-10-CM | POA: Diagnosis not present

## 2014-09-04 DIAGNOSIS — I350 Nonrheumatic aortic (valve) stenosis: Secondary | ICD-10-CM | POA: Diagnosis not present

## 2014-09-04 DIAGNOSIS — I359 Nonrheumatic aortic valve disorder, unspecified: Secondary | ICD-10-CM

## 2014-09-04 NOTE — Progress Notes (Signed)
2D Echocardiogram Complete.  09/04/2014   Luma Clopper [place], [place]

## 2014-09-12 ENCOUNTER — Ambulatory Visit (INDEPENDENT_AMBULATORY_CARE_PROVIDER_SITE_OTHER): Payer: 59 | Admitting: Internal Medicine

## 2014-09-12 ENCOUNTER — Encounter: Payer: Self-pay | Admitting: Internal Medicine

## 2014-09-12 VITALS — Ht 69.5 in | Wt 176.6 lb

## 2014-09-12 DIAGNOSIS — Q231 Congenital insufficiency of aortic valve: Secondary | ICD-10-CM

## 2014-09-12 DIAGNOSIS — I350 Nonrheumatic aortic (valve) stenosis: Secondary | ICD-10-CM

## 2014-09-12 DIAGNOSIS — R0600 Dyspnea, unspecified: Secondary | ICD-10-CM

## 2014-09-12 DIAGNOSIS — R002 Palpitations: Secondary | ICD-10-CM

## 2014-09-12 NOTE — Patient Instructions (Signed)
Your physician has requested that you have a stress echocardiogram. For further information please visit [email or web address]. Please follow instruction sheet as given. >> this needs to be scheduled about [place] physician recommends that you schedule a follow-up appointment with Dr. Debara Pickett after your test.

## 2014-09-12 NOTE — Progress Notes (Signed)
OFFICE NOTE  Chief Complaint:  Hospital follow-up, sore in his knee  Primary Care Physician: [name], MD  HPI:  Frank [name] is a pleasant 49 yo male with a history of at least moderate AS (followed by Dr. [name] at the [facility]), presented for ACL repair with Dr. [name]. Preoperatively, he was evaluated by Dr. [name] (cardiac anesthesia) and found to have a loud AS murmur- After some discussion, interoperative TEE was recommended and performed. I personally reviewed the TEE images which do demonstrate severe aortic stenosis, moderate AI and mild to moderate MR (obtained during surgery, on pressor, PPV, etc, but still significant). He was recently told he probably had another 5 years before needing to consider surgery. Mean gradient is already above 40 mmHg. Clinically, he has been asymptomatic (no chest pain, dyspnea, syncope, etc,) but recently he has had more palpitations (PVC's) and was started on b-blocker. He did manage to get through surgery uneventfully.   He returns today in follow-up to the office. He continues to deny any chest pain, worsening shortness of breath, presyncope or syncopal episodes. He has not pushed himself significantly due to his recent knee surgery. He is undergoing rehabilitation and getting stronger. A repeat echocardiogram was just performed as an outpatient. This was initially read by one of my partners and indicated mild to moderate aortic stenosis based on a mean valve gradient in the 20s. However subsequent images during the study do indicate mean gradients greater than 50 from the right upper sternal border using Pedoff probe. I therefore personally reviewed the echocardiogram images and made the appropriate corrections,  and the results are as follows:  Study Conclusions  - Left ventricle: The cavity size was normal. Wall thickness was increased in a pattern of mild LVH. Systolic function was normal. The estimated ejection fraction  was in the range of 55% to 60%. - Aortic valve: The aortic valve is bicuspid and is heavily calcified with restricted leaflet motion. Peak and mean gradients through the valve are 100 and 59 mm Hg respectively, consistent with severe AS. The calculated AVA is 0.6 cm2. There is moderate, eccentric AI. There was mild regurgitation. - Aorta: Aortic root dimension: 39 mm (ED). - Aortic root: The aortic root is mildly dilated. - Mitral valve: Calcified annulus. Mildly thickened leaflets . There was mild regurgitation. - Left atrium: LA Volume/BSA= 46.4 ml/m2. The atrium is moderately dilated.  Impressions:  - LVEF 60-65%, moderate concentric LVH, bicuspid and severely stenotic aortic valve- AVA 0.6 cm2, peak and mean gradients of 100 mmHg and 59 mmHg, respectively - LVOT diameter measured at 2.4 cm. There is moderate AI. There is mild to moderate MR with restricted anterior leaflet motion secondary to eccentric AI.  Pedoff measurements do indeed show a very high mean gradient of 59 and peak gradient of 100 mmHg. Based on a generous LVOT diameter of 2.4 cm, the calculated aortic valve gradient was 0.6 cm consistent with severe aortic stenosis.  PMHx:  Past Medical History  Diagnosis Date  . Congenital heart valve abnormality     bicuspid aortic valve  . PONV (postoperative nausea and vomiting)     after a tonsillectomy..none since  . Hypertension   . Dysrhythmia     PVC's  . Hypercholesterolemia     Past Surgical History  Procedure Laterality Date  . Inguinal hernia repair  2006    left, Dr. Bary Castilla  . Tonsillectomy  2001  . Anterior cruciate ligament repair Right 08/01/2014  Procedure: RECONSTRUCTION ANTERIOR CRUCIATE LIGAMENT (ACL) WITH HAMSTRING GRAFT, MENISCAL DEBRIDEMENT AS NEEDED.;  Surgeon: Meredith Pel, MD;  Location: [place];  Service: Orthopedics;  Laterality: Right;  . Tee without cardioversion N/A 08/01/2014    Procedure: TRANSESOPHAGEAL  ECHOCARDIOGRAM (TEE);  Surgeon: Meredith Pel, MD;  Location: [place];  Service: Orthopedics;  Laterality: N/A;    FAMHx:  Family History  Problem Relation Age of Onset  . Arthritis Mother     psoriatis, Crohn's   . Heart disease Father     CAD in his early 63s    SOCHx:   reports that he has never smoked. He has never used smokeless tobacco. He reports that he drinks about 4.2 oz of alcohol per week. He reports that he does not use illicit drugs.  ALLERGIES:  Allergies  Allergen Reactions  . Statins Other (See Comments)    Muscle ache    ROS: A comprehensive review of systems was negative except for: Musculoskeletal: positive for stiff joints and knee pain  HOME MEDS: Current Outpatient Prescriptions  Medication Sig Dispense Refill  . ALPRAZolam (XANAX) 0.5 MG tablet Take 1 tablet (0.5 mg total) by mouth at bedtime as needed. 90 tablet 0  . Inositol Niacinate (NIACIN FLUSH FREE) 500 MG CAPS Take 1,000 mg by mouth daily. Take 2 by mouth daily    . omeprazole (PRILOSEC) 40 MG capsule Take 1 capsule by mouth  daily 90 capsule 1  . Red Yeast Rice 600 MG CAPS Take 1,200 mg by mouth daily. Take two by mouth twice a day.     No current facility-administered medications for this visit.    LABS/IMAGING: No results found for this or any previous visit (from the past 48 hour(s)). No results found.  VITALS: Ht 5' 9.5" (1.765 m)  Wt 176 lb 9.6 oz (80.105 kg)  BMI 25.71 kg/m2  EXAM: General appearance: alert and no distress Neck: no carotid bruit and no JVD Lungs: clear to auscultation bilaterally Heart: regular rate and rhythm, S1: normal, S2: decreased intensity, systolic murmur: late systolic 4/6, harsh and associated with a thrill at 2nd right intercostal space, diastolic murmur: mid diastolic 3/6, blowing at apex and there is a delayed carotid upstroke Abdomen: soft, non-tender; bowel sounds normal; no masses,  no organomegaly Extremities: extremities normal, atraumatic,  no cyanosis or edema Pulses: 2+ and symmetric Skin: Skin color, texture, turgor normal. No rashes or lesions Neurologic: Grossly normal Psych: Pleasant, normal mood, affect  EKG: deferred  ASSESSMENT: 1. Severe calcific aortic stenosis of a bicuspid valve-peak and mean gradients of 100 and 59 mmHg, respectively.  Calculated AVA of 0.6 cm2. 2. Moderate aortic insufficiency 3. Mild to moderate mitral regurgitation 4. Preserved LVEF of 60-65% 5. Moderate left atrial enlargement  PLAN: 1.   Frank [name] has had recent TEE and TTE measurements which indicate severe aortic stenosis. Calculated aortic valve area is around 0.6 cm or less. There is at least moderate aortic insufficiency and mild to moderate mitral regurgitation. There is at least moderate left atrial enlargement. He has moderate LVH with preserved systolic function. He is reportedly asymptomatic, denying any chest pain, worsening shortness of breath, presyncope or heart failure symptoms. I do suspect there is significance of his valve at this point and it may need to be surgically replaced sooner than later. There is a class to a indication for replacement of the aortic valve in asymptomatic patients with a mean gradient over 60 mmHg, which is quite close to the  values that were measured during this study. I suspect he will be symptomatic with exercise and I'm recommending a treadmill exercise stress test. If this is abnormal then referral for aortic valve replacement is indicated by current AHA/ACC guidelines.  [name], MD, [facility] Attending Cardiologist [facility]  Phoebe Marter C 09/12/2014, 12:03 PM

## 2014-09-25 ENCOUNTER — Encounter: Payer: Self-pay | Admitting: Internal Medicine

## 2014-09-26 ENCOUNTER — Telehealth (HOSPITAL_COMMUNITY): Payer: Self-pay

## 2014-09-26 NOTE — Telephone Encounter (Signed)
Encounter complete. 

## 2014-09-28 ENCOUNTER — Encounter (HOSPITAL_COMMUNITY): Payer: 59

## 2014-09-28 ENCOUNTER — Other Ambulatory Visit: Payer: Self-pay | Admitting: *Deleted

## 2014-09-28 DIAGNOSIS — I350 Nonrheumatic aortic (valve) stenosis: Secondary | ICD-10-CM

## 2014-10-04 ENCOUNTER — Encounter: Payer: Self-pay | Admitting: Surgery

## 2014-10-04 ENCOUNTER — Institutional Professional Consult (permissible substitution) (INDEPENDENT_AMBULATORY_CARE_PROVIDER_SITE_OTHER): Payer: 59 | Admitting: Surgery

## 2014-10-04 VITALS — BP 137/85 | HR 92 | Resp 20 | Ht 69.5 in | Wt 174.0 lb

## 2014-10-04 DIAGNOSIS — I350 Nonrheumatic aortic (valve) stenosis: Secondary | ICD-10-CM

## 2014-10-05 ENCOUNTER — Other Ambulatory Visit: Payer: Self-pay | Admitting: *Deleted

## 2014-10-05 ENCOUNTER — Encounter: Payer: Self-pay | Admitting: Surgery

## 2014-10-05 DIAGNOSIS — I350 Nonrheumatic aortic (valve) stenosis: Secondary | ICD-10-CM

## 2014-10-05 NOTE — Progress Notes (Signed)
Cardiothoracic Surgery Consultation  PCP is [name], MD Referring Provider is [name]., MD  Chief Complaint  Patient presents with  . Aortic Stenosis    Surgical eval for possible AVR, ECHO 09/04/14    HPI:  The patient is a 49 year old gentleman with known bicuspid aortic valve with a history of at least moderate AS that had been followed by Dr. [name] at [place] [facility]. He underwent ACL repair by Dr. [name] on 08/01/2014 and was noted to have a loud AS murmur by anesthesia. Intraop TEE showed severe AS, moderate AI and mild to moderate MR. He got through surgery uneventfully and has been recovering nicely. He had a 2D echo on 09/04/2014 which shows severe AS with a mean gradient of 59 mm Hg and a peak of 100 mm Hg. There aortic valve is bicuspid and heavily calcified with restricted leaflet motion. AVA was 0.6 cm2. There was mild MR and mild aortic root dilatation. The LVEF was 55-60% with mild LVH. He says that he was not having any symptoms prior to his knee surgery but now notes exertional fatigue, chest pressure and shortness of breath. His wife notes that he sleeps a lot.   Past Medical History  Diagnosis Date  . Congenital heart valve abnormality     bicuspid aortic valve  . PONV (postoperative nausea and vomiting)     after a tonsillectomy..none since  . Hypertension   . Dysrhythmia     PVC's  . Hypercholesterolemia     Past Surgical History  Procedure Laterality Date  . Inguinal hernia repair  2006    left, Dr. Bary Castilla  . Tonsillectomy  2001  . Anterior cruciate ligament repair Right 08/01/2014    Procedure: RECONSTRUCTION ANTERIOR CRUCIATE LIGAMENT (ACL) WITH HAMSTRING GRAFT, MENISCAL DEBRIDEMENT AS NEEDED.;  Surgeon: Meredith Pel, MD;  Location: [place];  Service: Orthopedics;  Laterality: Right;  . Tee without cardioversion N/A 08/01/2014    Procedure: TRANSESOPHAGEAL ECHOCARDIOGRAM (TEE);  Surgeon: Meredith Pel, MD;  Location: [place];   Service: Orthopedics;  Laterality: N/A;    Family History  Problem Relation Age of Onset  . Arthritis Mother     psoriatis, Crohn's   . Heart disease Father     CAD in his early 60s    Social History History  Substance Use Topics  . Smoking status: Never Smoker   . Smokeless tobacco: Never Used  . Alcohol Use: 4.2 oz/week    7 Glasses of wine per week     Comment: occasional, drink 1 beer per wk    Current Outpatient Prescriptions  Medication Sig Dispense Refill  . ALPRAZolam (XANAX) 0.5 MG tablet Take 1 tablet (0.5 mg total) by mouth at bedtime as needed. 90 tablet 0  . Inositol Niacinate (NIACIN FLUSH FREE) 500 MG CAPS Take 1,000 mg by mouth daily. Take 2 by mouth daily    . omeprazole (PRILOSEC) 40 MG capsule Take 1 capsule by mouth  daily 90 capsule 1  . Red Yeast Rice 600 MG CAPS Take 1,200 mg by mouth daily. Take two by mouth twice a day.     No current facility-administered medications for this visit.    Allergies  Allergen Reactions  . Statins Other (See Comments)    Muscle ache    Review of Systems  Constitutional: Positive for activity change and fatigue. Negative for fever, chills, appetite change and unexpected weight change.  HENT: Negative.  Saw his dentist 06/2014  Eyes: Negative.   Respiratory: Positive for shortness of breath.        With exertion   Cardiovascular: Positive for chest pain. Negative for palpitations and leg swelling.       With exertion  Gastrointestinal:       Reflux  Endocrine: Negative.   Genitourinary: Negative.   Musculoskeletal:       Recovering from right knee ACL repair  Skin: Negative.   Allergic/Immunologic: Negative.   Neurological: Negative for dizziness and syncope.  Hematological: Negative.   Psychiatric/Behavioral: Negative.     BP 137/85 mmHg  Pulse 92  Resp 20  Ht 5' 9.5" (1.765 m)  Wt 174 lb (78.926 kg)  BMI 25.34 kg/m2  SpO2 98% Physical Exam  Constitutional: He is oriented to person, place,  and time. He appears well-developed and well-nourished. No distress.  HENT:  Head: Normocephalic and atraumatic.  Mouth/Throat: Oropharynx is clear and moist.  Eyes: EOM are normal. Pupils are equal, round, and reactive to light.  Neck: Normal range of motion. Neck supple. No JVD present. No thyromegaly present.  Cardiovascular: Normal rate, regular rhythm and intact distal pulses.   Murmur heard. 3/6 harsh crescendo/decrescendo murmur along RSB  Pulmonary/Chest: Effort normal and breath sounds normal. No respiratory distress. He has no wheezes. He has no rales.  Abdominal: Soft. Bowel sounds are normal. He exhibits no distension and no mass. There is no tenderness.  Musculoskeletal: Normal range of motion. He exhibits no edema.  Lymphadenopathy:    He has no cervical adenopathy.  Neurological: He is alert and oriented to person, place, and time. He has normal strength. No cranial nerve deficit or sensory deficit.  Skin: Skin is warm and dry.  Psychiatric: He has a normal mood and affect.     Diagnostic Tests:         *[facility] [place] [place]            [place], [place] [address]              [phone]  ------------------------------------------------------------------- Echocardiography  (Report amended )  Patient:  Frank [name], Frank [name] MR #:    [id number] Study Date: 09/04/2014 Gender:   M Age:    68 Height:   175.3 cm Weight:   76.7 kg BSA:    1.94 m^2 Pt. Status: Room:  ATTENDING  Kirk Ruths ORDERING   Cecilie Kicks R REFERRING  Isaiah Serge PERFORMING  [facility], Outpatient SONOGRAPHER [facility], RDCS  cc:  ------------------------------------------------------------------- LV EF: 55% -  60%  ------------------------------------------------------------------- Indications:   Aortic Valve Disease  (I35.0).  ------------------------------------------------------------------- History:  PMH: History of Bicuspid Aortic Valve, Palpitations  ------------------------------------------------------------------- Study Conclusions  - Left ventricle: The cavity size was normal. Wall thickness was increased in a pattern of mild LVH. Systolic function was normal. The estimated ejection fraction was in the range of 55% to 60%. - Aortic valve: The aortic valve is bicuspid and is heavily calcified with restricted leaflet motion. Peak and mean gradients through the valve are 100 and 59 mm Hg respectively, consistent with severe AS. The calculated AVA is 0.6 cm2. There is moderate, eccentric AI. There was mild regurgitation. - Aorta: Aortic root dimension: 39 mm (ED). - Aortic root: The aortic root is mildly dilated. - Mitral valve: Calcified annulus. Mildly thickened leaflets . There was mild regurgitation. - Left atrium: LA Volume/BSA= 46.4 ml/m2. The atrium is moderately  dilated.  Impressions:  - LVEF 60-65%, moderate concentric LVH, bicuspid and severely stenotic aortic valve- AVA 0.6 cm2, peak and mean gradients of 100 mmHg and 59 mmHg, respectively - LVOT diameter measured at 2.4 cm. There is moderate AI. There is mild to moderate MR with restricted anterior leaflet motion secondary to eccentric AI.  Echocardiography. M-mode, complete 2D, spectral Doppler, and color Doppler. Birthdate: Patient birthdate: 07/01/1966. Age: Patient is 49 yr old. Sex: Gender: male.  BMI: 25 kg/m^2. Blood pressure:   142/80 Patient status: Outpatient. Study date: Study date: 09/04/2014. Study time: 08:52 AM. Location: Echo laboratory.  -------------------------------------------------------------------  ------------------------------------------------------------------- Left ventricle: The cavity size was normal. Wall thickness was increased in a pattern of  mild LVH. Systolic function was normal. The estimated ejection fraction was in the range of 55% to 60%.  ------------------------------------------------------------------- Aortic valve: The aortic valve is bicuspid and is heavily calcified with restricted leaflet motion. Peak and mean gradients through the valve are 100 and 59 mm Hg respectively, consistent with severe AS. The calculated AVA is 0.6 cm2. There is moderate, eccentric AI. Doppler: There was mild regurgitation.  VTI ratio of LVOT to aortic valve: 0.13. Valve area (VTI): 0.59 cm^2. Indexed valve area (VTI): 0.3 cm^2/m^2. Peak velocity ratio of LVOT to aortic valve: 0.13. Valve area (Vmax): 0.59 cm^2. Indexed valve area (Vmax): 0.3 cm^2/m^2. Mean velocity ratio of LVOT to aortic valve: 0.11. Valve area (Vmean): 0.52 cm^2. Indexed valve area (Vmean): 0.27 cm^2/m^2.  Mean gradient (S): 59 mm Hg. Peak gradient (S): 100 mm Hg.  ------------------------------------------------------------------- Aorta: Aortic root: The aortic root is mildly dilated. Ascending aorta: The ascending aorta was normal in size.  ------------------------------------------------------------------- Mitral valve:  Calcified annulus. Mildly thickened leaflets . Doppler: There was mild regurgitation.  Peak gradient (D): 9 mm Hg.  ------------------------------------------------------------------- Left atrium: LA Volume/BSA= 46.4 ml/m2. The atrium is moderately dilated.  ------------------------------------------------------------------- Right ventricle: The cavity size was normal. Wall thickness was normal. Systolic function was normal.  ------------------------------------------------------------------- Pulmonic valve:  Structurally normal valve.  Cusp separation was normal. Doppler: Transvalvular velocity was within the normal range. There was mild  regurgitation.  ------------------------------------------------------------------- Tricuspid valve:  Structurally normal valve.  Leaflet separation was normal. Doppler: Transvalvular velocity was within the normal range. There was trivial regurgitation.  ------------------------------------------------------------------- Right atrium: The atrium was normal in size.  ------------------------------------------------------------------- Pericardium: There was no pericardial effusion.  ------------------------------------------------------------------- Systemic veins: Inferior vena cava: The vessel was normal in size. The respirophasic diameter changes were in the normal range (= 50%), consistent with normal central venous pressure. Diameter: 18.8 mm.  ------------------------------------------------------------------- Measurements  IVC                   Value      Reference ID                    18.8  mm    ---------  Left ventricle              Value      Reference LV ID, ED, PLAX chordal     (H)   55.3  mm    43 - 52 LV ID, ES, PLAX chordal     (H)   41   mm    23 - 38 LV fx shortening, PLAX chordal  (L)   26   %    >=29 LV PW thickness, ED           12.9  mm    ---------  IVS/LV PW ratio, ED           1        <=1.3 Stroke volume, 2D            61   ml    --------- Stroke volume/bsa, 2D          31   ml/m^2  --------- LV e&', lateral              9.1  cm/s   --------- LV E/e&', lateral             16.59      --------- LV e&', medial              8.33  cm/s   --------- LV E/e&', medial             18.13      --------- LV e&', average              8.72  cm/s   --------- LV E/e&', average             17.33       --------- LV ejection time             350  ms    ---------  Ventricular septum            Value      Reference IVS thickness, ED            12.9  mm    ---------  LVOT                   Value      Reference LVOT ID, S                24   mm    --------- LVOT area                4.52  cm^2   --------- LVOT peak velocity, S          65.91 cm/s   --------- LVOT mean velocity, S          40.5  cm/s   --------- LVOT VTI, S               15.7  cm    --------- Stroke volume (SV), LVOT DP       71   ml    --------- Stroke index (SV/bsa), LVOT DP      36.6  ml/m^2  ---------  Aortic valve               Value      Reference Aortic valve peak velocity, S      499.07 cm/s   --------- Aortic valve mean velocity, S      355.16 cm/s   --------- Aortic valve VTI, S           122.55 cm    --------- Aortic mean gradient, S         59   mm Hg  --------- Aortic peak gradient, S         100  mm Hg  --------- VTI ratio, LVOT/AV            0.13      --------- Aortic valve area, VTI          0.59  cm^2   --------- Aortic valve area/bsa, VTI        0.3  cm^2/m^2 --------- Velocity ratio, peak,  LVOT/AV      0.13      --------- Aortic valve area, peak velocity     0.59  cm^2   --------- Aortic valve area/bsa, peak       0.3  cm^2/m^2 --------- velocity Velocity ratio, mean, LVOT/AV      0.11      --------- Aortic valve area, mean velocity     0.52  cm^2   --------- Aortic valve area/bsa, mean       0.27  cm^2/m^2 --------- velocity  Aorta                  Value      Reference Aortic root ID, ED             34   mm    ---------  Left atrium               Value      Reference LA ID, A-P, ES              38   mm    --------- LA ID/bsa, A-P              1.96  cm/m^2  <=2.2 LA volume, S               89   ml    --------- LA volume/bsa, S             45.9  ml/m^2  --------- LA volume, ES, 1-p A4C          106  ml    --------- LA volume/bsa, ES, 1-p A4C        54.6  ml/m^2  --------- LA volume, ES, 1-p A2C          64   ml    --------- LA volume/bsa, ES, 1-p A2C        33   ml/m^2  ---------  Mitral valve               Value      Reference Mitral E-wave peak velocity       151  cm/s   --------- Mitral A-wave peak velocity       49.4  cm/s   --------- Mitral deceleration time     (L)   141  ms    150 - 230 Mitral peak gradient, D         9   mm Hg  --------- Mitral E/A ratio, peak          3.1       ---------  Right ventricle             Value      Reference RV s&', lateral, S            16   cm/s   ---------  Legend: (L) and (H) mark values outside specified reference range.  ------------------------------------------------------------------- Gaspar Skeeters MD [id number]:13:57   Impression:  I have personally reviewed his echocardiograms.  He has severe, symptomatic aortic stenosis with chest pressure and shortness of breath occuring with mild exertion. He feels that something has clearly changed since he knee surgery. His mean gradient is 59 mm Hg. I have recommended that he proceed with AVR as soon as possible. He had some dilation of the aortic root on echo and with a bicuspid valve I will get a CTA to accurately size the thoracic aorta. He will also need a cardiac  cath to evaluate his coronaries.  I discussed the pros and cons of mechanical and tissue valves with him and his wife. I think a mechanical valve would be best at his age because longevity will be an issue with a tissue valve in his age group. 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 He is going to do some more research and thinking about that. I would not do a Ross procedure in this patient with a bicuspid aortic valve and some aortic root dilatation due to the significant risk of failure. I spent a long time discussing the operative procedure, alternatives, benefits and risks with them. All of their questions have been answered.   Plan:  I will schedule a CTA of the chest to evaluate the aorta.  I will discuss cardiac cath with Dr. Debara Pickett.  I will plan to see him back afterward to discuss the results.   I spent 80 minutes performing this consultation and > 50% of this time was spent face to face counseling and coordinating the care of this patient's severe aortic stenosis.

## 2014-10-06 ENCOUNTER — Telehealth: Payer: Self-pay | Admitting: Internal Medicine

## 2014-10-06 ENCOUNTER — Other Ambulatory Visit (HOSPITAL_COMMUNITY): Payer: 59

## 2014-10-06 ENCOUNTER — Ambulatory Visit
Admission: RE | Admit: 2014-10-06 | Discharge: 2014-10-06 | Disposition: A | Discharge status: Home / Self Care | Payer: 59 | Source: Ambulatory Visit | Attending: Surgery | Admitting: Surgery

## 2014-10-06 DIAGNOSIS — I350 Nonrheumatic aortic (valve) stenosis: Secondary | ICD-10-CM

## 2014-10-06 MED ORDER — IOHEXOL 350 MG/ML SOLN
75.0000 mL | Freq: Once | INTRAVENOUS | Status: AC | PRN
  Administered 2014-10-06: 75 mL via INTRAVENOUS

## 2014-10-06 NOTE — Telephone Encounter (Signed)
Left voicemail for patient to call and reschedule appointment with Dr. Debara Pickett from 10-30-14 to sooner appointment.  Can overbook the appointment per Eliezer Lofts.

## 2014-10-10 ENCOUNTER — Encounter: Payer: Self-pay | Admitting: Internal Medicine

## 2014-10-18 ENCOUNTER — Encounter: Payer: Self-pay | Admitting: Internal Medicine

## 2014-10-18 ENCOUNTER — Ambulatory Visit: Payer: 59 | Admitting: Internal Medicine

## 2014-10-18 ENCOUNTER — Ambulatory Visit (INDEPENDENT_AMBULATORY_CARE_PROVIDER_SITE_OTHER): Payer: 59 | Admitting: Internal Medicine

## 2014-10-18 VITALS — BP 135/80 | HR 86 | Ht 70.0 in | Wt 178.8 lb

## 2014-10-18 DIAGNOSIS — I350 Nonrheumatic aortic (valve) stenosis: Secondary | ICD-10-CM

## 2014-10-18 DIAGNOSIS — R5383 Other fatigue: Secondary | ICD-10-CM

## 2014-10-18 DIAGNOSIS — Q231 Congenital insufficiency of aortic valve: Secondary | ICD-10-CM

## 2014-10-18 DIAGNOSIS — D689 Coagulation defect, unspecified: Secondary | ICD-10-CM

## 2014-10-18 DIAGNOSIS — Z79899 Other long term (current) drug therapy: Secondary | ICD-10-CM

## 2014-10-18 NOTE — Progress Notes (Signed)
OFFICE NOTE  Chief Complaint:  Preprocedure visit  Primary Care Physician: [name], MD  HPI:  Frank [name] is a pleasant 49 yo male with a history of at least moderate AS (followed by Dr. [name] at the [facility]), presented for ACL repair with Dr. [name]. Preoperatively, he was evaluated by Dr. [name] (cardiac anesthesia) and found to have a loud AS murmur- After some discussion, interoperative TEE was recommended and performed. I personally reviewed the TEE images which do demonstrate severe aortic stenosis, moderate AI and mild to moderate MR (obtained during surgery, on pressor, PPV, etc, but still significant). He was recently told he probably had another 5 years before needing to consider surgery. Mean gradient is already above 40 mmHg. Clinically, he has been asymptomatic (no chest pain, dyspnea, syncope, etc,) but recently he has had more palpitations (PVC's) and was started on b-blocker. He did manage to get through surgery uneventfully.   He returns today in follow-up to the office. He continues to deny any chest pain, worsening shortness of breath, presyncope or syncopal episodes. He has not pushed himself significantly due to his recent knee surgery. He is undergoing rehabilitation and getting stronger. A repeat echocardiogram was just performed as an outpatient. This was initially read by one of my partners and indicated mild to moderate aortic stenosis based on a mean valve gradient in the 20s. However subsequent images during the study do indicate mean gradients greater than 50 from the right upper sternal border using Pedoff probe. I therefore personally reviewed the echocardiogram images and made the appropriate corrections,  and the results are as follows:  Study Conclusions  - Left ventricle: The cavity size was normal. Wall thickness was increased in a pattern of mild LVH. Systolic function was normal. The estimated ejection fraction was in the range  of 55% to 60%. - Aortic valve: The aortic valve is bicuspid and is heavily calcified with restricted leaflet motion. Peak and mean gradients through the valve are 100 and 59 mm Hg respectively, consistent with severe AS. The calculated AVA is 0.6 cm2. There is moderate, eccentric AI. There was mild regurgitation. - Aorta: Aortic root dimension: 39 mm (ED). - Aortic root: The aortic root is mildly dilated. - Mitral valve: Calcified annulus. Mildly thickened leaflets . There was mild regurgitation. - Left atrium: LA Volume/BSA= 46.4 ml/m2. The atrium is moderately dilated.  Impressions:  - LVEF 60-65%, moderate concentric LVH, bicuspid and severely stenotic aortic valve- AVA 0.6 cm2, peak and mean gradients of 100 mmHg and 59 mmHg, respectively - LVOT diameter measured at 2.4 cm. There is moderate AI. There is mild to moderate MR with restricted anterior leaflet motion secondary to eccentric AI.  Pedoff measurements do indeed show a very high mean gradient of 59 and peak gradient of 100 mmHg. Based on a generous LVOT diameter of 2.4 cm, the calculated aortic valve gradient was 0.6 cm consistent with severe aortic stenosis.  I saw Frank [name] back in the office today. He was recently on a trip and noticed increasing shortness of breath and easy fatigue with exercise which was a new finding for him. He feels that this is symptomatic aortic stenosis and I would agree. I discussed the case further with Dr. Cyndia Bent, the cardiac thoracic surgeon that he's been seeing, and we feel that it's probably the time now to entertain valve replacement. Based on that there is also concern about aortopathy given his bicuspid valve. He underwent a CT scan  of the aorta and coronaries which showed does reveal multivessel coronary artery calcification as well as calcium deposits in the left main coronary. The aortic valve is heavily calcified and restricted in motion. There was at least mild  cardiac hypertrophy and dilatation of the left ventricle. The ascending aorta is dilated measuring 4.3-4.4 cm. There is no evidence for coarctation or enlargement of the thoracic or descending aorta.  PMHx:  Past Medical History  Diagnosis Date  . Congenital heart valve abnormality     bicuspid aortic valve  . PONV (postoperative nausea and vomiting)     after a tonsillectomy..none since  . Hypertension   . Dysrhythmia     PVC's  . Hypercholesterolemia     Past Surgical History  Procedure Laterality Date  . Inguinal hernia repair  2006    left, Dr. Bary Castilla  . Tonsillectomy  2001  . Anterior cruciate ligament repair Right 08/01/2014    Procedure: RECONSTRUCTION ANTERIOR CRUCIATE LIGAMENT (ACL) WITH HAMSTRING GRAFT, MENISCAL DEBRIDEMENT AS NEEDED.;  Surgeon: Meredith Pel, MD;  Location: [place];  Service: Orthopedics;  Laterality: Right;  . Tee without cardioversion N/A 08/01/2014    Procedure: TRANSESOPHAGEAL ECHOCARDIOGRAM (TEE);  Surgeon: Meredith Pel, MD;  Location: [place];  Service: Orthopedics;  Laterality: N/A;    FAMHx:  Family History  Problem Relation Age of Onset  . Arthritis Mother     psoriatis, Crohn's   . Heart disease Father     CAD in his early 58s    SOCHx:   reports that he has never smoked. He has never used smokeless tobacco. He reports that he drinks about 4.2 oz of alcohol per week. He reports that he does not use illicit drugs.  ALLERGIES:  Allergies  Allergen Reactions  . Statins Other (See Comments)    Muscle ache    ROS: A comprehensive review of systems was negative except for: Musculoskeletal: positive for stiff joints and knee pain  HOME MEDS: Current Outpatient Prescriptions  Medication Sig Dispense Refill  . ALPRAZolam (XANAX) 0.5 MG tablet Take 1 tablet (0.5 mg total) by mouth at bedtime as needed. 90 tablet 0  . Inositol Niacinate (NIACIN FLUSH FREE) 500 MG CAPS Take 1,000 mg by mouth daily. Take 2 by mouth daily    .  omeprazole (PRILOSEC) 40 MG capsule Take 1 capsule by mouth  daily 90 capsule 1  . Red Yeast Rice 600 MG CAPS Take 1,200 mg by mouth daily. Take two by mouth twice a day.     No current facility-administered medications for this visit.    LABS/IMAGING: No results found for this or any previous visit (from the past 48 hour(s)). No results found.  VITALS: BP 135/80 mmHg  Pulse 86  Ht 5\' 10"  (1.778 m)  Wt 178 lb 12.8 oz (81.103 kg)  BMI 25.66 kg/m2  EXAM: Deferred  EKG: deferred  ASSESSMENT: 1. Severe calcific aortic stenosis of a bicuspid valve-peak and mean gradients of 100 and 59 mmHg, respectively.  Calculated AVA of 0.6 cm2. 2. Moderate aortic insufficiency 3. Mild to moderate mitral regurgitation 4. Preserved LVEF of 60-65% 5. Moderate left atrial enlargement 6. Multivessel and left main coronary calcium 7. Thoracic aortic aneurysm measuring 4.4 cm  PLAN: 1.   Frank [name] is now having symptomatic severe aortic stenosis. He has multivessel and left main coronary calcium deposits on CT scan as well as a dilated ascending aortic aneurysm measuring up to 4.4 cm. Given that he's become increasingly more  symptomatic, I think it is time for him to undergo operation. He does have multivessel coronary calcium and left main coronary artery calcium concerning for possible multivessel coronary artery disease. Prior to surgery he needs to undergo left heart catheterization to define his coronary anatomy. We discussed risks and benefits of this procedure today and he provided informed consent for the procedure. Plan will be through a right radial approach. Hopefully we can get this done next week. Ultimately he may need root replacement or perhaps banding of the ascending aorta as per Dr. Cyndia Bent.  [name], MD, [facility] Attending Cardiologist [facility]  Frank [name],Frank [name] 10/18/2014, 5:07 PM

## 2014-10-18 NOTE — Patient Instructions (Addendum)
Your physician has requested that you have a cardiac catheterization with Dr. Debara Pickett (Thursday March 10th ~11am). Cardiac catheterization is used to diagnose and/or treat various heart conditions. Doctors may recommend this procedure for a number of different reasons. The most common reason is to evaluate chest pain. Chest pain can be a symptom of coronary artery disease (CAD), and cardiac catheterization can show whether plaque is narrowing or blocking your heart's arteries. This procedure is also used to evaluate the valves, as well as measure the blood flow and oxygen levels in different parts of your heart. For further information please visit [email or web address]. Please follow instruction sheet, as given.  You will need to have blood work 3-5 days prior to this procedure.

## 2014-10-20 ENCOUNTER — Other Ambulatory Visit: Payer: Self-pay | Admitting: *Deleted

## 2014-10-20 DIAGNOSIS — IMO0002 Reserved for concepts with insufficient information to code with codable children: Secondary | ICD-10-CM

## 2014-10-21 LAB — PROTIME-INR
INR: 1 (ref 0.8–1.2)
Prothrombin Time: 10.7 s (ref 9.1–12.0)

## 2014-10-21 LAB — BASIC METABOLIC PANEL
BUN/Creatinine Ratio: 11 (ref 9–20)
BUN: 15 mg/dL (ref 6–24)
CALCIUM: 10 mg/dL (ref 8.7–10.2)
CO2: 20 mmol/L (ref 18–29)
Chloride: 97 mmol/L (ref 97–108)
Creatinine, Ser: 1.31 mg/dL — ABNORMAL HIGH (ref 0.76–1.27)
GFR calc Af Amer: 73 mL/min/{1.73_m2} (ref >59)
GFR, EST NON AFRICAN AMERICAN: 63 mL/min/{1.73_m2} (ref >59)
GLUCOSE: 97 mg/dL (ref 65–99)
POTASSIUM: 4.7 mmol/L (ref 3.5–5.2)
Sodium: 136 mmol/L (ref 134–144)

## 2014-10-21 LAB — CBC
HEMATOCRIT: 42 % (ref 37.5–51.0)
HEMOGLOBIN: 14.8 g/dL (ref 12.6–17.7)
MCH: 29.4 pg (ref 26.6–33.0)
MCHC: 35.2 g/dL (ref 31.5–35.7)
MCV: 83 fL (ref 79–97)
Platelets: 229 10*3/uL (ref 150–379)
RBC: 5.04 x10E6/uL (ref 4.14–5.80)
RDW: 14.4 % (ref 12.3–15.4)
WBC: 6.9 10*3/uL (ref 3.4–10.8)

## 2014-10-21 LAB — TSH: TSH: 2.26 u[IU]/mL (ref 0.450–4.500)

## 2014-10-21 LAB — APTT: aPTT: 31 s (ref 24–33)

## 2014-10-26 ENCOUNTER — Other Ambulatory Visit: Payer: Self-pay | Admitting: Internal Medicine

## 2014-10-26 ENCOUNTER — Other Ambulatory Visit: Payer: Self-pay | Admitting: *Deleted

## 2014-10-26 ENCOUNTER — Encounter (HOSPITAL_COMMUNITY): Payer: Self-pay | Admitting: *Deleted

## 2014-10-26 ENCOUNTER — Ambulatory Visit (HOSPITAL_COMMUNITY)
Admission: RE | Admit: 2014-10-26 | Discharge: 2014-10-26 | Disposition: A | Discharge status: Home / Self Care | Payer: 59 | Source: Ambulatory Visit | Attending: Internal Medicine | Admitting: Internal Medicine

## 2014-10-26 ENCOUNTER — Encounter (HOSPITAL_COMMUNITY)
Admission: RE | Disposition: A | Discharge status: Home / Self Care | Payer: Self-pay | Source: Ambulatory Visit | Attending: Internal Medicine

## 2014-10-26 DIAGNOSIS — I340 Nonrheumatic mitral (valve) insufficiency: Secondary | ICD-10-CM | POA: Diagnosis not present

## 2014-10-26 DIAGNOSIS — I350 Nonrheumatic aortic (valve) stenosis: Secondary | ICD-10-CM | POA: Diagnosis present

## 2014-10-26 DIAGNOSIS — I517 Cardiomegaly: Secondary | ICD-10-CM | POA: Diagnosis not present

## 2014-10-26 DIAGNOSIS — I77819 Aortic ectasia, unspecified site: Secondary | ICD-10-CM | POA: Insufficient documentation

## 2014-10-26 DIAGNOSIS — IMO0002 Reserved for concepts with insufficient information to code with codable children: Secondary | ICD-10-CM

## 2014-10-26 DIAGNOSIS — I700 Atherosclerosis of aorta: Secondary | ICD-10-CM | POA: Diagnosis not present

## 2014-10-26 DIAGNOSIS — I351 Nonrheumatic aortic (valve) insufficiency: Secondary | ICD-10-CM | POA: Diagnosis not present

## 2014-10-26 DIAGNOSIS — Q231 Congenital insufficiency of aortic valve: Secondary | ICD-10-CM

## 2014-10-26 HISTORY — PX: CORONARY ANGIOGRAM: SHX5466

## 2014-10-26 SURGERY — CORONARY ANGIOGRAM

## 2014-10-26 MED ORDER — SODIUM CHLORIDE 0.9 % IJ SOLN: 3.0000 mL | INTRAMUSCULAR | Status: DC | PRN

## 2014-10-26 MED ORDER — HEPARIN SODIUM (PORCINE) 1000 UNIT/ML IJ SOLN
INTRAMUSCULAR | Status: AC
  Filled 2014-10-26: qty 1

## 2014-10-26 MED ORDER — ATROPINE SULFATE 0.1 MG/ML IJ SOLN
INTRAMUSCULAR | Status: AC
  Filled 2014-10-26: qty 10

## 2014-10-26 MED ORDER — NITROGLYCERIN 1 MG/10 ML FOR IR/CATH LAB
INTRA_ARTERIAL | Status: AC
  Filled 2014-10-26: qty 10

## 2014-10-26 MED ORDER — ASPIRIN 81 MG PO CHEW
81.0000 mg | CHEWABLE_TABLET | ORAL | Status: AC
  Administered 2014-10-26: 81 mg via ORAL

## 2014-10-26 MED ORDER — ALPRAZOLAM 0.5 MG PO TABS: 0.5000 mg | ORAL_TABLET | Freq: Every evening | ORAL | Status: DC | PRN

## 2014-10-26 MED ORDER — HEPARIN (PORCINE) IN NACL 2-0.9 UNIT/ML-% IJ SOLN
INTRAMUSCULAR | Status: AC
  Filled 2014-10-26: qty 1500

## 2014-10-26 MED ORDER — VERAPAMIL HCL 2.5 MG/ML IV SOLN
INTRAVENOUS | Status: AC
Start: 2014-10-26 — End: 2014-10-26
  Filled 2014-10-26: qty 2

## 2014-10-26 MED ORDER — SODIUM CHLORIDE 0.9 % IV SOLN
INTRAVENOUS | Status: DC
  Administered 2014-10-26: 14:00:00 via INTRAVENOUS

## 2014-10-26 MED ORDER — SODIUM CHLORIDE 0.9 % IV SOLN: 1.0000 mL/kg/h | INTRAVENOUS | Status: AC

## 2014-10-26 MED ORDER — ONDANSETRON HCL 4 MG/2ML IJ SOLN: 4.0000 mg | Freq: Four times a day (QID) | INTRAMUSCULAR | Status: DC | PRN

## 2014-10-26 MED ORDER — MIDAZOLAM HCL 2 MG/2ML IJ SOLN
INTRAMUSCULAR | Status: AC
  Filled 2014-10-26: qty 2

## 2014-10-26 MED ORDER — FENTANYL CITRATE 0.05 MG/ML IJ SOLN
INTRAMUSCULAR | Status: AC
  Filled 2014-10-26: qty 2

## 2014-10-26 MED ORDER — ACETAMINOPHEN 325 MG PO TABS: 650.0000 mg | ORAL_TABLET | ORAL | Status: DC | PRN

## 2014-10-26 MED ORDER — LIDOCAINE HCL (PF) 1 % IJ SOLN
INTRAMUSCULAR | Status: AC
Start: 2014-10-26 — End: 2014-10-26
  Filled 2014-10-26: qty 30

## 2014-10-26 MED ORDER — ASPIRIN 81 MG PO CHEW
CHEWABLE_TABLET | ORAL | Status: AC
  Filled 2014-10-26: qty 1

## 2014-10-26 NOTE — Discharge Instructions (Signed)
Transradial Angiography Angiography is a procedure used to look at the blood vessels. Blood vessels are tubes that carry blood to different parts of the body. During angiography, dye is injected through a long, thin tube (catheter) into an artery. An artery is a type of blood vessel that carries blood from the heart to an organ or other body part. Transradial angiography refers to using the radial artery, located in the wrist, as the access site or where the catheter is inserted. The tip of the catheter is manipulated from there to the blood vessels the caregiver needs to see, such as vessels that feed the heart, brain, legs, or other organs.After the dye is injected, X-rays are taken. The X-rays show if there is problem such as a blockage in a blood vessel.  The procedure is usually performed in a hospital and takes about 30 minutes to complete, though it may take longer. LET YOUR CAREGIVER KNOW ABOUT:  Allergies, including allergies to certain dyes.   Medicines taken, including vitamins, herbs, eyedrops, over-the-counter medicines, and creams. Also, let your caregiver know if you are taking aspirin or other medicines that may affect blood clotting.   Use of steroids (by mouth or creams).   Previous problems with anesthetics or other numbing medicines.   History of bleeding problems or blood clots.   Smoking history.   Previous surgery.   Other health problems, including diabetes and kidney problems.   Any recent infections or fevers.  Possibility of pregnancy.  RISKS AND COMPLICATIONS As with any procedure, complications may occur, but they can usually be managed by your caregiver. Complications may include:   Reaction to anesthetics.   Damage to surrounding nerves, tissues, or structures.   Infection.   Blood clots.   Scarring.  The following complications may also occur, but are rare:   Losing too much blood.   Blood flow through the radial artery slows  down or stops.   Your caregiver cannot find or use your radial artery.   The procedure does not work. BEFORE THE PROCEDURE   You may need to have a physical exam. You may also need to take some tests, such as blood, blood pressure, or imaging tests.   Ask your caregiver about changing or stopping your regular medicines.  Avoid wearing heavy makeup, jewelry, and hair accessories the day of the procedure.   Stop smoking at least 24 hours before the procedure.  Make plans to have someone drive you home after the procedure.  Do not eat or drink 7-8 hours before the procedure. Ask if it is okay to take any needed medicine with a small sip of water. PROCEDURE   You will be given a medicine called a local anesthetic to make the wrist area numb. You may also be given a medicine to help you relax (sedative) through an intravenous (IV) access tube in your hand or arm.  The area on your wrist where a flexible tube (catheter) will be inserted will be washed and shaved.  A tiny needle will be inserted through the skin into the radial artery and a guide wire will be inserted. The catheter will then be inserted over the wire and advanced to the desired location using a type of X-ray procedure called fluoroscopy.  Dye will then be injected and X-rays will be taken. The X-rays will show where any narrowing or blockages are located in the blood vessels.  At the end of the procedure, a compression device will be applied to the  wrist to prevent bleeding. AFTER THE PROCEDURE  You will need to keep your wrist still until your caregiver says it is okay to move. This will usually be within a few hours of the procedure ending. While you are recovering, the caregiver may:   Monitor you frequently.   Gradually decrease the pressure on the compression device.   Take blood tests, other X-rays, and electrocardiography. You may be able to go home the same day or may need to stay in the hospital  overnight for observation. Your caregiver will decide if that is needed. You may feel a little soreness and notice bruising at the incision site. You may also feel mild tingling of the hand. This is normal and will go away in a few days.  Document Released: 04/28/2012 Document Reviewed: 04/28/2012 [facility] Patient Information 2015 [place]. This information is not intended to replace advice given to you by your health care provider. Make sure you discuss any questions you have with your health care provider.   Radial Site Care Refer to this sheet in the next few weeks. These instructions provide you with information on caring for yourself after your procedure. Your caregiver may also give you more specific instructions. Your treatment has been planned according to current medical practices, but problems sometimes occur. Call your caregiver if you have any problems or questions after your procedure. HOME CARE INSTRUCTIONS You may shower the day after the procedure.Remove the bandage (dressing) and gently wash the site with plain soap and water.Gently pat the site dry. Do not apply powder or lotion to the site. Do not submerge the affected site in water for 3 to 5 days. Inspect the site at least twice daily. Do not flex or bend the affected arm for 24 hours. No lifting over 5 pounds (2.3 kg) for 5 days after your procedure. Do not drive home if you are discharged the same day of the procedure. Have someone else drive you. You may drive 24 hours after the procedure unless otherwise instructed by your caregiver. Do not operate machinery or power tools for 24 hours. A responsible adult should be with you for the first 24 hours after you arrive home. What to expect: Any bruising will usually fade within 1 to 2 weeks. Blood that collects in the tissue (hematoma) may be painful to the touch. It should usually decrease in size and tenderness within 1 to 2 weeks. SEEK IMMEDIATE MEDICAL CARE  IF: You have unusual pain at the radial site. You have redness, warmth, swelling, or pain at the radial site. You have drainage (other than a small amount of blood on the dressing). You have chills. You have a fever or persistent symptoms for more than 72 hours. You have a fever and your symptoms suddenly get worse. Your arm becomes pale, cool, tingly, or numb. You have heavy bleeding from the site. Hold pressure on the site. Document Released: 09/06/2010 Document Revised: 10/27/2011 Document Reviewed: 09/06/2010 [facility] Patient Information 2015 [place], [place]. This information is not intended to replace advice given to you by your health care provider. Make sure you discuss any questions you have with your health care provider.

## 2014-10-26 NOTE — Telephone Encounter (Signed)
Ok to refill,  printed rx  

## 2014-10-26 NOTE — Telephone Encounter (Signed)
Last visit 05/01/14, ok refill?

## 2014-10-26 NOTE — CV Procedure (Signed)
CARDIAC CATHETERIZATION REPORT  CHING RABIDEAU   [id number] 21-Jun-1966  Performing Cardiologist: [name] Primary Physician: [name], MD Primary Cardiologist:  Hilty  Procedures Performed:  Left Heart Catheterization via 5 Fr right radial artery access  Native Coronary Angiography  Indication(s): pre-op for severe aortic stenosis  Pre-Procedural Diagnosis(es):  1. Bicuspid aortic valve 2. Severe aortic stenosis  Post-Procedural Diagnosis(es): 1. Same as above 2. No obstructive coronary disease  Pre-Procedural Non-invasive testing: none  History: 49 y.o. male presented with is a pleasant 49 yo male with a history of at least moderate AS (followed by Dr. [name] at the [facility]), presented for ACL repair with Dr. [name]. Preoperatively, he was evaluated by Dr. [name] (cardiac anesthesia) and found to have a loud AS murmur- After some discussion, interoperative TEE was recommended and performed. I personally reviewed the TEE images which do demonstrate severe aortic stenosis, moderate AI and mild to moderate MR (obtained during surgery, on pressor, PPV, etc, but still significant). He was recently told he probably had another 5 years before needing to consider surgery. Mean gradient is already above 40 mmHg. Clinically, he has been asymptomatic (no chest pain, dyspnea, syncope, etc,) but recently he has had more palpitations (PVC's) and was started on b-blocker. He did manage to get through surgery uneventfully.   He returns today in follow-up to the office. He continues to deny any chest pain, worsening shortness of breath, presyncope or syncopal episodes. He has not pushed himself significantly due to his recent knee surgery. He is undergoing rehabilitation and getting stronger. A repeat echocardiogram was just performed as an outpatient. This was initially read by one of my partners and indicated mild to moderate aortic stenosis based on a mean valve gradient  in the 20s. However subsequent images during the study do indicate mean gradients greater than 50 from the right upper sternal border using Pedoff probe. I therefore personally reviewed the echocardiogram images and made the appropriate corrections, and the results are as follows:  Study Conclusions  - Left ventricle: The cavity size was normal. Wall thickness was increased in a pattern of mild LVH. Systolic function was normal. The estimated ejection fraction was in the range of 55% to 60%. - Aortic valve: The aortic valve is bicuspid and is heavily calcified with restricted leaflet motion. Peak and mean gradients through the valve are 100 and 59 mm Hg respectively, consistent with severe AS. The calculated AVA is 0.6 cm2. There is moderate, eccentric AI. There was mild regurgitation. - Aorta: Aortic root dimension: 39 mm (ED). - Aortic root: The aortic root is mildly dilated. - Mitral valve: Calcified annulus. Mildly thickened leaflets . There was mild regurgitation. - Left atrium: LA Volume/BSA= 46.4 ml/m2. The atrium is moderately dilated.  Impressions:  - LVEF 60-65%, moderate concentric LVH, bicuspid and severely stenotic aortic valve- AVA 0.6 cm2, peak and mean gradients of 100 mmHg and 59 mmHg, respectively - LVOT diameter measured at 2.4 cm. There is moderate AI. There is mild to moderate MR with restricted anterior leaflet motion secondary to eccentric AI.  Pedoff measurements do indeed show a very high mean gradient of 59 and peak gradient of 100 mmHg. Based on a generous LVOT diameter of 2.4 cm, the calculated aortic valve gradient was 0.6 cm consistent with severe aortic stenosis.  Risks / Complications include, but not limited to: Death, MI, CVA/TIA, VF/VT (with defibrillation), Bradycardia (need for temporary pacer placement), contrast induced nephropathy, bleeding / bruising / hematoma /  pseudoaneurysm, vascular or coronary injury (with possible  emergent CT or Vascular Surgery), adverse medication reactions, infection.    Consent: Risks of procedure as well as the alternatives and risks of each were explained to the (patient/caregiver).  Consent for procedure obtained.  Procedure: The patient was brought to the [facility] in the fasting state and prepped and draped in the usual sterile fashion for (Right radial) access. A modified Allen's test with plethysmography was performed on the right wrist demonstrating adequate Ulnar Artery collateral flow.    Time Out: Verified patient identification, verified procedure, site/side was marked, verified correct patient position, special equipment/implants available, radiation safety measures in place (including badges and shielding), medications/allergies/relevent history reviewed, required imaging and test results available.  Performed  Procedure: The right wrist was anesthetized with 1% subcutaneous Lidocaine.  The right radial artery was accessed using the Seldinger Technique with placement of a 6 Fr Glide Sheath - this required 50 mcg of subcutaneous nitroglycerin due to radial spasm. The sheath was aspirated and flushed.  Then a total of 10 ml of standard Radial Artery Cocktail (see medications) was infused.  A 5 Fr TIG 4.0 Catheter was advanced of over a Safety J wire into the ascending Aorta.  The catheter was not able to engage the coronary arteries.  Multiple catheter exchanges were made and eventually a 28F JL3.5 catheter was used to engage the left coronary artery system. Multiple cineangiographic views of the left coronary system were made by hand injection. This catheter was then exchanged for a 28F JR4 and eventually a 28F 3DRC catheter, which successfully engaged a small, non-dominant right coronary.  Multiple cineangiographic views of the right coronary artery system(s) was performed. This catheter was then exchanged over the Long Exchange Safety J wire for  an angled Pigtail catheter that was advanced across the Aortic Valve.  LV hemodynamics were measured and the catheter was pulled back across the Aortic Valve for measurement of "pull-back" gradient.  The catheter and the wire were removed completely out of the body.  The sheath was removed in the Cath Lab with a TR band placed at 15 ml Air at 1600 (time).  Reverse Allen's test did not reveal non-occlusive hemostasis.  Recovery: The patient was transported to the cath lab holding area in stable condition.   The patient  was noted to have a vagal reaction during the procedure. At which point, the catheterization was held and he was given a 250 cc saline bolus and 0.5 mg atropine. He was noted to be hypotensive and bradycardic, but that improved back to baseline by the end of the case. There were not complications.  EBL: Minimal  Medications:  Premedication: none  Sedation:  2 mg IV Versed, 25 mcg IV Fentanyl  Contrast:  50 ml Omnipaque  Local Anesthesia: 3 cc 1% lidocaine  4000 U IV Heparin  15 cc radial cocktail  0.5 mg IV atropine  50 mcg Subcutaneous nitroglycerin  Hemodynamics:  Central Aortic Pressure / Mean Aortic Pressure: 140/76  LV Pressure / LV End diastolic Pressure:  N/A  Coronary Angiographic Data:  Left Main:  Normal, bifurcates into the LAD and LCX systems.  Left Anterior Descending (LAD):  Mild luminal irregularities  1st diagonal (D1):  No stenosis.  Circumflex (LCx):  Dominant. Bifurcates in a pitchfork fashion on the lateral wall  1st obtuse marginal:  No stenosis.   posterior lateral branch:  No stenosis.  Right Coronary Artery: Non-dominant vessel, small  posterior descending artery: no stenosis  posterior lateral branch:  No stenosis  Impression: 1.  No significant angiographic stenosis. 2.  Heavily calcified aortic valve. 3.  Dilated aortic root.  Plan: 1.  Plan for aortic valve replacement per Dr. Cyndia Bent.  The case and results was discussed  with the patient and family if available.  The case and results was not discussed with the patient's PCP. The case and results was discussed with the patient's Cardiologist.  Time Spent Directly with the Patient:  60 minutes  [name], MD, [facility] Attending Cardiologist [facility]  HILTY,Kenneth C 10/26/2014, 4:16 PM

## 2014-10-26 NOTE — H&P (Signed)
     INTERVAL PROCEDURE H&P  History and Physical Interval Note:  10/26/2014 2:48 PM  Frank [name] has presented today for their planned procedure. The various methods of treatment have been discussed with the patient and family. After consideration of risks, benefits and other options for treatment, the patient has consented to the procedure.  The patients' outpatient history has been reviewed, patient examined, and no change in status from most recent office note within the past 30 days. I have reviewed the patients' chart and labs and will proceed as planned. Questions were answered to the patient's satisfaction.   Cath Lab Visit (complete for each Cath Lab visit)  Clinical Evaluation Leading to the Procedure:   ACS: No.  Non-ACS:    Anginal Classification: CCS II  Anti-ischemic medical therapy: Minimal Therapy (1 class of medications)  Non-Invasive Test Results: No non-invasive testing performed  Prior CABG: No previous CABG  Frank Casino, MD, [facility] Attending Cardiologist [facility]  Frank [name] C 10/26/2014, 2:48 PM

## 2014-10-27 NOTE — Telephone Encounter (Signed)
Faxed to pharmacy

## 2014-10-30 ENCOUNTER — Ambulatory Visit: Payer: 59 | Admitting: Internal Medicine

## 2014-11-01 ENCOUNTER — Other Ambulatory Visit: Payer: Self-pay | Admitting: *Deleted

## 2014-11-01 ENCOUNTER — Ambulatory Visit (INDEPENDENT_AMBULATORY_CARE_PROVIDER_SITE_OTHER): Payer: 59 | Admitting: Surgery

## 2014-11-01 ENCOUNTER — Encounter: Payer: Self-pay | Admitting: Surgery

## 2014-11-01 VITALS — BP 132/78 | HR 86 | Resp 20 | Ht 70.0 in | Wt 175.0 lb

## 2014-11-01 DIAGNOSIS — I712 Thoracic aortic aneurysm, without rupture: Secondary | ICD-10-CM

## 2014-11-01 DIAGNOSIS — I350 Nonrheumatic aortic (valve) stenosis: Secondary | ICD-10-CM

## 2014-11-01 DIAGNOSIS — I7121 Aneurysm of the ascending aorta, without rupture: Secondary | ICD-10-CM

## 2014-11-01 NOTE — Progress Notes (Signed)
HPI:  The patient returns today to review the results of his recent cardiac cath and CTA of the chest. I have personally reviewed CTA films and cardiac cath films. There is no coronary stenosis. The CTA shows that there is dilatation of the ascending aorta to 4.4 cm at the level of the right pulmonary artery. The descending aorta at the same level is 2.5 cm.   Current Outpatient Prescriptions  Medication Sig Dispense Refill  . ALPRAZolam (XANAX) 0.5 MG tablet Take 1 tablet (0.5 mg total) by mouth at bedtime as needed. 90 tablet 0  . Inositol Niacinate (NIACIN FLUSH FREE) 500 MG CAPS Take 1,000 mg by mouth daily.     . Multiple Vitamin (MULTIVITAMIN WITH MINERALS) TABS tablet Take 1 tablet by mouth daily.    Marland Kitchen omeprazole (PRILOSEC) 40 MG capsule Take 1 capsule by mouth  daily 90 capsule 1  . Red Yeast Rice Extract (RED YEAST RICE PO) Take 500 mg by mouth 2 (two) times daily. Red yeast 250mg      No current facility-administered medications for this visit.     Physical Exam: BP 132/78 mmHg  Pulse 86  Resp 20  Ht 5\' 10"  (1.778 m)  Wt 175 lb (79.379 kg)  BMI 25.11 kg/m2  SpO2 98% He looks well Cardiac exam shows a 3/6 harsh systolic murmur along the RSB. Lungs are clear  Diagnostic Tests:   CARDIAC CATHETERIZATION REPORT  Frank [name] [id number] [birth year]  Performing Cardiologist: Frank [name] Primary Physician: Frank Mc, MD Primary Cardiologist: Frank [name]  Procedures Performed:  Left Heart Catheterization via 5 Fr right radial artery access  Native Coronary Angiography  Indication(s): pre-op for severe aortic stenosis  Pre-Procedural Diagnosis(es):  1. Bicuspid aortic valve 2. Severe aortic stenosis  Post-Procedural Diagnosis(es): 1. Same as above 2. No obstructive coronary disease  Pre-Procedural Non-invasive testing: none  History: 49 y.o. male presented with is a pleasant 49 yo male with a history of at least  moderate AS (followed by Frank [name] at the [facility]), presented for ACL repair with Frank [name]. Preoperatively, he was evaluated by Frank [name] (cardiac anesthesia) and found to have a loud AS murmur- After some discussion, interoperative TEE was recommended and performed. I personally reviewed the TEE images which do demonstrate severe aortic stenosis, moderate AI and mild to moderate MR (obtained during surgery, on pressor, PPV, etc, but still significant). He was recently told he probably had another 5 years before needing to consider surgery. Mean gradient is already above 40 mmHg. Clinically, he has been asymptomatic (no chest pain, dyspnea, syncope, etc,) but recently he has had more palpitations (PVC's) and was started on b-blocker. He did manage to get through surgery uneventfully.   He returns today in follow-up to the office. He continues to deny any chest pain, worsening shortness of breath, presyncope or syncopal episodes. He has not pushed himself significantly due to his recent knee surgery. He is undergoing rehabilitation and getting stronger. A repeat echocardiogram was just performed as an outpatient. This was initially read by one of my partners and indicated mild to moderate aortic stenosis based on a mean valve gradient in the 20s. However subsequent images during the study do indicate mean gradients greater than 50 from the right upper sternal border using Pedoff probe. I therefore personally reviewed the echocardiogram images and made the appropriate corrections, and the results are as follows:  Study Conclusions  - Left ventricle: The cavity size was normal. Wall thickness was  increased in a pattern of mild LVH. Systolic function was normal. The estimated ejection fraction was in the range of 55% to 60%. - Aortic valve: The aortic valve is bicuspid and is heavily calcified with restricted leaflet motion. Peak and mean gradients through the valve are 100 and 59 mm Hg  respectively, consistent with severe AS. The calculated AVA is 0.6 cm2. There is moderate, eccentric AI. There was mild regurgitation. - Aorta: Aortic root dimension: 39 mm (ED). - Aortic root: The aortic root is mildly dilated. - Mitral valve: Calcified annulus. Mildly thickened leaflets . There was mild regurgitation. - Left atrium: LA Volume/BSA= 46.4 ml/m2. The atrium is moderately dilated.  Impressions:  - LVEF 60-65%, moderate concentric LVH, bicuspid and severely stenotic aortic valve- AVA 0.6 cm2, peak and mean gradients of 100 mmHg and 59 mmHg, respectively - LVOT diameter measured at 2.4 cm. There is moderate AI. There is mild to moderate MR with restricted anterior leaflet motion secondary to eccentric AI.  Pedoff measurements do indeed show a very high mean gradient of 59 and peak gradient of 100 mmHg. Based on a generous LVOT diameter of 2.4 cm, the calculated aortic valve gradient was 0.6 cm consistent with severe aortic stenosis.  Risks / Complications include, but not limited to: Death, MI, CVA/TIA, VF/VT (with defibrillation), Bradycardia (need for temporary pacer placement), contrast induced nephropathy, bleeding / bruising / hematoma / pseudoaneurysm, vascular or coronary injury (with possible emergent CT or Vascular Surgery), adverse medication reactions, infection.   Consent: Risks of procedure as well as the alternatives and risks of each were explained to the (patient/caregiver). Consent for procedure obtained.  Procedure: The patient was brought to the [facility] in the fasting state and prepped and draped in the usual sterile fashion for (Right radial) access. A modified Allen's test with plethysmography was performed on the right wrist demonstrating adequate Ulnar Artery collateral flow.   Time Out: Verified patient identification, verified procedure, site/side was marked, verified correct patient position,  special equipment/implants available, radiation safety measures in place (including badges and shielding), medications/allergies/relevent history reviewed, required imaging and test results available. Performed  Procedure: The right wrist was anesthetized with 1% subcutaneous Lidocaine. The right radial artery was accessed using the Seldinger Technique with placement of a 6 Fr Glide Sheath - this required 50 mcg of subcutaneous nitroglycerin due to radial spasm. The sheath was aspirated and flushed. Then a total of 10 ml of standard Radial Artery Cocktail (see medications) was infused. A 5 Fr TIG 4.0 Catheter was advanced of over a Safety J wire into the ascending Aorta. The catheter was not able to engage the coronary arteries. Multiple catheter exchanges were made and eventually a 83F JL3.5 catheter was used to engage the left coronary artery system. Multiple cineangiographic views of the left coronary system were made by hand injection. This catheter was then exchanged for a 83F JR4 and eventually a 83F 3DRC catheter, which successfully engaged a small, non-dominant right coronary. Multiple cineangiographic views of the right coronary artery system(s) was performed. This catheter was then exchanged over the Long Exchange Safety J wire for an angled Pigtail catheter that was advanced across the Aortic Valve. LV hemodynamics were measured and the catheter was pulled back across the Aortic Valve for measurement of "pull-back" gradient. The catheter and the wire were removed completely out of the body.  The sheath was removed in the Cath Lab with a TR band placed at 15 ml Air  at 1600 (time). Reverse Allen's test did not reveal non-occlusive hemostasis.  Recovery: The patient was transported to the cath lab holding area in stable condition.  The patient was noted to have a vagal reaction during the procedure. At which point, the catheterization was held and he was given a 250 cc saline bolus and 0.5  mg atropine. He was noted to be hypotensive and bradycardic, but that improved back to baseline by the end of the case. There were not complications.  EBL: Minimal  Medications: Premedication: none Sedation: 2 mg IV Versed, 25 mcg IV Fentanyl Contrast: 50 ml Omnipaque Local Anesthesia: 3 cc 1% lidocaine 4000 U IV Heparin 15 cc radial cocktail 0.5 mg IV atropine 50 mcg Subcutaneous nitroglycerin  Hemodynamics: Central Aortic Pressure / Mean Aortic Pressure: 140/76 LV Pressure / LV End diastolic Pressure: N/A  Coronary Angiographic Data:  Left Main: Normal, bifurcates into the LAD and LCX systems.  Left Anterior Descending (LAD): Mild luminal irregularities  1st diagonal (D1): No stenosis.  Circumflex (LCx): Dominant. Bifurcates in a pitchfork fashion on the lateral wall  1st obtuse marginal: No stenosis.  posterior lateral branch: No stenosis.  Right Coronary Artery: Non-dominant vessel, small  posterior descending artery: no stenosis  posterior lateral branch: No stenosis  Impression: 1. No significant angiographic stenosis. 2. Heavily calcified aortic valve. 3. Dilated aortic root.  Plan: 1. Plan for aortic valve replacement per Dr. Cyndia Bent.  The case and results was discussed with the patient and family if available.  The case and results was not discussed with the patient's PCP. The case and results was discussed with the patient's Cardiologist.  Time Spent Directly with the Patient:  38 minutes  Frank Casino, MD, [facility] Attending Cardiologist [facility]  Frank [name],Kenneth C 10/26/2014, 4:16 PM   CLINICAL DATA: 49 year old male with aortic stenosis. Preoperative evaluation prior to valve replacement.  EXAM: CT ANGIOGRAPHY CHEST WITH CONTRAST  TECHNIQUE: Multidetector CT imaging of the chest was performed  using the standard protocol during bolus administration of intravenous contrast. Multiplanar CT image reconstructions and MIPs were obtained to evaluate the vascular anatomy.  CONTRAST: 76mL OMNIPAQUE IOHEXOL 350 MG/ML SOLN  COMPARISON: None.  FINDINGS: Mediastinum: Unremarkable CT appearance of the thyroid gland. No suspicious mediastinal or hilar adenopathy. No soft tissue mediastinal mass. Small hiatal hernia.  Heart/Vascular: Conventional 3 vessel aortic arch anatomy. No evidence of dissection. Aneurysmal dilatation of the tubular portion of the ascending thoracic aorta to a maximal transverse diameter of 4.4 cm (measured axially at the level of the right main pulmonary artery). The aortic valve is densely calcified. No effacement of the sino-tubular junction. The aortic root remains within normal limits. The arch and descending thoracic aorta are also normal in caliber. Mild cardiomegaly with both concentric hypertrophy of the left ventricle and mild left ventricular dilatation. Small calcifications are present along the main, left anterior descending, circumflex and right coronary arteries. No pericardial effusion.  Lungs/Pleura: No evidence of pleural effusion. The lungs are clear.  Bones/Soft Tissues: No acute fracture or aggressive appearing lytic or blastic osseous lesion.  Upper Abdomen: Visualized upper abdominal organs are unremarkable.  Review of the MIP images confirms the above findings.  IMPRESSION: 1. Thickened and densely calcified aortic valve consistent with the clinical history of aortic stenosis. 2. Fusiform aneurysmal dilatation of the tubular portion of the ascending thoracic aorta to a maximal diameter of 4.3- 4.4 cm. There is no effacement of the sino-tubular junction. The arch and descending thoracic aorta are within  normal limits in caliber. Recommend followup by ultrasound in 1 year. This recommendation follows ACR consensus  guidelines: White Paper of the ACR Incidental Findings Committee II on Vascular Findings. J Am Coll Radiol 2013; 10:789-794. 3. Mild cardiomegaly with both concentric hypertrophy of the left ventricle and mild left ventricular dilatation. Similar findings can be seen in the setting of aortic stenosis with regurgitation. 4. Left main and additional 3 vessel coronary artery calcifications. Please note that although the presence of coronary artery calcium documents the presence of coronary artery disease, the severity of this disease and any potential stenosis cannot be assessed on this non-gated CT examination. Assessment for potential risk factor modification, dietary therapy or pharmacologic therapy may be warranted, if clinically indicated. 5. Small hiatal hernia. Signed,  Criselda Peaches, MD  [facility]  [facility]   Electronically Signed  By: Jacqulynn Cadet M.D.  On: 10/06/2014 14:53   Impression:  He has severe bicuspid aortic valve stenosis with a 4.4 cm ascending aortic aneurysm. I think the best treatment for this 49 year old gentleman is replacement of his valve and dilated ascending aorta. His aorta is almost at the 4.5 cm threshold that is usually recommended for replacement of the aorta for patients with bicuspid valves undergoing replacement. Since he is young this will avoid the problem of likely further enlargement requiring a later redo surgery and the risk of aortic dissection. He has decided that he wants a mechanical valve so I would plan to do a Bentall procedure using a St. Jude mechanical valved graft. I discussed the operative procedure with the patient and his wife including alternatives, benefits and risks; including but not limited to bleeding, blood transfusion, infection, stroke, myocardial infarction, graft failure, heart block requiring a permanent pacemaker, organ dysfunction, and death.  Frank [name] understands and agrees to proceed.  We will schedule surgery for Monday 11/27/2014  Plan:  Deneen Harts procedure using a mechanical valved graft on 11/27/2014   [name], MD [facility] [phone]

## 2014-11-08 ENCOUNTER — Encounter: Payer: Self-pay | Admitting: Internal Medicine

## 2014-11-23 ENCOUNTER — Encounter (HOSPITAL_COMMUNITY)
Admission: RE | Admit: 2014-11-23 | Discharge: 2014-11-23 | Disposition: A | Discharge status: Home / Self Care | Payer: 59 | Source: Ambulatory Visit | Attending: Surgery | Admitting: Surgery

## 2014-11-23 ENCOUNTER — Inpatient Hospital Stay (HOSPITAL_COMMUNITY)
Admission: RE | Admit: 2014-11-23 | Discharge: 2014-11-23 | Disposition: A | Discharge status: Home / Self Care | Payer: 59 | Source: Ambulatory Visit | Attending: Surgery | Admitting: Surgery

## 2014-11-23 ENCOUNTER — Ambulatory Visit (HOSPITAL_COMMUNITY)
Admission: RE | Admit: 2014-11-23 | Discharge: 2014-11-23 | Disposition: A | Discharge status: Home / Self Care | Payer: 59 | Source: Ambulatory Visit | Attending: Surgery | Admitting: Surgery

## 2014-11-23 ENCOUNTER — Encounter (HOSPITAL_COMMUNITY): Payer: Self-pay

## 2014-11-23 VITALS — BP 122/59 | HR 82 | Temp 98.6°F | Resp 20 | Ht 69.0 in | Wt 178.5 lb

## 2014-11-23 DIAGNOSIS — I350 Nonrheumatic aortic (valve) stenosis: Secondary | ICD-10-CM

## 2014-11-23 DIAGNOSIS — I7121 Aneurysm of the ascending aorta, without rupture: Secondary | ICD-10-CM

## 2014-11-23 DIAGNOSIS — Z0183 Encounter for blood typing: Secondary | ICD-10-CM | POA: Diagnosis not present

## 2014-11-23 DIAGNOSIS — Z01812 Encounter for preprocedural laboratory examination: Secondary | ICD-10-CM | POA: Insufficient documentation

## 2014-11-23 DIAGNOSIS — R9431 Abnormal electrocardiogram [ECG] [EKG]: Secondary | ICD-10-CM | POA: Diagnosis not present

## 2014-11-23 DIAGNOSIS — Q231 Congenital insufficiency of aortic valve: Secondary | ICD-10-CM | POA: Insufficient documentation

## 2014-11-23 DIAGNOSIS — R55 Syncope and collapse: Secondary | ICD-10-CM | POA: Diagnosis not present

## 2014-11-23 DIAGNOSIS — I712 Thoracic aortic aneurysm, without rupture: Secondary | ICD-10-CM | POA: Insufficient documentation

## 2014-11-23 DIAGNOSIS — Z01818 Encounter for other preprocedural examination: Secondary | ICD-10-CM | POA: Insufficient documentation

## 2014-11-23 DIAGNOSIS — K219 Gastro-esophageal reflux disease without esophagitis: Secondary | ICD-10-CM | POA: Diagnosis not present

## 2014-11-23 DIAGNOSIS — E780 Pure hypercholesterolemia: Secondary | ICD-10-CM | POA: Insufficient documentation

## 2014-11-23 HISTORY — DX: Gastro-esophageal reflux disease without esophagitis: K21.9

## 2014-11-23 LAB — COMPREHENSIVE METABOLIC PANEL
ALT: 25 U/L (ref 0–53)
AST: 29 U/L (ref 0–37)
Albumin: 4.4 g/dL (ref 3.5–5.2)
Alkaline Phosphatase: 64 U/L (ref 39–117)
Anion gap: 9 (ref 5–15)
BUN: 15 mg/dL (ref 6–23)
CO2: 25 mmol/L (ref 19–32)
CREATININE: 1.25 mg/dL (ref 0.50–1.35)
Calcium: 9.6 mg/dL (ref 8.4–10.5)
Chloride: 101 mmol/L (ref 96–112)
GFR calc Af Amer: 77 mL/min — ABNORMAL LOW (ref >90)
GFR, EST NON AFRICAN AMERICAN: 66 mL/min — AB (ref >90)
Glucose, Bld: 120 mg/dL — ABNORMAL HIGH (ref 70–99)
Potassium: 3.7 mmol/L (ref 3.5–5.1)
Sodium: 135 mmol/L (ref 135–145)
TOTAL PROTEIN: 7.3 g/dL (ref 6.0–8.3)
Total Bilirubin: 0.7 mg/dL (ref 0.3–1.2)

## 2014-11-23 LAB — URINALYSIS, ROUTINE W REFLEX MICROSCOPIC
Bilirubin Urine: NEGATIVE
Glucose, UA: NEGATIVE mg/dL
Hgb urine dipstick: NEGATIVE
Ketones, ur: NEGATIVE mg/dL
Leukocytes, UA: NEGATIVE
NITRITE: NEGATIVE
PROTEIN: NEGATIVE mg/dL
Specific Gravity, Urine: 1.016 (ref 1.005–1.030)
Urobilinogen, UA: 0.2 mg/dL (ref 0.0–1.0)
pH: 5.5 (ref 5.0–8.0)

## 2014-11-23 LAB — PULMONARY FUNCTION TEST
DL/VA % PRED: 114 %
DL/VA: 5.24 ml/min/mmHg/L
DLCO unc % pred: 95 %
DLCO unc: 29.74 ml/min/mmHg
FEF 25-75 Post: 4.5 L/sec
FEF 25-75 Pre: 3 L/sec
FEF2575-%Change-Post: 50 %
FEF2575-%PRED-PRE: 88 %
FEF2575-%Pred-Post: 132 %
FEV1-%CHANGE-POST: 9 %
FEV1-%PRED-PRE: 84 %
FEV1-%Pred-Post: 92 %
FEV1-POST: 3.52 L
FEV1-Pre: 3.21 L
FEV1FVC-%Change-Post: 5 %
FEV1FVC-%Pred-Pre: 101 %
FEV6-%Change-Post: 4 %
FEV6-%Pred-Post: 88 %
FEV6-%Pred-Pre: 84 %
FEV6-PRE: 4.01 L
FEV6-Post: 4.2 L
FEV6FVC-%Change-Post: 1 %
FEV6FVC-%PRED-PRE: 101 %
FEV6FVC-%Pred-Post: 103 %
FVC-%Change-Post: 3 %
FVC-%Pred-Post: 85 %
FVC-%Pred-Pre: 82 %
FVC-POST: 4.2 L
FVC-Pre: 4.06 L
POST FEV1/FVC RATIO: 84 %
PRE FEV6/FVC RATIO: 99 %
Post FEV6/FVC ratio: 100 %
Pre FEV1/FVC ratio: 79 %
RV % pred: 97 %
RV: 1.93 L
TLC % PRED: 86 %
TLC: 5.83 L

## 2014-11-23 LAB — CBC
HEMATOCRIT: 41.2 % (ref 39.0–52.0)
Hemoglobin: 14.6 g/dL (ref 13.0–17.0)
MCH: 29.7 pg (ref 26.0–34.0)
MCHC: 35.4 g/dL (ref 30.0–36.0)
MCV: 83.7 fL (ref 78.0–100.0)
PLATELETS: 182 10*3/uL (ref 150–400)
RBC: 4.92 MIL/uL (ref 4.22–5.81)
RDW: 13.7 % (ref 11.5–15.5)
WBC: 11.5 10*3/uL — ABNORMAL HIGH (ref 4.0–10.5)

## 2014-11-23 LAB — PROTIME-INR
INR: 0.99 (ref 0.00–1.49)
Prothrombin Time: 13.2 seconds (ref 11.6–15.2)

## 2014-11-23 LAB — ABO/RH: ABO/RH(D): O POS

## 2014-11-23 LAB — APTT: APTT: 29 s (ref 24–37)

## 2014-11-23 LAB — TYPE AND SCREEN
ABO/RH(D): O POS
Antibody Screen: NEGATIVE

## 2014-11-23 LAB — SURGICAL PCR SCREEN
MRSA, PCR: NEGATIVE
Staphylococcus aureus: POSITIVE — AB

## 2014-11-23 MED ORDER — ALBUTEROL SULFATE (2.5 MG/3ML) 0.083% IN NEBU
2.5000 mg | INHALATION_SOLUTION | Freq: Once | RESPIRATORY_TRACT | Status: AC
  Administered 2014-11-23: 2.5 mg via RESPIRATORY_TRACT
  Filled 2014-11-23: qty 3

## 2014-11-23 NOTE — Progress Notes (Signed)
Anesthesia PAT Evaluation:  Patient is a 49 year old male scheduled for Bentall Procedure on 11/27/14 by Dr. Cyndia Bent.  History includes non-smoker, bicuspid AV with severe AS, ascending aortic aneurysm, GERD, PVC's, hypercholesterolemia. PCP is listed as Dr. Deborra Medina. Cardiologist is Dr. Debara Pickett (formerly saw Dr. [name]).    I was asked to see patient during PAT due to brief syncope episode (< 30 seconds) that occurred during the radial artery stick for his ABG.  Per Cammie Sickle, RN report: Chrissy in lab called and asked her to assess patient because he reported not feeling well.  Shortly after Lakea's arrival, his head dropped to his chest and did a few jerking motions. He did not fall. Ammonia scent was placed under his nose and he came to. Vitals rechecked and showed BP 128/67, HR 87, O2 sat 100%. EKG as described below. Apparently rapid response RN was called but patient's color was again pink and vitals stable, so he was not assessed. I did see patient.  He denied any chest pain or SOB at rest. He has does get occasional exertional chest pain and mild DOE over the past few months which is not new. He reported having a very similar episode during his cardiac cath which was done via right radial artery (this was documented as a vagal reaction on his cath report). Patient was completely asymptomatic when I saw patient.  Exam showed heart RRR, with 3/6 harsh SEM heard best on right sternal border.  Lungs clear.  Facial symmetry.  Good hand grips. Steady gait.   11/23/14 EKG: NSR, possible LAE, cannot rule out anterior infarct (age undetermined), ST/T wave abnormality, consider inferolateral ischemia. New T wave inversion in V5 and V6 since 10/26/14, but present on 07/24/14 EKG.  10/26/14 Cardiac cath: Impression: 1. No significant angiographic stenosis. 2. Heavily calcified aortic valve. 3. Dilated aortic root. The patient was noted to have a vagal reaction during the procedure. At which point, the  catheterization was held and he was given a 250 cc saline bolus and 0.5 mg atropine. He was noted to be hypotensive and bradycardic, but that improved back to baseline by the end of the case.  09/04/14 Echo: - Left ventricle: The cavity size was normal. Wall thickness was increased in a pattern of mild LVH. Systolic function was normal. The estimated ejection fraction was in the range of 55% to 60%. - Aortic valve: The aortic valve is bicuspid and is heavily calcified with restricted leaflet motion. Peak and mean gradients through the valve are 100 and 59 mm Hg respectively, consistent with severe AS. The calculated AVA is 0.6 cm2. There is moderate, eccentric AI. There was mild regurgitation. - Aorta: Aortic root dimension: 39 mm (ED). - Aortic root: The aortic root is mildly dilated. - Mitral valve: Calcified annulus. Mildly thickened leaflets . There was mild regurgitation. - Left atrium: LA Volume/BSA= 46.4 ml/m2. The atrium is moderately dilated.  11/23/14 CXR: No active cardiopulmonary process. Calcified aortic valve and poststenotic dilatation of the ascending aorta consistent with aortic stenosis.  10/06/14 CTA of the Chest: IMPRESSION: 1. Thickened and densely calcified aortic valve consistent with the clinical history of aortic stenosis. 2. Fusiform aneurysmal dilatation of the tubular portion of the ascending thoracic aorta to a maximal diameter of 4.3- 4.4 cm. There is no effacement of the sino-tubular junction. The arch and descending thoracic aorta are within normal limits in caliber. 3. Mild cardiomegaly with both concentric hypertrophy of the left ventricle and mild left ventricular dilatation.  Similar findings can be seen in the setting of aortic stenosis with regurgitation. 4. Left main and additional 3 vessel coronary artery calcifications. 5. Small hiatal hernia.  11/23/14 PFTs: FVC 4.06 (82%), FEV1 3.21 (84%), DLCOunc 29.74 (95%).  Preoperative labs noted.  A1C is pending.  PAT RN notified Dr. Vivi Martens OR nursing staff of syncopal episode. I spoke with Jadene Pierini, PA-C.  Recommend to notify cardiology to ensure no further recommendations.  I called and spoke with Dr. Martinique.  Patient back at baseline.  He has known severe AS with similar response with radial stick last month. Suspect another vasovagal reaction. Dr. Martinique agrees with patient being discharged home from PAT.    Anticipate he can proceed with surgery as planned.  He will need his ABG repeated on the day of surgery.  [facility] Short Stay Center/Anesthesiology Phone [phone] 11/23/2014 4:11 PM

## 2014-11-23 NOTE — Pre-Procedure Instructions (Signed)
Frank [name]  11/23/2014   Your procedure is scheduled on:  Monday, April 11th   Report to [facility] Admitting at 5:30 AM.   Call this number if you have problems the morning of surgery: [phone]   Remember:   Do not eat food or drink liquids after midnight Sunday.   Take these medicines the morning of surgery with A SIP OF WATER: Xanax, Omeprazole   Do not wear jewelry - no rings or watches.  Do not wear lotions or colognes.   You may NOT wear deodorant the day of surgery.   Men may shave face and neck.   Do not bring valuables to the hospital.  [facility] is not responsible for any belongings or valuables.               Contacts, dentures or bridgework may not be worn into surgery.  Leave suitcase in the car. After surgery it may be brought to your room.  For patients admitted to the hospital, discharge time is determined by your treatment team.    Name and phone number of your driver:    Special Instructions: "Preparing for Surgery" instruction sheet.   Please read over the following fact sheets that you were given: Pain Booklet, Coughing and Deep Breathing, Blood Transfusion Information, MRSA Information and Surgical Site Infection Prevention

## 2014-11-23 NOTE — Progress Notes (Signed)
Nurse called Mupirocin ointment in to [facility] pharmacy. Nurse then attempted to call patient however after phone rang several times and message came on stating mailbox was full and no voicemail could be left. Will attempt to call again at a later time.

## 2014-11-23 NOTE — Progress Notes (Signed)
Pre-op Cardiac Surgery  Carotid Findings:  1-39% ICA stenosis.  Vertebral artery flow is antegrade.   Upper Extremity Right Left  Brachial Pressures 120T 129T  Radial Waveforms T T  Ulnar Waveforms T T  Palmar Arch (Allen's Test) WNL WNL   Findings:      Lower  Extremity Right Left  Dorsalis Pedis    Anterior Tibial    Posterior Tibial    Ankle/Brachial Indices      Findings:

## 2014-11-23 NOTE — Progress Notes (Addendum)
While having blood drawn for ABG, pt vagalled, got diaphoretic, pasty colored, did not respond to verbalizations. Rapid response called, Lab tech popped ammonia stick and pt started coming around. BP  119/74 ,  HR  81   Cool cloth applied, fresh air initiated.  After sitting for an additional 15 minutes, pt stated he felt better and EKG was done.  Slight changes noted in EKG, and A. Zelenak, PA spoke with  Evonnie Pat, PA for CVTS and he suggested we call cardiology.  Sigmund Hazel, PA has paged [facility]. ABG to be drawn DOS.   da

## 2014-11-24 LAB — HEMOGLOBIN A1C
Hgb A1c MFr Bld: 5.8 % — ABNORMAL HIGH (ref 4.8–5.6)
Mean Plasma Glucose: 120 mg/dL

## 2014-11-26 MED ORDER — METOPROLOL TARTRATE 12.5 MG HALF TABLET
12.5000 mg | ORAL_TABLET | Freq: Once | ORAL | Status: AC
  Administered 2014-11-27: 12.5 mg via ORAL
  Filled 2014-11-26: qty 1

## 2014-11-26 MED ORDER — SODIUM CHLORIDE 0.9 % IV SOLN
INTRAVENOUS | Status: DC
  Filled 2014-11-26: qty 30

## 2014-11-26 MED ORDER — CEFUROXIME SODIUM 1.5 G IJ SOLR
1.5000 g | INTRAMUSCULAR | Status: AC
  Administered 2014-11-27: .75 g via INTRAVENOUS
  Administered 2014-11-27: 1.5 g via INTRAVENOUS
  Filled 2014-11-26 (×2): qty 1.5

## 2014-11-26 MED ORDER — AMINOCAPROIC ACID 250 MG/ML IV SOLN
INTRAVENOUS | Status: AC
  Administered 2014-11-27: 14 mL/h via INTRAVENOUS
  Administered 2014-11-27: 69.8 mL/h via INTRAVENOUS
  Filled 2014-11-26: qty 40

## 2014-11-26 MED ORDER — PLASMA-LYTE 148 IV SOLN
INTRAVENOUS | Status: AC
  Administered 2014-11-27: 07:00:00
  Filled 2014-11-26: qty 2.5

## 2014-11-26 MED ORDER — EPINEPHRINE HCL 1 MG/ML IJ SOLN
0.0000 ug/min | INTRAVENOUS | Status: DC
  Filled 2014-11-26: qty 4

## 2014-11-26 MED ORDER — DEXMEDETOMIDINE HCL IN NACL 400 MCG/100ML IV SOLN
0.1000 ug/kg/h | INTRAVENOUS | Status: AC
  Administered 2014-11-27: 0.2 ug/kg/h via INTRAVENOUS
  Filled 2014-11-26: qty 100

## 2014-11-26 MED ORDER — INSULIN REGULAR HUMAN 100 UNIT/ML IJ SOLN
INTRAMUSCULAR | Status: AC
  Administered 2014-11-27: 1.4 [IU]/h via INTRAVENOUS
  Filled 2014-11-26: qty 2.5

## 2014-11-26 MED ORDER — PHENYLEPHRINE HCL 10 MG/ML IJ SOLN
30.0000 ug/min | INTRAMUSCULAR | Status: AC
  Administered 2014-11-27: 25 ug/min via INTRAVENOUS
  Filled 2014-11-26: qty 2

## 2014-11-26 MED ORDER — POTASSIUM CHLORIDE 2 MEQ/ML IV SOLN
80.0000 meq | INTRAVENOUS | Status: DC
  Filled 2014-11-26: qty 40

## 2014-11-26 MED ORDER — VANCOMYCIN HCL 10 G IV SOLR
1250.0000 mg | INTRAVENOUS | Status: AC
  Administered 2014-11-27: 1250 mg via INTRAVENOUS
  Filled 2014-11-26: qty 1250

## 2014-11-26 MED ORDER — DOPAMINE-DEXTROSE 3.2-5 MG/ML-% IV SOLN
0.0000 ug/kg/min | INTRAVENOUS | Status: DC
  Filled 2014-11-26: qty 250

## 2014-11-26 MED ORDER — NITROGLYCERIN IN D5W 200-5 MCG/ML-% IV SOLN
2.0000 ug/min | INTRAVENOUS | Status: DC
  Filled 2014-11-26: qty 250

## 2014-11-26 MED ORDER — MAGNESIUM SULFATE 50 % IJ SOLN
40.0000 meq | INTRAMUSCULAR | Status: DC
  Filled 2014-11-26: qty 10

## 2014-11-26 MED ORDER — CEFUROXIME SODIUM 750 MG IJ SOLR
750.0000 mg | INTRAMUSCULAR | Status: DC
  Filled 2014-11-26: qty 750

## 2014-11-26 NOTE — H&P (Signed)
[address]       [place] [address]             [phone]      Cardiothoracic Surgery History and Physical    PCP is [name], MD Referring Provider is Debara Pickett Nadean Corwin., MD  Chief Complaint  Patient presents with  . 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 [name] at [facility]. He underwent ACL repair by Dr. [name] on 08/01/2014 and was noted to have a loud AS murmur by anesthesia. Intraop TEE showed severe AS, moderate AI and mild to moderate MR. He got through surgery uneventfully and has been recovering nicely. He had a 2D echo on 09/04/2014 which shows severe AS with a mean gradient of 59 mm Hg and a peak of 100 mm Hg. There aortic valve is bicuspid and heavily calcified with restricted leaflet motion. AVA was 0.6 cm2. There was mild MR and mild aortic root dilatation. The LVEF was 55-60% with mild LVH. He says that he was not having any symptoms prior to his knee surgery but now notes exertional fatigue, chest pressure and shortness of breath. His wife notes that he sleeps a lot. CTA of the chest on 10/06/2014 shows fusiform aneurysmal enlargement of the ascending aorta to 4.4 cm. Cardiac cath on 10/26/2014 shows no significant coronary disease with a dilated aortic root.  Past Medical History  Diagnosis Date  . Congenital heart valve abnormality     bicuspid aortic valve  . PONV (postoperative nausea and vomiting)     after a tonsillectomy..none since  . Hypertension   . Dysrhythmia     PVC's  . Hypercholesterolemia     Past Surgical History  Procedure Laterality Date  . Inguinal hernia repair  2006    left, Dr. Bary Castilla  . Tonsillectomy  2001  . Anterior cruciate ligament repair Right 08/01/2014      Procedure: RECONSTRUCTION ANTERIOR CRUCIATE LIGAMENT (ACL) WITH HAMSTRING GRAFT, MENISCAL DEBRIDEMENT AS NEEDED.; Surgeon: Meredith Pel, MD; Location: [facility]; Service: Orthopedics; Laterality: Right;  . Tee without cardioversion N/A 08/01/2014    Procedure: TRANSESOPHAGEAL ECHOCARDIOGRAM (TEE); Surgeon: Meredith Pel, MD; Location: [facility]; Service: Orthopedics; Laterality: N/A;    Family History  Problem Relation Age of Onset  . Arthritis Mother     psoriatis, Crohn's   . Heart disease Father     CAD in his early 22s    Social History History  Substance Use Topics  . Smoking status: Never Smoker   . Smokeless tobacco: Never Used  . Alcohol Use: 4.2 oz/week    7 Glasses of wine per week     Comment: occasional, drink 1 beer per wk    Current Outpatient Prescriptions  Medication Sig Dispense Refill  . ALPRAZolam (XANAX) 0.5 MG tablet Take 1 tablet (0.5 mg total) by mouth at bedtime as needed. 90 tablet 0  . Inositol Niacinate (NIACIN FLUSH FREE) 500 MG CAPS Take 1,000 mg by mouth daily. Take 2 by mouth daily    . omeprazole (PRILOSEC) 40 MG capsule Take 1 capsule by mouth daily 90 capsule 1  . Red Yeast Rice 600 MG CAPS Take 1,200 mg by mouth daily. Take two by mouth twice a day.  No current facility-administered medications for this visit.    Allergies  Allergen Reactions  . Statins Other (See Comments)    Muscle ache    Review of Systems  Constitutional: Positive for activity change and fatigue. Negative for fever, chills, appetite change and unexpected weight change.  HENT: Negative.   Saw his dentist 06/2014  Eyes: Negative.  Respiratory: Positive for shortness of breath.   With exertion  Cardiovascular: Positive for chest pain. Negative for palpitations and leg swelling.   With exertion  Gastrointestinal:   Reflux  Endocrine:  Negative.  Genitourinary: Negative.  Musculoskeletal:   Recovering from right knee ACL repair  Skin: Negative.  Allergic/Immunologic: Negative.  Neurological: Negative for dizziness and syncope.  Hematological: Negative.  Psychiatric/Behavioral: Negative.    BP 137/85 mmHg  Pulse 92  Resp 20  Ht 5' 9.5" (1.765 m)  Wt 174 lb (78.926 kg)  BMI 25.34 kg/m2  SpO2 98% Physical Exam  Constitutional: He is oriented to person, place, and time. He appears well-developed and well-nourished. No distress.  HENT:  Head: Normocephalic and atraumatic.  Mouth/Throat: Oropharynx is clear and moist.  Eyes: EOM are normal. Pupils are equal, round, and reactive to light.  Neck: Normal range of motion. Neck supple. No JVD present. No thyromegaly present.  Cardiovascular: Normal rate, regular rhythm and intact distal pulses.  Murmur heard. 3/6 harsh crescendo/decrescendo murmur along RSB  Pulmonary/Chest: Effort normal and breath sounds normal. No respiratory distress. He has no wheezes. He has no rales.  Abdominal: Soft. Bowel sounds are normal. He exhibits no distension and no mass. There is no tenderness.  Musculoskeletal: Normal range of motion. He exhibits no edema.  Lymphadenopathy:   He has no cervical adenopathy.  Neurological: He is alert and oriented to person, place, and time. He has normal strength. No cranial nerve deficit or sensory deficit.  Skin: Skin is warm and dry.  Psychiatric: He has a normal mood and affect.     Diagnostic Tests:         *[facility] [place], [address]            [place], [place] [address]              [phone]  ------------------------------------------------------------------- Echocardiography  (Report amended )  Patient:  Frank [name], Frank [name] MR #:    [id number] Study Date: 09/04/2014 Gender:   M Age:    63 Height:   175.3  cm Weight:   76.7 kg BSA:    1.94 m^2 Pt. Status: Room:  ATTENDING  Kirk Ruths ORDERING   Cecilie Kicks R REFERRING  Isaiah Serge PERFORMING  [facility], Outpatient SONOGRAPHER [facility], RDCS  cc:  ------------------------------------------------------------------- LV EF: 55% -  60%  ------------------------------------------------------------------- Indications:   Aortic Valve Disease (I35.0).  ------------------------------------------------------------------- History:  PMH: History of Bicuspid Aortic Valve, Palpitations  ------------------------------------------------------------------- Study Conclusions  - Left ventricle: The cavity size was normal. Wall thickness was increased in a pattern of mild LVH. Systolic function was normal. The estimated ejection fraction was in the range of 55% to 60%. - Aortic valve: The aortic valve is bicuspid and is heavily calcified with restricted leaflet motion. Peak and mean gradients through the valve are 100 and 59 mm Hg respectively, consistent with severe AS. The calculated AVA is 0.6 cm2. There is moderate, eccentric AI. There was mild regurgitation. - Aorta: Aortic root dimension: 39 mm (ED). - Aortic root: The aortic root  is mildly dilated. - Mitral valve: Calcified annulus. Mildly thickened leaflets . There was mild regurgitation. - Left atrium: LA Volume/BSA= 46.4 ml/m2. The atrium is moderately dilated.  Impressions:  - LVEF 60-65%, moderate concentric LVH, bicuspid and severely stenotic aortic valve- AVA 0.6 cm2, peak and mean gradients of 100 mmHg and 59 mmHg, respectively - LVOT diameter measured at 2.4 cm. There is moderate AI. There is mild to moderate MR with restricted anterior leaflet motion secondary to eccentric AI.  Echocardiography. M-mode, complete 2D, spectral Doppler, and color Doppler. Birthdate: Patient birthdate: [birth year]. Age:  Patient is 49 yr old. Sex: Gender: male.  BMI: 25 kg/m^2. Blood pressure:   142/80 Patient status: Outpatient. Study date: Study date: 09/04/2014. Study time: 08:52 AM. Location: Echo laboratory.  -------------------------------------------------------------------  ------------------------------------------------------------------- Left ventricle: The cavity size was normal. Wall thickness was increased in a pattern of mild LVH. Systolic function was normal. The estimated ejection fraction was in the range of 55% to 60%.  ------------------------------------------------------------------- Aortic valve: The aortic valve is bicuspid and is heavily calcified with restricted leaflet motion. Peak and mean gradients through the valve are 100 and 59 mm Hg respectively, consistent with severe AS. The calculated AVA is 0.6 cm2. There is moderate, eccentric AI. Doppler: There was mild regurgitation.  VTI ratio of LVOT to aortic valve: 0.13. Valve area (VTI): 0.59 cm^2. Indexed valve area (VTI): 0.3 cm^2/m^2. Peak velocity ratio of LVOT to aortic valve: 0.13. Valve area (Vmax): 0.59 cm^2. Indexed valve area (Vmax): 0.3 cm^2/m^2. Mean velocity ratio of LVOT to aortic valve: 0.11. Valve area (Vmean): 0.52 cm^2. Indexed valve area (Vmean): 0.27 cm^2/m^2.  Mean gradient (S): 59 mm Hg. Peak gradient (S): 100 mm Hg.  ------------------------------------------------------------------- Aorta: Aortic root: The aortic root is mildly dilated. Ascending aorta: The ascending aorta was normal in size.  ------------------------------------------------------------------- Mitral valve:  Calcified annulus. Mildly thickened leaflets . Doppler: There was mild regurgitation.  Peak gradient (D): 9 mm Hg.  ------------------------------------------------------------------- Left atrium: LA Volume/BSA= 46.4 ml/m2. The atrium is  moderately dilated.  ------------------------------------------------------------------- Right ventricle: The cavity size was normal. Wall thickness was normal. Systolic function was normal.  ------------------------------------------------------------------- Pulmonic valve:  Structurally normal valve.  Cusp separation was normal. Doppler: Transvalvular velocity was within the normal range. There was mild regurgitation.  ------------------------------------------------------------------- Tricuspid valve:  Structurally normal valve.  Leaflet separation was normal. Doppler: Transvalvular velocity was within the normal range. There was trivial regurgitation.  ------------------------------------------------------------------- Right atrium: The atrium was normal in size.  ------------------------------------------------------------------- Pericardium: There was no pericardial effusion.  ------------------------------------------------------------------- Systemic veins: Inferior vena cava: The vessel was normal in size. The respirophasic diameter changes were in the normal range (= 50%), consistent with normal central venous pressure. Diameter: 18.8 mm.  ------------------------------------------------------------------- Measurements  IVC                   Value      Reference ID                    18.8  mm    ---------  Left ventricle              Value      Reference LV ID, ED, PLAX chordal     (H)   55.3  mm    43 - 52 LV ID, ES, PLAX chordal     (H)   41   mm    23 - 38 LV fx shortening, PLAX chordal  (L)   26   %    >=  29 LV PW thickness, ED           12.9  mm    --------- IVS/LV PW ratio, ED           1        <=1.3 Stroke volume, 2D            61   ml    --------- Stroke volume/bsa, 2D          31    ml/m^2  --------- LV e&', lateral              9.1  cm/s   --------- LV E/e&', lateral             16.59      --------- LV e&', medial              8.33  cm/s   --------- LV E/e&', medial             18.13      --------- LV e&', average              8.72  cm/s   --------- LV E/e&', average             17.33      --------- LV ejection time             350  ms    ---------  Ventricular septum            Value      Reference IVS thickness, ED            12.9  mm    ---------  LVOT                   Value      Reference LVOT ID, S                24   mm    --------- LVOT area                4.52  cm^2   --------- LVOT peak velocity, S          65.91 cm/s   --------- LVOT mean velocity, S          40.5  cm/s   --------- LVOT VTI, S               15.7  cm    --------- Stroke volume (SV), LVOT DP       71   ml    --------- Stroke index (SV/bsa), LVOT DP      36.6  ml/m^2  ---------  Aortic valve               Value      Reference Aortic valve peak velocity, S      499.07 cm/s   --------- Aortic valve mean velocity, S      355.16 cm/s   --------- Aortic valve VTI, S           122.55 cm    --------- Aortic mean gradient, S         59   mm Hg  --------- Aortic peak gradient, S         100  mm Hg  --------- VTI ratio, LVOT/AV            0.13      --------- Aortic valve area, VTI          0.59  cm^2   --------- Aortic valve area/bsa, VTI        0.3  cm^2/m^2 --------- Velocity ratio, peak, LVOT/AV      0.13      --------- Aortic valve area, peak velocity     0.59   cm^2   --------- Aortic valve area/bsa, peak       0.3  cm^2/m^2 --------- velocity Velocity ratio, mean, LVOT/AV      0.11      --------- Aortic valve area, mean velocity     0.52  cm^2   --------- Aortic valve area/bsa, mean       0.27  cm^2/m^2 --------- velocity  Aorta                  Value      Reference Aortic root ID, ED            34   mm    ---------  Left atrium               Value      Reference LA ID, A-P, ES              38   mm    --------- LA ID/bsa, A-P              1.96  cm/m^2  <=2.2 LA volume, S               89   ml    --------- LA volume/bsa, S             45.9  ml/m^2  --------- LA volume, ES, 1-p A4C          106  ml    --------- LA volume/bsa, ES, 1-p A4C        54.6  ml/m^2  --------- LA volume, ES, 1-p A2C          64   ml    --------- LA volume/bsa, ES, 1-p A2C        33   ml/m^2  ---------  Mitral valve               Value      Reference Mitral E-wave peak velocity       151  cm/s   --------- Mitral A-wave peak velocity       49.4  cm/s   --------- Mitral deceleration time     (L)   141  ms    150 - 230 Mitral peak gradient, D         9   mm Hg  --------- Mitral E/A ratio, peak          3.1       ---------  Right ventricle             Value      Reference RV s&', lateral, S            16   cm/s   ---------  Legend: (L) and (H) mark values outside specified reference range.  ------------------------------------------------------------------- Gaspar Skeeters MD 2016-01-26T10:13:57     CLINICAL DATA: 49 year old male with aortic stenosis. Preoperative evaluation prior to valve  replacement.  EXAM: CT ANGIOGRAPHY CHEST WITH CONTRAST  TECHNIQUE: Multidetector CT imaging of the chest was performed using the standard protocol during bolus administration of intravenous contrast. Multiplanar CT image reconstructions and MIPs were obtained to evaluate the vascular anatomy.  CONTRAST: 68mL OMNIPAQUE IOHEXOL 350 MG/ML SOLN  COMPARISON:  None.  FINDINGS: Mediastinum: Unremarkable CT appearance of the thyroid gland. No suspicious mediastinal or hilar adenopathy. No soft tissue mediastinal mass. Small hiatal hernia.  Heart/Vascular: Conventional 3 vessel aortic arch anatomy. No evidence of dissection. Aneurysmal dilatation of the tubular portion of the ascending thoracic aorta to a maximal transverse diameter of 4.4 cm (measured axially at the level of the right main pulmonary artery). The aortic valve is densely calcified. No effacement of the sino-tubular junction. The aortic root remains within normal limits. The arch and descending thoracic aorta are also normal in caliber. Mild cardiomegaly with both concentric hypertrophy of the left ventricle and mild left ventricular dilatation. Small calcifications are present along the main, left anterior descending, circumflex and right coronary arteries. No pericardial effusion.  Lungs/Pleura: No evidence of pleural effusion. The lungs are clear.  Bones/Soft Tissues: No acute fracture or aggressive appearing lytic or blastic osseous lesion.  Upper Abdomen: Visualized upper abdominal organs are unremarkable.  Review of the MIP images confirms the above findings.  IMPRESSION: 1. Thickened and densely calcified aortic valve consistent with the clinical history of aortic stenosis. 2. Fusiform aneurysmal dilatation of the tubular portion of the ascending thoracic aorta to a maximal diameter of 4.3- 4.4 cm. There is no effacement of the sino-tubular junction. The arch and descending thoracic aorta are  within normal limits in caliber. Recommend followup by ultrasound in 1 year. This recommendation follows ACR consensus guidelines: White Paper of the ACR Incidental Findings Committee II on Vascular Findings. J Am Coll Radiol 2013; 10:789-794. 3. Mild cardiomegaly with both concentric hypertrophy of the left ventricle and mild left ventricular dilatation. Similar findings can be seen in the setting of aortic stenosis with regurgitation. 4. Left main and additional 3 vessel coronary artery calcifications. Please note that although the presence of coronary artery calcium documents the presence of coronary artery disease, the severity of this disease and any potential stenosis cannot be assessed on this non-gated CT examination. Assessment for potential risk factor modification, dietary therapy or pharmacologic therapy may be warranted, if clinically indicated. 5. Small hiatal hernia. Signed,  Criselda Peaches, MD  [facility]  [facility]   Electronically Signed  By: Jacqulynn Cadet M.D.  On: 10/06/2014 14:53   CARDIAC CATHETERIZATION REPORT  Frank [name] [id number] 1965-11-26  Performing Cardiologist: [name] Primary Physician: [name], MD Primary Cardiologist: [place]  Procedures Performed:  Left Heart Catheterization via 5 Fr right radial artery access  Native Coronary Angiography  Indication(s): pre-op for severe aortic stenosis  Pre-Procedural Diagnosis(es):  1. Bicuspid aortic valve 2. Severe aortic stenosis  Post-Procedural Diagnosis(es): 1. Same as above 2. No obstructive coronary disease  Pre-Procedural Non-invasive testing: none  History: 49 y.o. male presented with is a pleasant 49 yo male with a history of at least moderate AS (followed by Dr. [name] at the [facility]), presented for ACL repair with Dr. [name]. Preoperatively, he was  evaluated by Dr. [name] (cardiac anesthesia) and found to have a loud AS murmur- After some discussion, interoperative TEE was recommended and performed. I personally reviewed the TEE images which do demonstrate severe aortic stenosis, moderate AI and mild to moderate MR (obtained during surgery, on pressor, PPV, etc, but still significant). He was recently told he probably had another 5 years before needing to consider surgery. Mean gradient is already above 40 mmHg. Clinically, he has been asymptomatic (no chest pain, dyspnea, syncope, etc,) but recently he has had more palpitations (PVC's) and was started  on b-blocker. He did manage to get through surgery uneventfully.   He returns today in follow-up to the office. He continues to deny any chest pain, worsening shortness of breath, presyncope or syncopal episodes. He has not pushed himself significantly due to his recent knee surgery. He is undergoing rehabilitation and getting stronger. A repeat echocardiogram was just performed as an outpatient. This was initially read by one of my partners and indicated mild to moderate aortic stenosis based on a mean valve gradient in the 20s. However subsequent images during the study do indicate mean gradients greater than 50 from the right upper sternal border using Pedoff probe. I therefore personally reviewed the echocardiogram images and made the appropriate corrections, and the results are as follows:  Study Conclusions  - Left ventricle: The cavity size was normal. Wall thickness was increased in a pattern of mild LVH. Systolic function was normal. The estimated ejection fraction was in the range of 55% to 60%. - Aortic valve: The aortic valve is bicuspid and is heavily calcified with restricted leaflet motion. Peak and mean gradients through the valve are 100 and 59 mm Hg respectively, consistent with severe AS. The calculated AVA is 0.6 cm2. There is moderate, eccentric AI. There was mild  regurgitation. - Aorta: Aortic root dimension: 39 mm (ED). - Aortic root: The aortic root is mildly dilated. - Mitral valve: Calcified annulus. Mildly thickened leaflets . There was mild regurgitation. - Left atrium: LA Volume/BSA= 46.4 ml/m2. The atrium is moderately dilated.  Impressions:  - LVEF 60-65%, moderate concentric LVH, bicuspid and severely stenotic aortic valve- AVA 0.6 cm2, peak and mean gradients of 100 mmHg and 59 mmHg, respectively - LVOT diameter measured at 2.4 cm. There is moderate AI. There is mild to moderate MR with restricted anterior leaflet motion secondary to eccentric AI.  Pedoff measurements do indeed show a very high mean gradient of 59 and peak gradient of 100 mmHg. Based on a generous LVOT diameter of 2.4 cm, the calculated aortic valve gradient was 0.6 cm consistent with severe aortic stenosis.  Risks / Complications include, but not limited to: Death, MI, CVA/TIA, VF/VT (with defibrillation), Bradycardia (need for temporary pacer placement), contrast induced nephropathy, bleeding / bruising / hematoma / pseudoaneurysm, vascular or coronary injury (with possible emergent CT or Vascular Surgery), adverse medication reactions, infection.   Consent: Risks of procedure as well as the alternatives and risks of each were explained to the (patient/caregiver). Consent for procedure obtained.  Procedure: The patient was brought to the 2nd [facility] in the fasting state and prepped and draped in the usual sterile fashion for (Right radial) access. A modified Allen's test with plethysmography was performed on the right wrist demonstrating adequate Ulnar Artery collateral flow.   Time Out: Verified patient identification, verified procedure, site/side was marked, verified correct patient position, special equipment/implants available, radiation safety measures in place (including badges and shielding),  medications/allergies/relevent history reviewed, required imaging and test results available. Performed  Procedure: The right wrist was anesthetized with 1% subcutaneous Lidocaine. The right radial artery was accessed using the Seldinger Technique with placement of a 6 Fr Glide Sheath - this required 50 mcg of subcutaneous nitroglycerin due to radial spasm. The sheath was aspirated and flushed. Then a total of 10 ml of standard Radial Artery Cocktail (see medications) was infused. A 5 Fr TIG 4.0 Catheter was advanced of over a Safety J wire into the ascending Aorta. The catheter was not able to engage  the coronary arteries. Multiple catheter exchanges were made and eventually a 44F JL3.5 catheter was used to engage the left coronary artery system. Multiple cineangiographic views of the left coronary system were made by hand injection. This catheter was then exchanged for a 44F JR4 and eventually a 44F 3DRC catheter, which successfully engaged a small, non-dominant right coronary. Multiple cineangiographic views of the right coronary artery system(s) was performed. This catheter was then exchanged over the Long Exchange Safety J wire for an angled Pigtail catheter that was advanced across the Aortic Valve. LV hemodynamics were measured and the catheter was pulled back across the Aortic Valve for measurement of "pull-back" gradient. The catheter and the wire were removed completely out of the body.  The sheath was removed in the Cath Lab with a TR band placed at 15 ml Air at 1600 (time). Reverse Allen's test did not reveal non-occlusive hemostasis.  Recovery: The patient was transported to the cath lab holding area in stable condition.  The patient was noted to have a vagal reaction during the procedure. At which point, the catheterization was held and he was given a 250 cc saline bolus and 0.5 mg atropine. He was noted to be hypotensive and bradycardic, but that improved back to baseline by the end  of the case. There were not complications.  EBL: Minimal  Medications: Premedication: none Sedation: 2 mg IV Versed, 25 mcg IV Fentanyl Contrast: 50 ml Omnipaque Local Anesthesia: 3 cc 1% lidocaine 4000 U IV Heparin 15 cc radial cocktail 0.5 mg IV atropine 50 mcg Subcutaneous nitroglycerin  Hemodynamics: Central Aortic Pressure / Mean Aortic Pressure: 140/76 LV Pressure / LV End diastolic Pressure: N/A  Coronary Angiographic Data:  Left Main: Normal, bifurcates into the LAD and LCX systems.  Left Anterior Descending (LAD): Mild luminal irregularities  1st diagonal (D1): No stenosis.  Circumflex (LCx): Dominant. Bifurcates in a pitchfork fashion on the lateral wall  1st obtuse marginal: No stenosis.  posterior lateral branch: No stenosis.  Right Coronary Artery: Non-dominant vessel, small  posterior descending artery: no stenosis  posterior lateral branch: No stenosis  Impression: 1. No significant angiographic stenosis. 2. Heavily calcified aortic valve. 3. Dilated aortic root.  Plan: 1. Plan for aortic valve replacement per Dr. Cyndia Bent.  The case and results was discussed with the patient and family if available.  The case and results was not discussed with the patient's PCP. The case and results was discussed with the patient's Cardiologist.  Time Spent Directly with the Patient:  60 minutes  [name], MD, [facility] Attending Cardiologist [facility]  HILTY,Kenneth C 10/26/2014, 4:16 PM   Impression:  I have personally reviewed his echocardiograms.  He has severe, symptomatic aortic stenosis with chest pressure and shortness of breath occuring with mild exertion. He feels that something has clearly changed since he knee surgery. His mean gradient is 59 mm Hg. I have recommended that he proceed with AVR and  replacement of the aortic root and ascending aorta as soon as possible. His ascending aorta is 4.4 cm by CTA and with a bicuspid valve at his young age I think replacement is indicated to prevent progressive enlargement, aortic dissection, and the need for later replacement at a higher risk. I have recommended using a mechanical valve at his young age and he understands the need for lifelong anticoagulation with coumadin. He has no contraindication to anticoagulation and feels that he can be compliant with that. I discussed the operative procedure with the patient and  his wife including alternatives, benefits and risks; including but not limited to bleeding, blood transfusion, infection, stroke, myocardial infarction, graft failure, heart block requiring a permanent pacemaker, organ dysfunction, and death.  Frank [name] understands and agrees to proceed.    Plan: Bentall procedure using a mechanical valved graft on Monday 11/27/2014.

## 2014-11-27 ENCOUNTER — Inpatient Hospital Stay (HOSPITAL_COMMUNITY)
Admission: RE | Admit: 2014-11-27 | Discharge: 2014-12-02 | DRG: 220 | Disposition: A | Discharge status: Home / Self Care | Payer: 59 | Source: Ambulatory Visit | Attending: Surgery | Admitting: Surgery

## 2014-11-27 ENCOUNTER — Inpatient Hospital Stay (HOSPITAL_COMMUNITY): Payer: 59

## 2014-11-27 ENCOUNTER — Encounter (HOSPITAL_COMMUNITY)
Admission: RE | Disposition: A | Discharge status: Home / Self Care | Payer: 59 | Source: Ambulatory Visit | Attending: Surgery

## 2014-11-27 ENCOUNTER — Inpatient Hospital Stay (HOSPITAL_COMMUNITY): Payer: 59 | Admitting: Anesthesiology

## 2014-11-27 ENCOUNTER — Inpatient Hospital Stay (HOSPITAL_COMMUNITY): Payer: 59 | Admitting: Vascular Surgery

## 2014-11-27 DIAGNOSIS — Q231 Congenital insufficiency of aortic valve: Secondary | ICD-10-CM | POA: Diagnosis not present

## 2014-11-27 DIAGNOSIS — Z9689 Presence of other specified functional implants: Secondary | ICD-10-CM

## 2014-11-27 DIAGNOSIS — I350 Nonrheumatic aortic (valve) stenosis: Secondary | ICD-10-CM | POA: Diagnosis present

## 2014-11-27 DIAGNOSIS — E780 Pure hypercholesterolemia: Secondary | ICD-10-CM | POA: Diagnosis present

## 2014-11-27 DIAGNOSIS — K449 Diaphragmatic hernia without obstruction or gangrene: Secondary | ICD-10-CM | POA: Diagnosis present

## 2014-11-27 DIAGNOSIS — I2510 Atherosclerotic heart disease of native coronary artery without angina pectoris: Secondary | ICD-10-CM | POA: Diagnosis present

## 2014-11-27 DIAGNOSIS — I10 Essential (primary) hypertension: Secondary | ICD-10-CM | POA: Diagnosis present

## 2014-11-27 DIAGNOSIS — I712 Thoracic aortic aneurysm, without rupture: Secondary | ICD-10-CM | POA: Diagnosis present

## 2014-11-27 DIAGNOSIS — K219 Gastro-esophageal reflux disease without esophagitis: Secondary | ICD-10-CM | POA: Diagnosis present

## 2014-11-27 DIAGNOSIS — Z8249 Family history of ischemic heart disease and other diseases of the circulatory system: Secondary | ICD-10-CM | POA: Diagnosis not present

## 2014-11-27 DIAGNOSIS — Z95828 Presence of other vascular implants and grafts: Secondary | ICD-10-CM

## 2014-11-27 DIAGNOSIS — D62 Acute posthemorrhagic anemia: Secondary | ICD-10-CM | POA: Diagnosis not present

## 2014-11-27 DIAGNOSIS — I714 Abdominal aortic aneurysm, without rupture: Secondary | ICD-10-CM

## 2014-11-27 DIAGNOSIS — Z952 Presence of prosthetic heart valve: Secondary | ICD-10-CM

## 2014-11-27 DIAGNOSIS — I7121 Aneurysm of the ascending aorta, without rupture: Secondary | ICD-10-CM

## 2014-11-27 HISTORY — PX: BENTALL PROCEDURE: SHX5058

## 2014-11-27 HISTORY — PX: TEE WITHOUT CARDIOVERSION: SHX5443

## 2014-11-27 LAB — POCT I-STAT, CHEM 8
BUN: 17 mg/dL (ref 6–23)
BUN: 15 mg/dL (ref 6–23)
BUN: 15 mg/dL (ref 6–23)
BUN: 17 mg/dL (ref 6–23)
BUN: 18 mg/dL (ref 6–23)
BUN: 18 mg/dL (ref 6–23)
BUN: 17 mg/dL (ref 6–23)
BUN: 18 mg/dL (ref 6–23)
BUN: 15 mg/dL (ref 6–23)
CALCIUM ION: 1.1 mmol/L — AB (ref 1.12–1.23)
CHLORIDE: 101 mmol/L (ref 96–112)
CHLORIDE: 103 mmol/L (ref 96–112)
CHLORIDE: 104 mmol/L (ref 96–112)
CREATININE: 1 mg/dL (ref 0.50–1.35)
CREATININE: 1 mg/dL (ref 0.50–1.35)
CREATININE: 1 mg/dL (ref 0.50–1.35)
CREATININE: 1 mg/dL (ref 0.50–1.35)
Calcium, Ion: 1.28 mmol/L — ABNORMAL HIGH (ref 1.12–1.23)
Calcium, Ion: 1.12 mmol/L (ref 1.12–1.23)
Calcium, Ion: 1.22 mmol/L (ref 1.12–1.23)
Calcium, Ion: 1.15 mmol/L (ref 1.12–1.23)
Calcium, Ion: 1.14 mmol/L (ref 1.12–1.23)
Calcium, Ion: 1.11 mmol/L — ABNORMAL LOW (ref 1.12–1.23)
Calcium, Ion: 1.11 mmol/L — ABNORMAL LOW (ref 1.12–1.23)
Calcium, Ion: 1.19 mmol/L (ref 1.12–1.23)
Chloride: 102 mmol/L (ref 96–112)
Chloride: 106 mmol/L (ref 96–112)
Chloride: 103 mmol/L (ref 96–112)
Chloride: 103 mmol/L (ref 96–112)
Chloride: 103 mmol/L (ref 96–112)
Chloride: 107 mmol/L (ref 96–112)
Creatinine, Ser: 1.1 mg/dL (ref 0.50–1.35)
Creatinine, Ser: 1 mg/dL (ref 0.50–1.35)
Creatinine, Ser: 1 mg/dL (ref 0.50–1.35)
Creatinine, Ser: 1.2 mg/dL (ref 0.50–1.35)
Creatinine, Ser: 1.2 mg/dL (ref 0.50–1.35)
GLUCOSE: 101 mg/dL — AB (ref 70–99)
GLUCOSE: 98 mg/dL (ref 70–99)
GLUCOSE: 127 mg/dL — AB (ref 70–99)
Glucose, Bld: 108 mg/dL — ABNORMAL HIGH (ref 70–99)
Glucose, Bld: 121 mg/dL — ABNORMAL HIGH (ref 70–99)
Glucose, Bld: 111 mg/dL — ABNORMAL HIGH (ref 70–99)
Glucose, Bld: 119 mg/dL — ABNORMAL HIGH (ref 70–99)
Glucose, Bld: 128 mg/dL — ABNORMAL HIGH (ref 70–99)
Glucose, Bld: 84 mg/dL (ref 70–99)
HCT: 39 % (ref 39.0–52.0)
HCT: 33 % — ABNORMAL LOW (ref 39.0–52.0)
HCT: 28 % — ABNORMAL LOW (ref 39.0–52.0)
HCT: 26 % — ABNORMAL LOW (ref 39.0–52.0)
HCT: 26 % — ABNORMAL LOW (ref 39.0–52.0)
HCT: 28 % — ABNORMAL LOW (ref 39.0–52.0)
HEMATOCRIT: 28 % — AB (ref 39.0–52.0)
HEMATOCRIT: 28 % — AB (ref 39.0–52.0)
HEMATOCRIT: 26 % — AB (ref 39.0–52.0)
HEMOGLOBIN: 13.3 g/dL (ref 13.0–17.0)
HEMOGLOBIN: 9.5 g/dL — AB (ref 13.0–17.0)
HEMOGLOBIN: 8.8 g/dL — AB (ref 13.0–17.0)
HEMOGLOBIN: 8.8 g/dL — AB (ref 13.0–17.0)
Hemoglobin: 11.2 g/dL — ABNORMAL LOW (ref 13.0–17.0)
Hemoglobin: 9.5 g/dL — ABNORMAL LOW (ref 13.0–17.0)
Hemoglobin: 9.5 g/dL — ABNORMAL LOW (ref 13.0–17.0)
Hemoglobin: 8.8 g/dL — ABNORMAL LOW (ref 13.0–17.0)
Hemoglobin: 9.5 g/dL — ABNORMAL LOW (ref 13.0–17.0)
POTASSIUM: 4.2 mmol/L (ref 3.5–5.1)
POTASSIUM: 5.3 mmol/L — AB (ref 3.5–5.1)
POTASSIUM: 5.7 mmol/L — AB (ref 3.5–5.1)
Potassium: 4.3 mmol/L (ref 3.5–5.1)
Potassium: 4.5 mmol/L (ref 3.5–5.1)
Potassium: 5.4 mmol/L — ABNORMAL HIGH (ref 3.5–5.1)
Potassium: 4.9 mmol/L (ref 3.5–5.1)
Potassium: 5.9 mmol/L — ABNORMAL HIGH (ref 3.5–5.1)
Potassium: 4.3 mmol/L (ref 3.5–5.1)
SODIUM: 139 mmol/L (ref 135–145)
SODIUM: 141 mmol/L (ref 135–145)
Sodium: 137 mmol/L (ref 135–145)
Sodium: 138 mmol/L (ref 135–145)
Sodium: 136 mmol/L (ref 135–145)
Sodium: 134 mmol/L — ABNORMAL LOW (ref 135–145)
Sodium: 135 mmol/L (ref 135–145)
Sodium: 136 mmol/L (ref 135–145)
Sodium: 137 mmol/L (ref 135–145)
TCO2: 23 mmol/L (ref 0–100)
TCO2: 23 mmol/L (ref 0–100)
TCO2: 24 mmol/L (ref 0–100)
TCO2: 23 mmol/L (ref 0–100)
TCO2: 23 mmol/L (ref 0–100)
TCO2: 23 mmol/L (ref 0–100)
TCO2: 21 mmol/L (ref 0–100)
TCO2: 21 mmol/L (ref 0–100)
TCO2: 19 mmol/L (ref 0–100)

## 2014-11-27 LAB — CBC
HCT: 28.3 % — ABNORMAL LOW (ref 39.0–52.0)
HCT: 28 % — ABNORMAL LOW (ref 39.0–52.0)
Hemoglobin: 9.9 g/dL — ABNORMAL LOW (ref 13.0–17.0)
Hemoglobin: 9.8 g/dL — ABNORMAL LOW (ref 13.0–17.0)
MCH: 29.6 pg (ref 26.0–34.0)
MCH: 29.3 pg (ref 26.0–34.0)
MCHC: 35 g/dL (ref 30.0–36.0)
MCHC: 35 g/dL (ref 30.0–36.0)
MCV: 84.5 fL (ref 78.0–100.0)
MCV: 83.8 fL (ref 78.0–100.0)
PLATELETS: 92 10*3/uL — AB (ref 150–400)
Platelets: 109 10*3/uL — ABNORMAL LOW (ref 150–400)
RBC: 3.35 MIL/uL — ABNORMAL LOW (ref 4.22–5.81)
RBC: 3.34 MIL/uL — ABNORMAL LOW (ref 4.22–5.81)
RDW: 13.8 % (ref 11.5–15.5)
RDW: 13.9 % (ref 11.5–15.5)
WBC: 11.5 10*3/uL — AB (ref 4.0–10.5)
WBC: 14.3 10*3/uL — ABNORMAL HIGH (ref 4.0–10.5)

## 2014-11-27 LAB — POCT I-STAT 3, ART BLOOD GAS (G3+)
ACID-BASE DEFICIT: 2 mmol/L (ref 0.0–2.0)
ACID-BASE EXCESS: 1 mmol/L (ref 0.0–2.0)
Acid-Base Excess: 2 mmol/L (ref 0.0–2.0)
Acid-base deficit: 4 mmol/L — ABNORMAL HIGH (ref 0.0–2.0)
Acid-base deficit: 4 mmol/L — ABNORMAL HIGH (ref 0.0–2.0)
BICARBONATE: 21.9 meq/L (ref 20.0–24.0)
Bicarbonate: 28 mEq/L — ABNORMAL HIGH (ref 20.0–24.0)
Bicarbonate: 26.2 mEq/L — ABNORMAL HIGH (ref 20.0–24.0)
Bicarbonate: 23.2 mEq/L (ref 20.0–24.0)
Bicarbonate: 21.4 mEq/L (ref 20.0–24.0)
O2 SAT: 90 %
O2 SAT: 96 %
O2 Saturation: 100 %
O2 Saturation: 100 %
O2 Saturation: 100 %
PCO2 ART: 52 mmHg — AB (ref 35.0–45.0)
PCO2 ART: 38.1 mmHg (ref 35.0–45.0)
PH ART: 7.34 — AB (ref 7.350–7.450)
PH ART: 7.369 (ref 7.350–7.450)
PH ART: 7.354 (ref 7.350–7.450)
PO2 ART: 393 mmHg — AB (ref 80.0–100.0)
Patient temperature: 97
TCO2: 30 mmol/L (ref 0–100)
TCO2: 28 mmol/L (ref 0–100)
TCO2: 24 mmol/L (ref 0–100)
TCO2: 23 mmol/L (ref 0–100)
TCO2: 23 mmol/L (ref 0–100)
pCO2 arterial: 45.4 mmHg — ABNORMAL HIGH (ref 35.0–45.0)
pCO2 arterial: 39.3 mmHg (ref 35.0–45.0)
pCO2 arterial: 39 mmHg (ref 35.0–45.0)
pH, Arterial: 7.379 (ref 7.350–7.450)
pH, Arterial: 7.352 (ref 7.350–7.450)
pO2, Arterial: 439 mmHg — ABNORMAL HIGH (ref 80.0–100.0)
pO2, Arterial: 255 mmHg — ABNORMAL HIGH (ref 80.0–100.0)
pO2, Arterial: 58 mmHg — ABNORMAL LOW (ref 80.0–100.0)
pO2, Arterial: 80 mmHg (ref 80.0–100.0)

## 2014-11-27 LAB — GLUCOSE, CAPILLARY
Glucose-Capillary: 107 mg/dL — ABNORMAL HIGH (ref 70–99)
Glucose-Capillary: 91 mg/dL (ref 70–99)
Glucose-Capillary: 137 mg/dL — ABNORMAL HIGH (ref 70–99)

## 2014-11-27 LAB — MAGNESIUM: MAGNESIUM: 3.3 mg/dL — AB (ref 1.5–2.5)

## 2014-11-27 LAB — PROTIME-INR
INR: 1.39 (ref 0.00–1.49)
Prothrombin Time: 17.2 seconds — ABNORMAL HIGH (ref 11.6–15.2)

## 2014-11-27 LAB — CREATININE, SERUM
CREATININE: 1.36 mg/dL — AB (ref 0.50–1.35)
GFR calc Af Amer: 69 mL/min — ABNORMAL LOW (ref >90)
GFR, EST NON AFRICAN AMERICAN: 60 mL/min — AB (ref >90)

## 2014-11-27 LAB — PLATELET COUNT: Platelets: 105 10*3/uL — ABNORMAL LOW (ref 150–400)

## 2014-11-27 LAB — HEMOGLOBIN AND HEMATOCRIT, BLOOD
HEMATOCRIT: 26.1 % — AB (ref 39.0–52.0)
Hemoglobin: 9.2 g/dL — ABNORMAL LOW (ref 13.0–17.0)

## 2014-11-27 LAB — APTT: APTT: 37 s (ref 24–37)

## 2014-11-27 SURGERY — BENTALL PROCEDURE
Anesthesia: General | Site: Chest

## 2014-11-27 MED ORDER — HEPARIN SODIUM (PORCINE) 1000 UNIT/ML IJ SOLN
INTRAMUSCULAR | Status: AC
  Filled 2014-11-27: qty 1

## 2014-11-27 MED ORDER — FENTANYL CITRATE 0.05 MG/ML IJ SOLN
INTRAMUSCULAR | Status: AC
  Filled 2014-11-27: qty 5

## 2014-11-27 MED ORDER — SODIUM CHLORIDE 0.9 % IJ SOLN: 3.0000 mL | INTRAMUSCULAR | Status: DC | PRN

## 2014-11-27 MED ORDER — POTASSIUM CHLORIDE 10 MEQ/50ML IV SOLN: 10.0000 meq | INTRAVENOUS | Status: AC

## 2014-11-27 MED ORDER — PROPOFOL 10 MG/ML IV BOLUS
INTRAVENOUS | Status: AC
  Filled 2014-11-27: qty 20

## 2014-11-27 MED ORDER — ROCURONIUM BROMIDE 100 MG/10ML IV SOLN
INTRAVENOUS | Status: DC | PRN
  Administered 2014-11-27: 100 mg via INTRAVENOUS

## 2014-11-27 MED ORDER — INSULIN REGULAR BOLUS VIA INFUSION
0.0000 [IU] | Freq: Three times a day (TID) | INTRAVENOUS | Status: DC
  Filled 2014-11-27: qty 10

## 2014-11-27 MED ORDER — ACETAMINOPHEN 500 MG PO TABS
1000.0000 mg | ORAL_TABLET | Freq: Four times a day (QID) | ORAL | Status: DC
  Administered 2014-11-28 – 2014-11-29 (×5): 1000 mg via ORAL
  Filled 2014-11-27 (×12): qty 2

## 2014-11-27 MED ORDER — ROCURONIUM BROMIDE 50 MG/5ML IV SOLN
INTRAVENOUS | Status: AC
  Filled 2014-11-27: qty 1

## 2014-11-27 MED ORDER — MIDAZOLAM HCL 2 MG/2ML IJ SOLN
INTRAMUSCULAR | Status: AC
  Filled 2014-11-27: qty 2

## 2014-11-27 MED ORDER — METOCLOPRAMIDE HCL 5 MG/ML IJ SOLN
10.0000 mg | Freq: Four times a day (QID) | INTRAMUSCULAR | Status: AC
  Administered 2014-11-27 – 2014-11-28 (×3): 10 mg via INTRAVENOUS
  Filled 2014-11-27: qty 2

## 2014-11-27 MED ORDER — SODIUM CHLORIDE 0.9 % IV SOLN
0.5000 g/h | Freq: Once | INTRAVENOUS | Status: DC
  Filled 2014-11-27: qty 20

## 2014-11-27 MED ORDER — FAMOTIDINE IN NACL 20-0.9 MG/50ML-% IV SOLN
20.0000 mg | Freq: Two times a day (BID) | INTRAVENOUS | Status: AC
  Administered 2014-11-27: 20 mg via INTRAVENOUS

## 2014-11-27 MED ORDER — THROMBIN 20000 UNITS EX SOLR
CUTANEOUS | Status: DC | PRN
  Administered 2014-11-27 (×3): via TOPICAL

## 2014-11-27 MED ORDER — VITAMIN C 500 MG PO TABS
1000.0000 mg | ORAL_TABLET | Freq: Every day | ORAL | Status: DC
  Administered 2014-11-30: 1000 mg via ORAL
  Filled 2014-11-27 (×3): qty 2

## 2014-11-27 MED ORDER — SODIUM CHLORIDE 0.45 % IV SOLN
INTRAVENOUS | Status: DC | PRN
  Administered 2014-11-27: 15:00:00 via INTRAVENOUS

## 2014-11-27 MED ORDER — METOPROLOL TARTRATE 25 MG/10 ML ORAL SUSPENSION
12.5000 mg | Freq: Two times a day (BID) | ORAL | Status: DC
  Filled 2014-11-27 (×3): qty 5

## 2014-11-27 MED ORDER — PROTAMINE SULFATE 10 MG/ML IV SOLN
INTRAVENOUS | Status: DC | PRN
Start: 2014-11-27 — End: 2014-11-27
  Administered 2014-11-27: 300 mg via INTRAVENOUS

## 2014-11-27 MED ORDER — ACETAMINOPHEN 160 MG/5ML PO SOLN: 650.0000 mg | Freq: Once | ORAL | Status: AC

## 2014-11-27 MED ORDER — THROMBIN 20000 UNITS EX SOLR
CUTANEOUS | Status: AC
  Filled 2014-11-27: qty 20000

## 2014-11-27 MED ORDER — METOPROLOL TARTRATE 1 MG/ML IV SOLN: 2.5000 mg | INTRAVENOUS | Status: DC | PRN

## 2014-11-27 MED ORDER — ASPIRIN EC 325 MG PO TBEC
325.0000 mg | DELAYED_RELEASE_TABLET | Freq: Every day | ORAL | Status: DC
  Filled 2014-11-27: qty 1

## 2014-11-27 MED ORDER — VECURONIUM BROMIDE 10 MG IV SOLR
INTRAVENOUS | Status: DC | PRN
  Administered 2014-11-27 (×3): 5 mg via INTRAVENOUS

## 2014-11-27 MED ORDER — PHENYLEPHRINE HCL 10 MG/ML IJ SOLN
0.0000 ug/min | INTRAMUSCULAR | Status: DC
  Administered 2014-11-27: 20 ug/min via INTRAVENOUS
  Administered 2014-11-28: 40 ug/min via INTRAVENOUS
  Filled 2014-11-27 (×2): qty 2

## 2014-11-27 MED ORDER — HEMOSTATIC AGENTS (NO CHARGE) OPTIME
TOPICAL | Status: DC | PRN
  Administered 2014-11-27: 1 via TOPICAL

## 2014-11-27 MED ORDER — THROMBIN 20000 UNITS EX SOLR
CUTANEOUS | Status: DC | PRN
  Administered 2014-11-27: 20000 [IU] via TOPICAL

## 2014-11-27 MED ORDER — MIDAZOLAM HCL 2 MG/2ML IJ SOLN: 2.0000 mg | INTRAMUSCULAR | Status: DC | PRN

## 2014-11-27 MED ORDER — 0.9 % SODIUM CHLORIDE (POUR BTL) OPTIME
TOPICAL | Status: DC | PRN
  Administered 2014-11-27: 6000 mL

## 2014-11-27 MED ORDER — ALBUMIN HUMAN 5 % IV SOLN
INTRAVENOUS | Status: DC | PRN
  Administered 2014-11-27 (×2): via INTRAVENOUS

## 2014-11-27 MED ORDER — TRAMADOL HCL 50 MG PO TABS
50.0000 mg | ORAL_TABLET | ORAL | Status: DC | PRN
  Administered 2014-11-30: 100 mg via ORAL
  Filled 2014-11-27: qty 2

## 2014-11-27 MED ORDER — NITROGLYCERIN IN D5W 200-5 MCG/ML-% IV SOLN: 0.0000 ug/min | INTRAVENOUS | Status: DC

## 2014-11-27 MED ORDER — ALBUMIN HUMAN 5 % IV SOLN: 250.0000 mL | INTRAVENOUS | Status: AC | PRN

## 2014-11-27 MED ORDER — SODIUM CHLORIDE 0.9 % IV SOLN
INTRAVENOUS | Status: DC
  Filled 2014-11-27 (×2): qty 2.5

## 2014-11-27 MED ORDER — CHLORHEXIDINE GLUCONATE 4 % EX LIQD
30.0000 mL | CUTANEOUS | Status: DC
  Filled 2014-11-27: qty 30

## 2014-11-27 MED ORDER — VANCOMYCIN HCL IN DEXTROSE 1-5 GM/200ML-% IV SOLN
1000.0000 mg | Freq: Once | INTRAVENOUS | Status: AC
  Administered 2014-11-27: 1000 mg via INTRAVENOUS
  Filled 2014-11-27: qty 200

## 2014-11-27 MED ORDER — METHYLPREDNISOLONE SODIUM SUCC 125 MG IJ SOLR
INTRAMUSCULAR | Status: AC
  Filled 2014-11-27: qty 2

## 2014-11-27 MED ORDER — SODIUM CHLORIDE 0.9 % IV SOLN: 250.0000 mL | INTRAVENOUS | Status: DC

## 2014-11-27 MED ORDER — VECURONIUM BROMIDE 10 MG IV SOLR
INTRAVENOUS | Status: AC
  Filled 2014-11-27: qty 10

## 2014-11-27 MED ORDER — HEPARIN SODIUM (PORCINE) 1000 UNIT/ML IJ SOLN
INTRAMUSCULAR | Status: DC | PRN
  Administered 2014-11-27: 40000 [IU] via INTRAVENOUS

## 2014-11-27 MED ORDER — BISACODYL 5 MG PO TBEC
10.0000 mg | DELAYED_RELEASE_TABLET | Freq: Every day | ORAL | Status: DC
  Administered 2014-11-28 – 2014-11-29 (×2): 10 mg via ORAL
  Filled 2014-11-27 (×2): qty 2

## 2014-11-27 MED ORDER — MORPHINE SULFATE 2 MG/ML IJ SOLN
1.0000 mg | INTRAMUSCULAR | Status: AC | PRN
  Administered 2014-11-27 (×2): 2 mg via INTRAVENOUS
  Administered 2014-11-27: 4 mg via INTRAVENOUS
  Filled 2014-11-27: qty 1

## 2014-11-27 MED ORDER — PANTOPRAZOLE SODIUM 40 MG PO TBEC
80.0000 mg | DELAYED_RELEASE_TABLET | Freq: Every day | ORAL | Status: DC
  Administered 2014-11-28 – 2014-12-02 (×5): 80 mg via ORAL
  Filled 2014-11-27 (×5): qty 2

## 2014-11-27 MED ORDER — SODIUM CHLORIDE 0.9 % IJ SOLN
3.0000 mL | Freq: Two times a day (BID) | INTRAMUSCULAR | Status: DC
  Administered 2014-11-28 – 2014-11-30 (×4): 3 mL via INTRAVENOUS

## 2014-11-27 MED ORDER — SODIUM CHLORIDE 0.9 % IV SOLN
INTRAVENOUS | Status: DC
  Administered 2014-11-27: 15:00:00 via INTRAVENOUS

## 2014-11-27 MED ORDER — PHENYLEPHRINE 40 MCG/ML (10ML) SYRINGE FOR IV PUSH (FOR BLOOD PRESSURE SUPPORT)
PREFILLED_SYRINGE | INTRAVENOUS | Status: AC
  Filled 2014-11-27: qty 10

## 2014-11-27 MED ORDER — LACTATED RINGERS IV SOLN
INTRAVENOUS | Status: DC | PRN
  Administered 2014-11-27 (×6): via INTRAVENOUS

## 2014-11-27 MED ORDER — SODIUM CHLORIDE 0.9 % IJ SOLN
INTRAMUSCULAR | Status: AC
  Filled 2014-11-27: qty 10

## 2014-11-27 MED ORDER — ASPIRIN 81 MG PO CHEW: 324.0000 mg | CHEWABLE_TABLET | Freq: Every day | ORAL | Status: DC

## 2014-11-27 MED ORDER — EPHEDRINE SULFATE 50 MG/ML IJ SOLN
INTRAMUSCULAR | Status: AC
  Filled 2014-11-27: qty 1

## 2014-11-27 MED ORDER — MAGNESIUM SULFATE 4 GM/100ML IV SOLN
4.0000 g | Freq: Once | INTRAVENOUS | Status: AC
  Administered 2014-11-27: 4 g via INTRAVENOUS
  Filled 2014-11-27: qty 100

## 2014-11-27 MED ORDER — LACTATED RINGERS IV SOLN: INTRAVENOUS | Status: DC

## 2014-11-27 MED ORDER — PROTAMINE SULFATE 10 MG/ML IV SOLN
INTRAVENOUS | Status: AC
  Filled 2014-11-27: qty 25

## 2014-11-27 MED ORDER — METOPROLOL TARTRATE 12.5 MG HALF TABLET
12.5000 mg | ORAL_TABLET | Freq: Two times a day (BID) | ORAL | Status: DC
  Filled 2014-11-27 (×3): qty 1

## 2014-11-27 MED ORDER — LIDOCAINE HCL (CARDIAC) 20 MG/ML IV SOLN
INTRAVENOUS | Status: AC
  Filled 2014-11-27: qty 5

## 2014-11-27 MED ORDER — DEXMEDETOMIDINE HCL IN NACL 200 MCG/50ML IV SOLN
0.0000 ug/kg/h | INTRAVENOUS | Status: DC
  Administered 2014-11-27: 0.5 ug/kg/h via INTRAVENOUS
  Filled 2014-11-27 (×2): qty 50

## 2014-11-27 MED ORDER — MORPHINE SULFATE 2 MG/ML IJ SOLN
2.0000 mg | INTRAMUSCULAR | Status: DC | PRN
  Administered 2014-11-27 – 2014-11-30 (×4): 4 mg via INTRAVENOUS
  Filled 2014-11-27: qty 2
  Filled 2014-11-27: qty 1
  Filled 2014-11-27 (×4): qty 2

## 2014-11-27 MED ORDER — MIDAZOLAM HCL 5 MG/5ML IJ SOLN
INTRAMUSCULAR | Status: DC | PRN
  Administered 2014-11-27 (×2): 2 mg via INTRAVENOUS
  Administered 2014-11-27: 1 mg via INTRAVENOUS
  Administered 2014-11-27: 4 mg via INTRAVENOUS
  Administered 2014-11-27 (×3): 1 mg via INTRAVENOUS

## 2014-11-27 MED ORDER — OXYCODONE HCL 5 MG PO TABS
5.0000 mg | ORAL_TABLET | ORAL | Status: DC | PRN
  Administered 2014-11-28 – 2014-11-30 (×14): 10 mg via ORAL
  Filled 2014-11-27 (×14): qty 2

## 2014-11-27 MED ORDER — CHLORHEXIDINE GLUCONATE 0.12 % MT SOLN
15.0000 mL | Freq: Two times a day (BID) | OROMUCOSAL | Status: DC
  Administered 2014-11-27 – 2014-11-29 (×3): 15 mL via OROMUCOSAL
  Filled 2014-11-27 (×3): qty 15

## 2014-11-27 MED ORDER — ONDANSETRON HCL 4 MG/2ML IJ SOLN
4.0000 mg | Freq: Four times a day (QID) | INTRAMUSCULAR | Status: DC | PRN
  Administered 2014-11-27: 4 mg via INTRAVENOUS
  Filled 2014-11-27: qty 2

## 2014-11-27 MED ORDER — DEXTROSE 5 % IV SOLN
1.5000 g | Freq: Two times a day (BID) | INTRAVENOUS | Status: AC
  Administered 2014-11-27 – 2014-11-29 (×4): 1.5 g via INTRAVENOUS
  Filled 2014-11-27 (×4): qty 1.5

## 2014-11-27 MED ORDER — ACETAMINOPHEN 650 MG RE SUPP
650.0000 mg | Freq: Once | RECTAL | Status: AC
  Administered 2014-11-27: 650 mg via RECTAL

## 2014-11-27 MED ORDER — PROTAMINE SULFATE 10 MG/ML IV SOLN
INTRAVENOUS | Status: AC
  Filled 2014-11-27: qty 5

## 2014-11-27 MED ORDER — LIDOCAINE HCL (CARDIAC) 20 MG/ML IV SOLN
INTRAVENOUS | Status: DC | PRN
  Administered 2014-11-27: 100 mg via INTRAVENOUS

## 2014-11-27 MED ORDER — CETYLPYRIDINIUM CHLORIDE 0.05 % MT LIQD
7.0000 mL | Freq: Four times a day (QID) | OROMUCOSAL | Status: DC
  Administered 2014-11-28 – 2014-11-29 (×7): 7 mL via OROMUCOSAL

## 2014-11-27 MED ORDER — SODIUM BICARBONATE 8.4 % IV SOLN
50.0000 meq | Freq: Once | INTRAVENOUS | Status: AC
  Administered 2014-11-27: 50 meq via INTRAVENOUS

## 2014-11-27 MED ORDER — FENTANYL CITRATE 0.05 MG/ML IJ SOLN
INTRAMUSCULAR | Status: DC | PRN
  Administered 2014-11-27 (×2): 50 ug via INTRAVENOUS
  Administered 2014-11-27: 100 ug via INTRAVENOUS
  Administered 2014-11-27: 50 ug via INTRAVENOUS
  Administered 2014-11-27: 150 ug via INTRAVENOUS
  Administered 2014-11-27 (×3): 100 ug via INTRAVENOUS
  Administered 2014-11-27 (×3): 50 ug via INTRAVENOUS
  Administered 2014-11-27: 150 ug via INTRAVENOUS
  Administered 2014-11-27: 100 ug via INTRAVENOUS
  Administered 2014-11-27: 50 ug via INTRAVENOUS
  Administered 2014-11-27: 100 ug via INTRAVENOUS
  Administered 2014-11-27: 50 ug via INTRAVENOUS
  Administered 2014-11-27: 100 ug via INTRAVENOUS
  Administered 2014-11-27 (×2): 50 ug via INTRAVENOUS

## 2014-11-27 MED ORDER — PANTOPRAZOLE SODIUM 40 MG PO TBEC: 40.0000 mg | DELAYED_RELEASE_TABLET | Freq: Every day | ORAL | Status: DC

## 2014-11-27 MED ORDER — ALPRAZOLAM 0.5 MG PO TABS
0.5000 mg | ORAL_TABLET | Freq: Every evening | ORAL | Status: DC | PRN
  Administered 2014-11-29 – 2014-12-01 (×3): 0.5 mg via ORAL
  Filled 2014-11-27 (×3): qty 1

## 2014-11-27 MED ORDER — VECURONIUM BROMIDE 10 MG IV SOLR
INTRAVENOUS | Status: AC
  Filled 2014-11-27: qty 20

## 2014-11-27 MED ORDER — METHYLPREDNISOLONE SODIUM SUCC 125 MG IJ SOLR
INTRAMUSCULAR | Status: DC | PRN
  Administered 2014-11-27: 125 mg via INTRAVENOUS

## 2014-11-27 MED ORDER — DOCUSATE SODIUM 100 MG PO CAPS
200.0000 mg | ORAL_CAPSULE | Freq: Every day | ORAL | Status: DC
  Administered 2014-11-28 – 2014-11-30 (×3): 200 mg via ORAL
  Filled 2014-11-27 (×3): qty 2

## 2014-11-27 MED ORDER — MIDAZOLAM HCL 10 MG/2ML IJ SOLN
INTRAMUSCULAR | Status: AC
  Filled 2014-11-27: qty 2

## 2014-11-27 MED ORDER — ACETAMINOPHEN 160 MG/5ML PO SOLN: 1000.0000 mg | Freq: Four times a day (QID) | ORAL | Status: DC

## 2014-11-27 MED ORDER — PROTAMINE SULFATE 10 MG/ML IV SOLN: INTRAVENOUS | Status: DC | PRN

## 2014-11-27 MED ORDER — PROPOFOL 10 MG/ML IV BOLUS
INTRAVENOUS | Status: DC | PRN
  Administered 2014-11-27 (×2): 50 mg via INTRAVENOUS
  Administered 2014-11-27: 80 mg via INTRAVENOUS
  Administered 2014-11-27 (×3): 50 mg via INTRAVENOUS

## 2014-11-27 MED ORDER — BISACODYL 10 MG RE SUPP: 10.0000 mg | Freq: Every day | RECTAL | Status: DC

## 2014-11-27 MED ORDER — LACTATED RINGERS IV SOLN: 500.0000 mL | Freq: Once | INTRAVENOUS | Status: AC | PRN

## 2014-11-27 SURGICAL SUPPLY — 89 items
ADAPTER CARDIO PERF ANTE/RETRO (ADAPTER) ×3 IMPLANT
APPLICATOR TIP COSEAL (VASCULAR PRODUCTS) ×12 IMPLANT
ATTRACTOMAT 16X20 MAGNETIC DRP (DRAPES) ×3 IMPLANT
BAG DECANTER FOR FLEXI CONT (MISCELLANEOUS) ×3 IMPLANT
BLADE STERNUM SYSTEM 6 (BLADE) ×3 IMPLANT
BLADE SURG 15 STRL LF DISP TIS (BLADE) ×2 IMPLANT
BLADE SURG 15 STRL SS (BLADE) ×1
CANISTER SUCTION 2500CC (MISCELLANEOUS) ×3 IMPLANT
CANNULA GUNDRY RCSP 15FR (MISCELLANEOUS) ×6 IMPLANT
CATH HEART VENT LEFT (CATHETERS) ×2 IMPLANT
CATH ROBINSON RED A/P 18FR (CATHETERS) ×12 IMPLANT
CATH THORACIC 36FR (CATHETERS) ×3 IMPLANT
CATH THORACIC 36FR RT ANG (CATHETERS) ×3 IMPLANT
CAUTERY HIGH TEMP VAS (MISCELLANEOUS) ×3 IMPLANT
CAUTERY SURG HI TEMP FINE TIP (MISCELLANEOUS) ×3 IMPLANT
CONT SPEC 4OZ CLIKSEAL STRL BL (MISCELLANEOUS) ×6 IMPLANT
CONT SPEC STER OR (MISCELLANEOUS) ×3 IMPLANT
COVER SURGICAL LIGHT HANDLE (MISCELLANEOUS) ×6 IMPLANT
CRADLE DONUT ADULT HEAD (MISCELLANEOUS) ×3 IMPLANT
DRAPE SLUSH/WARMER DISC (DRAPES) IMPLANT
DRSG COVADERM 4X14 (GAUZE/BANDAGES/DRESSINGS) ×3 IMPLANT
ELECT CAUTERY BLADE 6.4 (BLADE) ×3 IMPLANT
ELECT REM PT RETURN 9FT ADLT (ELECTROSURGICAL) ×6
ELECTRODE REM PT RTRN 9FT ADLT (ELECTROSURGICAL) ×4 IMPLANT
GAUZE SPONGE 4X4 12PLY STRL (GAUZE/BANDAGES/DRESSINGS) ×3 IMPLANT
GLOVE BIO SURGEON STRL SZ 6 (GLOVE) IMPLANT
GLOVE BIO SURGEON STRL SZ 6.5 (GLOVE) ×12 IMPLANT
GLOVE BIO SURGEON STRL SZ7 (GLOVE) IMPLANT
GLOVE BIO SURGEON STRL SZ7.5 (GLOVE) IMPLANT
GLOVE BIOGEL PI IND STRL 6.5 (GLOVE) ×6 IMPLANT
GLOVE BIOGEL PI INDICATOR 6.5 (GLOVE) ×3
GLOVE EUDERMIC 7 POWDERFREE (GLOVE) ×6 IMPLANT
GOWN STRL REUS W/ TWL LRG LVL3 (GOWN DISPOSABLE) ×12 IMPLANT
GOWN STRL REUS W/ TWL XL LVL3 (GOWN DISPOSABLE) ×2 IMPLANT
GOWN STRL REUS W/TWL LRG LVL3 (GOWN DISPOSABLE) ×6
GOWN STRL REUS W/TWL XL LVL3 (GOWN DISPOSABLE) ×1
GRAFT HEMASHIELD 28X40 (Vascular Products) ×3 IMPLANT
HEART VENT LT CURVED (MISCELLANEOUS) ×3 IMPLANT
HEMOSTAT POWDER SURGIFOAM 1G (HEMOSTASIS) ×9 IMPLANT
HEMOSTAT SURGICEL 2X14 (HEMOSTASIS) ×3 IMPLANT
KIT BASIN OR (CUSTOM PROCEDURE TRAY) ×3 IMPLANT
KIT CATH CPB BARTLE (MISCELLANEOUS) ×3 IMPLANT
KIT ROOM TURNOVER OR (KITS) ×3 IMPLANT
KIT SUCTION CATH 14FR (SUCTIONS) ×3 IMPLANT
LINE VENT (MISCELLANEOUS) ×3 IMPLANT
LOOP VESSEL SUPERMAXI WHITE (MISCELLANEOUS) ×3 IMPLANT
NS IRRIG 1000ML POUR BTL (IV SOLUTION) ×18 IMPLANT
PACK OPEN HEART (CUSTOM PROCEDURE TRAY) ×3 IMPLANT
PAD ARMBOARD 7.5X6 YLW CONV (MISCELLANEOUS) ×6 IMPLANT
SEALANT SURG COSEAL 8ML (VASCULAR PRODUCTS) ×3 IMPLANT
SET CARDIOPLEGIA MPS 5001102 (MISCELLANEOUS) ×3 IMPLANT
SPONGE GAUZE 4X4 12PLY STER LF (GAUZE/BANDAGES/DRESSINGS) ×3 IMPLANT
SUT BONE WAX W31G (SUTURE) ×3 IMPLANT
SUT ETHIBON 2 0 V 52N 30 (SUTURE) ×12 IMPLANT
SUT ETHIBON EXCEL 2-0 V-5 (SUTURE) IMPLANT
SUT ETHIBOND V-5 VALVE (SUTURE) IMPLANT
SUT PROLENE 3 0 SH 1 (SUTURE) ×3 IMPLANT
SUT PROLENE 3 0 SH 48 (SUTURE) ×9 IMPLANT
SUT PROLENE 3 0 SH DA (SUTURE) IMPLANT
SUT PROLENE 3 0 SH1 36 (SUTURE) ×3 IMPLANT
SUT PROLENE 4 0 RB 1 (SUTURE) ×4
SUT PROLENE 4-0 RB1 .5 CRCL 36 (SUTURE) ×8 IMPLANT
SUT PROLENE 5 0 C 1 36 (SUTURE) ×6 IMPLANT
SUT PROLENE 5 0 RB 2 (SUTURE) ×6 IMPLANT
SUT SILK 2 0 SH CR/8 (SUTURE) ×3 IMPLANT
SUT STEEL 6MS V (SUTURE) ×6 IMPLANT
SUT STEEL STERNAL CCS#1 18IN (SUTURE) IMPLANT
SUT STEEL SZ 6 DBL 3X14 BALL (SUTURE) IMPLANT
SUT VIC AB 1 CTX 36 (SUTURE) ×2
SUT VIC AB 1 CTX36XBRD ANBCTR (SUTURE) ×4 IMPLANT
SUT VIC AB 2-0 CT1 27 (SUTURE)
SUT VIC AB 2-0 CT1 TAPERPNT 27 (SUTURE) IMPLANT
SUT VIC AB 3-0 X1 27 (SUTURE) IMPLANT
SUTURE E-PAK OPEN HEART (SUTURE) ×3 IMPLANT
SWAB COLLECTION DEVICE MRSA (MISCELLANEOUS) ×6 IMPLANT
SYSTEM SAHARA CHEST DRAIN ATS (WOUND CARE) ×3 IMPLANT
TAPE CLOTH SURG 4X10 WHT LF (GAUZE/BANDAGES/DRESSINGS) ×3 IMPLANT
TAPE PAPER 2X10 WHT MICROPORE (GAUZE/BANDAGES/DRESSINGS) ×3 IMPLANT
TOWEL OR 17X24 6PK STRL BLUE (TOWEL DISPOSABLE) ×3 IMPLANT
TOWEL OR 17X26 10 PK STRL BLUE (TOWEL DISPOSABLE) ×3 IMPLANT
TRAY FOLEY IC TEMP SENS 14FR (CATHETERS) ×3 IMPLANT
TUBE ANAEROBIC SPECIMEN COL (MISCELLANEOUS) ×3 IMPLANT
TUBE CONNECTING 12X1/4 (SUCTIONS) ×3 IMPLANT
UNDERPAD 30X30 INCONTINENT (UNDERPADS AND DIAPERS) ×3 IMPLANT
VALVE AOR 12X21MECH LO POR (Prosthesis & Implant Heart) ×2 IMPLANT
VALVE AORTIC COND (Prosthesis & Implant Heart) ×1 IMPLANT
VENT LEFT HEART 12002 (CATHETERS) ×3
WATER STERILE IRR 1000ML POUR (IV SOLUTION) ×6 IMPLANT
YANKAUER SUCT BULB TIP NO VENT (SUCTIONS) ×3 IMPLANT

## 2014-11-27 NOTE — OR Nursing (Signed)
13:35 - 1st call to SICU, 14:05 - 2nd call to SiCU

## 2014-11-27 NOTE — Progress Notes (Signed)
  Echocardiogram Echocardiogram Transesophageal has been performed.  Donata Clay 11/27/2014, 8:30 AM

## 2014-11-27 NOTE — Brief Op Note (Signed)
11/27/2014  12:52 PM  PATIENT:  Frank [name]  49 y.o. 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 Jude Mechanical Aortic Valved Conduit  TRANSESOPHAGEAL ECHOCARDIOGRAM (TEE) (N/A)  SURGEON:  Surgeon(s) and Role:    * [name], MD - Primary  PHYSICIAN ASSISTANT: Ellwood Handler PA-C  ANESTHESIA:   general  EBL:  Total I/O In: -  Out: 700 [Urine:700]  BLOOD ADMINISTERED: CELLSAVER  DRAINS: Mediastinal Chest Drains   LOCAL MEDICATIONS USED:  NONE  SPECIMEN:  Source of Specimen:  1. Ascending Aorta   2. Bicuspid Aortic Valve leaflets 3. Liquid Calcium Collection  DISPOSITION OF SPECIMEN:  Pathology, Microbiology  COUNTS:  YES  TOURNIQUET:  * No tourniquets in log *  DICTATION: .Dragon Dictation  PLAN OF CARE: Admit to inpatient   PATIENT DISPOSITION:  ICU - intubated and hemodynamically stable.   Delay start of Pharmacological VTE agent (>24hrs) due to surgical blood loss or risk of bleeding: yes  Aortic Valve Etiology   Aortic Insufficiency:  Moderate  Aortic Valve Disease:  Yes.  Aortic Stenosis:  Yes. Smallest Aortic Valve Area: 0.59cm2; Highest Mean Gradient: 68mmHg.  Etiology (Choose at least one and up to  5 etiologies):  Bicuspid valve disease and Degenerative - Calcified   Aortic Valve  Procedure Performed:  Replacement: Yes.  Mechanical Valve. Implant Model Number:[id number], Size:21, Unique Device Identifier:[id number]  Repair/Reconstruction: Yes.  Root replacement with valved conduit (Bentall) Mechanical Valve. Implant Model Number:[id number], Size:21, Unique Device Identifier:[id number]  Aortic Annular Enlargement: No.

## 2014-11-27 NOTE — Interval H&P Note (Signed)
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 After consideration of risks, benefits and other options for treatment, the patient has consented to  Procedure(s) with comments: BENTALL PROCEDURE (N/A) - WITH CIRC ARREST TRANSESOPHAGEAL ECHOCARDIOGRAM (TEE) (N/A) as a surgical intervention .  The patient's history has been reviewed, patient examined, no change in status, stable for surgery.  I have reviewed the patient's chart and labs.  Questions were answered to the patient's satisfaction.     [name]

## 2014-11-27 NOTE — Op Note (Signed)
CARDIOVASCULAR SURGERY OPERATIVE NOTE  11/27/2014   Surgeon:  [name], MD  First Assistant: Ellwood Handler,  PA-C   Preoperative Diagnosis:  Severe bicuspid aortic valve stenosis and ascending aortic aneurysm.   Postoperative Diagnosis:  Same   Procedure:  1. Median Sternotomy 2. Extracorporeal circulation 3.   Replacement of ascending aortic aneurysm (hemi-arch replacement) using a 28 mm Hemashield graft under deep hypothermic circulatory arrest. 4.   Bentall Procedure ( replacement of aortic valve and aortic root with reimplantation of the coronary arteries)  using a 21 mm St. Jude mechanical valved graft.  Anesthesia:  General Endotracheal   Clinical History/Surgical Indication:  The patient is a 49 year old gentleman with known bicuspid aortic valve with a history of at least moderate AS that had been followed by Dr. [name] at [facility]. He underwent ACL repair by Dr. [name] on 08/01/2014 and was noted to have a loud AS murmur by anesthesia. Intraop TEE showed severe AS, moderate AI and mild to moderate MR. He got through surgery uneventfully and has been recovering nicely. He had a 2D echo on 09/04/2014 which shows severe AS with a mean gradient of 59 mm Hg and a peak of 100 mm Hg. There aortic valve is bicuspid and heavily calcified with restricted leaflet motion. AVA was 0.6 cm2. There was mild MR and mild aortic root dilatation. The LVEF was 55-60% with mild LVH. He says that he was not having any symptoms prior to his knee surgery but now notes exertional fatigue, chest pressure and shortness of breath. His wife notes that he sleeps a lot. CTA of the chest on 10/06/2014 shows fusiform aneurysmal enlargement of the ascending aorta to 4.4 cm. Cardiac cath on 10/26/2014 shows no significant coronary disease with a dilated aortic root.  He has severe, symptomatic aortic stenosis with chest pressure and shortness of breath occuring with mild exertion. He feels that something  has clearly changed since he knee surgery. His mean gradient is 59 mm Hg. I have recommended that he proceed with AVR and replacement of the aortic root and ascending aorta as soon as possible. His ascending aorta is 4.4 cm by CTA and with a bicuspid valve at his young age I think replacement is indicated to prevent progressive enlargement, aortic dissection, and the need for later replacement at a higher risk. I have recommended using a mechanical valve at his young age and he understands the need for lifelong anticoagulation with coumadin. He has no contraindication to anticoagulation and feels that he can be compliant with that. I discussed the operative procedure with the patient and his wife including alternatives, benefits and risks; including but not limited to bleeding, blood transfusion, infection, stroke, myocardial infarction, graft failure, heart block requiring a permanent pacemaker, organ dysfunction, and death. Frank [name] understands and agrees to proceed.   Preparation:  The patient was seen in the preoperative holding area and the correct patient, correct operation were confirmed with the patient after reviewing the medical record and catheterization. The consent was signed by me. Preoperative antibiotics were given. A pulmonary arterial line and radial arterial line were placed by the anesthesia team. The patient was taken back to the operating room and positioned supine on the operating room table. After being placed under general endotracheal anesthesia by the anesthesia team a foley catheter was placed. The neck, chest, abdomen, and both legs were prepped with betadine soap and solution and draped in the usual sterile manner. A surgical time-out was  taken and the correct patient and operative procedure were confirmed with the nursing and anesthesia staff.  TEE: Performed by Dr. Delma Freeze                *Paton*         *[facility]*            [address], [place] [address]              [phone]  ------------------------------------------------------------------- Transesophageal Echocardiography  Patient:  Frank [name], Frank [name] MR #:    [id number] Study Date: 11/27/2014 Gender:   M Age:    49 Height: Weight: BSA: Pt. Status: Room:    2S11C  ADMITTING  Gilford Raid, MD ATTENDING  Gilford Raid, MD ORDERING   Gilford Raid, MD REFERRING  Gilford Raid, MD PERFORMING  Duane Boston, MD SONOGRAPHER Donata Clay  cc:  ------------------------------------------------------------------- LV EF: 50% -  55%  ------------------------------------------------------------------- Indications:   Aortic valve repair/replacement  ------------------------------------------------------------------- Study Conclusions  - Left ventricle: There was mild concentric hypertrophy. Systolic function was normal. The estimated ejection fraction was in the range of 50% to 55%. Wall motion was normal; there were no regional wall motion abnormalities. - Aortic valve: Mildly dilated, severely calcified annulus. Possibly bicuspid; severely thickened, severely calcified leaflets. Cusp separation was severely reduced. There was critical stenosis. There was moderate regurgitation directed towards the mitral anterior leaflet. - Mitral valve: 1 centimeter mass noted that seemed to be attached to the origin of the anterior leaflet of the mitral valve. There is no flow seen within the mass, and the appearance is brighter than normal tissue. There was mild regurgitation.  Impressions:  - Excellent prosthetic valve function without evidence for perivalvular leak. Other valves unchanged. The mass attached to the mitral valve was removed  Diagnostic transesophageal echocardiography. M-mode, 2D, spectral Doppler,  and color Doppler. Birthdate: Patient birthdate: 07-03-1966. Age: Patient is 49 yr old. Sex: Gender: male. Patient status: Inpatient. Study date: Study date: 11/27/2014. Study time: 08:59 AM. Location: Operating room.  -------------------------------------------------------------------  ------------------------------------------------------------------- Left ventricle: There was mild concentric hypertrophy. Systolic function was normal. The estimated ejection fraction was in the range of 50% to 55%. Wall motion was normal; there were no regional wall motion abnormalities.  ------------------------------------------------------------------- Aortic valve:  Mildly dilated, severely calcified annulus. Possibly bicuspid; severely thickened, severely calcified leaflets. Cusp separation was severely reduced. Doppler:  There was critical stenosis.  There was moderate regurgitation directed towards the mitral anterior leaflet.  ------------------------------------------------------------------- Aorta: The aorta was normal, not dilated, and non-diseased.  ------------------------------------------------------------------- Mitral valve: 1 centimeter mass noted that seemed to be attached to the origin of the anterior leaflet of the mitral valve. There is no flow seen within the mass, and the appearance is brighter than normal tissue. Mildly thickened leaflets . Doppler: There was mild regurgitation.  ------------------------------------------------------------------- Left atrium:  No evidence of thrombus in the atrial cavity or appendage. No evidence of thrombus in the appendage.  ------------------------------------------------------------------- Atrial septum: No defect or patent foramen ovale was identified.  ------------------------------------------------------------------- Right ventricle: The cavity size was normal. Wall thickness was normal. Systolic  function was normal.  ------------------------------------------------------------------- Pulmonic valve:  Structurally normal valve.  Cusp separation was normal. No evidence of vegetation. Doppler: There was mild regurgitation.  ------------------------------------------------------------------- Tricuspid valve:  Structurally normal valve.  Leaflet separation was normal. No evidence of vegetation. Doppler: There was mild regurgitation.  -------------------------------------------------------------------  Right atrium: The atrium was normal in size. No evidence of thrombus in the atrial cavity or appendage.  ------------------------------------------------------------------- Pericardium: The pericardium was normal in appearance. There was no pericardial effusion.  ------------------------------------------------------------------- Post bypass:  ------------------------------------------------------------------- Post procedure conclusions Left ventricle: Good LVEF. Aortic valve:  - Prosthetic valve well seated in the Aortic annulus. No AI seen in LV outflow tract. Post replacement images demonstrate no residual valvular insufficiency or perivalvular leak.  Ascending Aorta:  - The aorta was normal, not dilated, and non-diseased.  ------------------------------------------------------------------- Prepared and Electronically Authenticated by  Duane Boston, MD 2016-04-11T15:34:59   Cardiopulmonary Bypass:  A median sternotomy was performed. The pericardium was opened in the midline. Right ventricular function appeared normal. The ascending aorta was of normal size and had no palpable plaque. There were no contraindications to aortic cannulation or cross-clamping. The patient was fully systemically heparinized and the ACT was maintained > 400 sec. The distal ascending aorta was cannulated with a 20 F aortic cannula for arterial inflow. Venous cannulation was  performed via the right atrial appendage using a two-staged venous cannula. A temperature probe was inserted into the interventricular septum and an insulating pad was placed in the pericardium. CO2 was insufflated into the pericardium throughout the case to minimize intracardiac air.   Resection and grafting of ascending aortic aneurysm:  The patient was placed on cardiopulmonary bypass and a left ventricular vent was placed via the right superior pulmonary vein. Systemic cooling was begun with a goal temperature of 18 degrees centigrade by bladder and rectal temperature probes. A retrograde cardioplegia cannula was placed through the right atrium into the coronary sinus without difficulty. A retrograde cerebral perfusion cannula was placed into the SVC through a pursestring suture and the SVC was encircled with a silastic tape. After 30 minutes of cooling the target temperature of 18 degrees centigrade was reached. Cerebral oximetry was 70% bilaterally. BIS was zero. The patient was given Propofol and 125 mg of Solumedrol. The head was packed in ice. The bed was placed in steep trendelenburg. Circulatory arrest was begun and the blood volume emptied into the venous reservoir. Continuous retrograde cerebral perfusion was begun and the SVC occluded with the silastic tape. Cold blood retrograde cardioplegia was given and myocardial temperature dropped to 10 degrees centigrade. Additional doses were given at approximately 20 minute intervals throughout the period of circulatory arrest and cross-clamping. Complete diastolic arrest was maintained. The aortic cannula was removed. The aorta was transected just proximal to the innominate artery beveling the resection out along the undersurface of the aortic arch (Hemiarch replacement). The aortic diameter was measured at 28 mm here. A 28 x 10 mm Hemasheild Platinum vascular graft was prepared. Optometrist # [id number], Lot # [id number]). It was anastomosed to the aortic arch  in an end to end manner using 3-0 prolene continuous suture with a felt strip to reinforce the anastomisis. A light coating of CoSeal was applied to seal needle holes. The arterial end of the bypass circuit was then connected to the 33mm side arm graft and circulation was slowly resumed. The tape was removed from the SVC. The aortic graft was cross-clamped proximal to the side arm graft and full CPB support was resumed. Circulatory arrest time was 17 minutes. Retrograde cerebraplegia time was 14 minutes.   Bentall Procedure:   The ascending aorta was mobilized from the right pulmonary artery and main PA. It was opened longitudinally and the valve inspected. It was a true bicuspid aortic valve with severe calcification  of the leaflets and the annulus. There was a small fixed central opening. The annular calcification was very thick. The right and left coronary arteries were removed from the aortic root with a button of aortic wall around the ostia. They were retracted carefully out of the way with stay sutures to prevent rotation. The native valve was excised taking care to remove all particulate debri. The annulus was decalcified with rongeurs. The debridement was difficult and tedious due to the amount of calcium. There was a collection of calcium and white, thick liquid beneath the left coronary cusp at the annulus and this extended down to the intervalvular fibrosa. I think this was what was seen at the base of the anterior mitral leaflet on TEE. It was completely debrided and the valve sutures use to approximate this area. I did culture this material but the gram stain was not done. I don't think this was infection. The annulus was sized and a 21 mm St. Jude Mechanical Valved Graft was chosen. ( Ref # [id number], Serial # [id number]) A series of pledgetted 2-0 Ethibond horizontal mattress sutures were placed around the annulus with the pledgets in a sub-annular position. The sutures were placed through a  strip of autologous pericardium to reinforce the annulus and then the valve sewing ring. The valve was lowered into place and the sutures at the hinge posts tied first followed by the remaining sutures. The valve seated nicely. The discs moved normally. Small openings were made in the graft for the coronary anastomoses using a thermal cautery. Then the left and right coronary buttons were anastomosed to the graft in an end to side manner using continuous 5-0 prolene suture. A light coating of CoSeal was applied to each anastomosis for hemostasis. The two grafts were then cut to the appropriate length and anastomosed end to end using continuous 3-0 prolene suture. CoSeal was applied to seal the needle holes in the grafts. A vent cannula was placed into the graft to remove any air. Deairing maneuvers were performed and the bed placed in trendelenburg position.   Completion:  The patient was rewarmed to 37 degrees Centigrade. The crossclamp was removed with a time of 169 minutes. There was spontaneous return of a complete heart block rhythm.   The vascular anastomoses all appeared hemostatic. Two temporary epicardial pacing wires were placed on the right atrium and two on the right ventricle. The patient was weaned from CPB without difficulty on no inotropes. CPB time was 241 minutes. Cardiac output was 5 LPM. Heparin was fully reversed with protamine and the aortic and venous cannulas removed. Hemostasis was achieved. Mediastinal and left pleural drainage tubes were placed. The sternum was closed with #6 stainless steel wires. The fascia was closed with continuous # 1 vicryl suture. The subcutaneous tissue was closed with 2-0 vicryl continuous suture. The skin was closed with 3-0 vicryl subcuticular suture. All sponge, needle, and instrument counts were reported correct at the end of the case. Dry sterile dressings were placed over the incisions and around the chest tubes which were connected to pleurevac  suction. The patient was then transported to the surgical intensive care unit in critical but stable condition.

## 2014-11-27 NOTE — Anesthesia Preprocedure Evaluation (Addendum)
Anesthesia Evaluation  Patient identified by MRN, date of birth, ID band Patient awake    Reviewed: Allergy & Precautions, H&P , NPO status , Patient's Chart, lab work & pertinent test results  History of Anesthesia Complications (+) PONV and history of anesthetic complications  Airway Mallampati: I  TM Distance: >3 FB Neck ROM: Full    Dental  (+) Teeth Intact, Dental Advisory Given   Pulmonary neg pulmonary ROS,  breath sounds clear to auscultation        Cardiovascular hypertension, Pt. on home beta blockers - angina- Past MI + dysrhythmias + Valvular Problems/Murmurs AS Rhythm:Regular + Systolic murmurs Study Conclusions  - Left ventricle: The cavity size was normal. Wall thickness was increased in a pattern of mild LVH. Systolic function was normal. The estimated ejection fraction was in the range of 55% to 60%. - Aortic valve: The aortic valve is bicuspid and is heavily calcified with restricted leaflet motion. Peak and mean gradients through the valve are 100 and 59 mm Hg respectively, consistent with severe AS. The calculated AVA is 0.6 cm2. There is moderate, eccentric AI. There was mild regurgitation. - Aorta: Aortic root dimension: 39 mm (ED). - Aortic root: The aortic root is mildly dilated. - Mitral valve: Calcified annulus. Mildly thickened leaflets . There was mild regurgitation.    Neuro/Psych negative neurological ROS  negative psych ROS   GI/Hepatic Neg liver ROS, GERD-  Medicated and Controlled,  Endo/Other  negative endocrine ROS  Renal/GU negative Renal ROS     Musculoskeletal Right acl tear   Abdominal   Peds  Hematology negative hematology ROS (+)   Anesthesia Other Findings   Reproductive/Obstetrics                           Anesthesia Physical  Anesthesia Plan  ASA: IV  Anesthesia Plan: General   Post-op Pain Management:    Induction:  Intravenous  Airway Management Planned: Oral ETT  Additional Equipment: Arterial line, 3D TEE and Ultrasound Guidance Line Placement  Intra-op Plan:   Post-operative Plan: Post-operative intubation/ventilation  Informed Consent: I have reviewed the patients History and Physical, chart, labs and discussed the procedure including the risks, benefits and alternatives for the proposed anesthesia with the patient or authorized representative who has indicated his/her understanding and acceptance.   Dental advisory given  Plan Discussed with: Anesthesiologist and CRNA  Anesthesia Plan Comments:       Anesthesia Quick Evaluation

## 2014-11-27 NOTE — Anesthesia Procedure Notes (Addendum)
Procedure Name: Intubation Date/Time: 11/27/2014 7:39 AM Performed by: [place] Pink Pre-anesthesia Checklist: Patient identified, Emergency Drugs available, Suction available and Patient being monitored Patient Re-evaluated:Patient Re-evaluated prior to inductionOxygen Delivery Method: Circle system utilized and Simple face mask Preoxygenation: Pre-oxygenation with 100% oxygen Intubation Type: IV induction Ventilation: Mask ventilation without difficulty and Oral airway inserted - appropriate to patient size Laryngoscope Size: Miller and 2 Grade View: Grade I Tube type: Oral Tube size: 8.0 mm Number of attempts: 1 Airway Equipment and Method: Stylet and Oral airway      The patient was identified and consent obtained.  TO was performed, and full barrier precautions were used.  The skin was anesthetized with lidocaine.  Once the vein was located with the 22 ga. needle using ultrasound guidance , the wire was inserted into the vein.  The wire location was confirmed with ultrasound.  The insertion site was dilated and the introducer was carefully inserted and sutured in place. The PAC was checked, and floated into the PA.  Once in the PA, the catheter was secured. The patient tolerated the procedure well.  CXR was ordered for PACU. Start: 7654 End: 0702 J. Tedra Senegal, MD

## 2014-11-27 NOTE — Anesthesia Postprocedure Evaluation (Signed)
  Anesthesia Post-op Note  Patient: Frank [name]  Procedure(s) Performed: Procedure(s) with comments: BENTALL PROCEDURE (N/A) - WITH CIRC ARREST TRANSESOPHAGEAL ECHOCARDIOGRAM (TEE) (N/A)  Patient Location: 2S11  Anesthesia Type:General  Level of Consciousness: sedated  Airway and Oxygen Therapy: Patient remains intubated per anesthesia plan and Patient placed on Ventilator (see vital sign flow sheet for setting)  Post-op Pain: none  Post-op Assessment: Post-op Vital signs reviewed, Patient's Cardiovascular Status Stable, Respiratory Function Stable and Pain level controlled  Post-op Vital Signs: Reviewed and stable  Last Vitals:  Filed Vitals:   11/27/14 1456  BP: 104/65  Pulse: 85  Temp:   Resp:     Complications: No apparent anesthesia complications

## 2014-11-27 NOTE — Procedures (Signed)
Extubation Procedure Note  Patient Details:   Name: [name] DOB: 1966-04-15 MRN: [id number]   Airway Documentation:     Evaluation  O2 sats: stable throughout Complications: No apparent complications Patient did tolerate procedure well. Bilateral Breath Sounds: Clear Suctioning: Oral Yes   Pt was extubated to 3 LPM nasal cannula. Pt had positive cuff leak. NIF -55 and VC 1.5. Pt had a good strong productive cough. No stridor noted. BBS clear. [name]. Vitals stable. SPO2 99%. RN at bedside. RT will continue to monitor.   Lilyth Lawyer M 11/27/2014, 8:46 PM

## 2014-11-27 NOTE — Progress Notes (Signed)
Pt placed back to full support due to patient still too sleepy. Will try again.

## 2014-11-27 NOTE — Transfer of Care (Signed)
Immediate Anesthesia Transfer of Care Note  Patient: Frank [name]  Procedure(s) Performed: Procedure(s) with comments: BENTALL PROCEDURE (N/A) - WITH CIRC ARREST TRANSESOPHAGEAL ECHOCARDIOGRAM (TEE) (N/A)  Patient Location: ICU  Anesthesia Type:General  Level of Consciousness: sedated  Airway & Oxygen Therapy: Patient remains intubated per anesthesia plan and Patient placed on Ventilator (see vital sign flow sheet for setting)  Post-op Assessment: Report given to RN  Post vital signs: Reviewed and stable  Last Vitals:  Filed Vitals:   11/27/14 1456  BP: 104/65  Pulse: 85  Temp:   Resp:     Complications: No apparent anesthesia complications

## 2014-11-27 NOTE — Progress Notes (Signed)
      [address]       [place] [address]             [phone]      Intubated, just starting to stir  BP 100/77 mmHg  Pulse 77  Temp(Src) 95.9 F (35.5 C) (Core (Comment))  Resp 12  Wt 178 lb (80.74 kg)  SpO2 96%  CI= 1.35, PAD= 12- pacer turned up to 90   Intake/Output Summary (Last 24 hours) at 11/27/14 1739 Last data filed at 11/27/14 1700  Gross per 24 hour  Intake 3649.55 ml  Output   3310 ml  Net 339.55 ml    Hct= 28, K= 4.3 PLT= 92 K but no significant bleeding  Doing well early postop Optimize rate, preload/ afterload for index  Remo Lipps C. Roxan Hockey, MD [facility] [phone]

## 2014-11-28 ENCOUNTER — Encounter (HOSPITAL_COMMUNITY): Payer: Self-pay | Admitting: *Deleted

## 2014-11-28 ENCOUNTER — Inpatient Hospital Stay (HOSPITAL_COMMUNITY): Payer: 59

## 2014-11-28 LAB — GLUCOSE, CAPILLARY
GLUCOSE-CAPILLARY: 143 mg/dL — AB (ref 70–99)
GLUCOSE-CAPILLARY: 132 mg/dL — AB (ref 70–99)
Glucose-Capillary: 113 mg/dL — ABNORMAL HIGH (ref 70–99)
Glucose-Capillary: 121 mg/dL — ABNORMAL HIGH (ref 70–99)
Glucose-Capillary: 121 mg/dL — ABNORMAL HIGH (ref 70–99)
Glucose-Capillary: 131 mg/dL — ABNORMAL HIGH (ref 70–99)

## 2014-11-28 LAB — BASIC METABOLIC PANEL
Anion gap: 8 (ref 5–15)
BUN: 17 mg/dL (ref 6–23)
CALCIUM: 8.1 mg/dL — AB (ref 8.4–10.5)
CO2: 23 mmol/L (ref 19–32)
Chloride: 108 mmol/L (ref 96–112)
Creatinine, Ser: 1.3 mg/dL (ref 0.50–1.35)
GFR calc Af Amer: 73 mL/min — ABNORMAL LOW (ref >90)
GFR, EST NON AFRICAN AMERICAN: 63 mL/min — AB (ref >90)
GLUCOSE: 126 mg/dL — AB (ref 70–99)
Potassium: 5.2 mmol/L — ABNORMAL HIGH (ref 3.5–5.1)
Sodium: 139 mmol/L (ref 135–145)

## 2014-11-28 LAB — CBC
HEMATOCRIT: 27.8 % — AB (ref 39.0–52.0)
HEMATOCRIT: 27.7 % — AB (ref 39.0–52.0)
Hemoglobin: 9.6 g/dL — ABNORMAL LOW (ref 13.0–17.0)
Hemoglobin: 9.4 g/dL — ABNORMAL LOW (ref 13.0–17.0)
MCH: 29.3 pg (ref 26.0–34.0)
MCH: 29.2 pg (ref 26.0–34.0)
MCHC: 34.5 g/dL (ref 30.0–36.0)
MCHC: 33.9 g/dL (ref 30.0–36.0)
MCV: 84.8 fL (ref 78.0–100.0)
MCV: 86 fL (ref 78.0–100.0)
Platelets: 114 10*3/uL — ABNORMAL LOW (ref 150–400)
Platelets: 128 10*3/uL — ABNORMAL LOW (ref 150–400)
RBC: 3.28 MIL/uL — AB (ref 4.22–5.81)
RBC: 3.22 MIL/uL — ABNORMAL LOW (ref 4.22–5.81)
RDW: 14.3 % (ref 11.5–15.5)
RDW: 14.9 % (ref 11.5–15.5)
WBC: 12.8 10*3/uL — AB (ref 4.0–10.5)
WBC: 12.2 10*3/uL — ABNORMAL HIGH (ref 4.0–10.5)

## 2014-11-28 LAB — CREATININE, SERUM
Creatinine, Ser: 1.17 mg/dL (ref 0.50–1.35)
GFR calc Af Amer: 83 mL/min — ABNORMAL LOW (ref >90)
GFR calc non Af Amer: 72 mL/min — ABNORMAL LOW (ref >90)

## 2014-11-28 LAB — POCT I-STAT, CHEM 8
BUN: 20 mg/dL (ref 6–23)
CALCIUM ION: 1.25 mmol/L — AB (ref 1.12–1.23)
CHLORIDE: 101 mmol/L (ref 96–112)
CREATININE: 1.2 mg/dL (ref 0.50–1.35)
Glucose, Bld: 165 mg/dL — ABNORMAL HIGH (ref 70–99)
HCT: 27 % — ABNORMAL LOW (ref 39.0–52.0)
Hemoglobin: 9.2 g/dL — ABNORMAL LOW (ref 13.0–17.0)
Potassium: 4.5 mmol/L (ref 3.5–5.1)
Sodium: 136 mmol/L (ref 135–145)
TCO2: 20 mmol/L (ref 0–100)

## 2014-11-28 LAB — MAGNESIUM
MAGNESIUM: 2.4 mg/dL (ref 1.5–2.5)
Magnesium: 2.3 mg/dL (ref 1.5–2.5)

## 2014-11-28 MED ORDER — ASPIRIN 81 MG PO CHEW
81.0000 mg | CHEWABLE_TABLET | Freq: Every day | ORAL | Status: DC
  Administered 2014-11-28: 81 mg
  Filled 2014-11-28: qty 1

## 2014-11-28 MED ORDER — INSULIN ASPART 100 UNIT/ML ~~LOC~~ SOLN
0.0000 [IU] | SUBCUTANEOUS | Status: DC
  Administered 2014-11-28 (×3): 2 [IU] via SUBCUTANEOUS

## 2014-11-28 MED ORDER — INSULIN ASPART 100 UNIT/ML ~~LOC~~ SOLN
0.0000 [IU] | SUBCUTANEOUS | Status: DC
  Administered 2014-11-28 (×2): 2 [IU] via SUBCUTANEOUS

## 2014-11-28 MED ORDER — INSULIN ASPART 100 UNIT/ML ~~LOC~~ SOLN: 0.0000 [IU] | SUBCUTANEOUS | Status: DC

## 2014-11-28 MED ORDER — FUROSEMIDE 10 MG/ML IJ SOLN
20.0000 mg | Freq: Once | INTRAMUSCULAR | Status: AC
  Administered 2014-11-28: 20 mg via INTRAVENOUS
  Filled 2014-11-28: qty 2

## 2014-11-28 MED ORDER — ENOXAPARIN SODIUM 40 MG/0.4ML ~~LOC~~ SOLN
40.0000 mg | Freq: Every day | SUBCUTANEOUS | Status: DC
  Administered 2014-11-28 – 2014-11-29 (×2): 40 mg via SUBCUTANEOUS
  Filled 2014-11-28 (×3): qty 0.4

## 2014-11-28 MED ORDER — ASPIRIN EC 81 MG PO TBEC
81.0000 mg | DELAYED_RELEASE_TABLET | Freq: Every day | ORAL | Status: DC
  Filled 2014-11-28 (×2): qty 1

## 2014-11-28 MED FILL — Heparin Sodium (Porcine) Inj 1000 Unit/ML: INTRAMUSCULAR | Qty: 30 | Status: AC

## 2014-11-28 MED FILL — Magnesium Sulfate Inj 50%: INTRAMUSCULAR | Qty: 10 | Status: AC

## 2014-11-28 MED FILL — Potassium Chloride Inj 2 mEq/ML: INTRAVENOUS | Qty: 40 | Status: AC

## 2014-11-28 NOTE — Progress Notes (Signed)
CT surgery p.m. Rounds  Stable day postop day 1 after Bentall procedure Maintaining sinus rhythm, ambulated out of room Neo-Synephrine weaned off, will DC A-line Weight up 15 pounds-Will give 1 dose Lasix this p.m., Foley in place P.m. labs reviewed and are satisfactory, potassium 4.5

## 2014-11-28 NOTE — Progress Notes (Signed)
1 Day Post-Op Procedure(s) (LRB): BENTALL PROCEDURE (N/A) TRANSESOPHAGEAL ECHOCARDIOGRAM (TEE) (N/A) Subjective:  No complaints  Objective: Vital signs in last 24 hours: Temp:  [95.4 F (35.2 C)-98.4 F (36.9 C)] 97.9 F (36.6 C) (04/12 0800) Pulse Rate:  [8-88] 88 (04/12 0800) Cardiac Rhythm:  [-] Normal sinus rhythm (04/12 0800) Resp:  [10-23] 15 (04/12 0800) BP: (80-109)/(52-77) 80/65 mmHg (04/12 0700) SpO2:  [90 %-99 %] 96 % (04/12 0800) Arterial Line BP: (83-149)/(54-88) 95/54 mmHg (04/12 0800) FiO2 (%):  [40 %-80 %] 40 % (04/11 2003) Weight:  [86.5 kg (190 lb 11.2 oz)] 86.5 kg (190 lb 11.2 oz) (04/12 0500)  Hemodynamic parameters for last 24 hours: PAP: (18-35)/(9-20) 35/16 mmHg CO:  [3.6 L/min-5.8 L/min] 5.2 L/min CI:  [1.3 L/min/m2-2.1 L/min/m2] 1.9 L/min/m2  Intake/Output from previous day: 04/11 0701 - 04/12 0700 In: 4742.2 [P.O.:180; I.V.:4002.2; NG/GT:60; IV Piggyback:500] Out: 9518 [Urine:2790; Emesis/NG output:250; Blood:1200; Chest Tube:380] Intake/Output this shift: Total I/O In: 60 [I.V.:60] Out: 65 [Urine:65]  General appearance: alert and cooperative Neurologic: intact Heart: regular rate and rhythm, S1, S2 normal, no murmur, click, rub or gallop Lungs: clear to auscultation bilaterally Abdomen: soft, non-tender; bowel sounds normal; no masses,  no organomegaly Extremities: edema mild Wound: dressing dry  Lab Results:  Recent Labs  11/27/14 2030 11/28/14 0400  WBC 14.3* 12.8*  HGB 9.8* 9.6*  HCT 28.0* 27.8*  PLT 109* 114*   BMET:  Recent Labs  11/27/14 1457 11/27/14 2030 11/28/14 0400  NA 141  --  139  K 4.3  --  5.2*  CL 107  --  108  CO2  --   --  23  GLUCOSE 84  --  126*  BUN 15  --  17  CREATININE 1.20 1.36* 1.30  CALCIUM  --   --  8.1*    PT/INR:  Recent Labs  11/27/14 1500  LABPROT 17.2*  INR 1.39   ABG    Component Value Date/Time   PHART 7.354 11/27/2014 2025   HCO3 21.9 11/27/2014 2025   TCO2 23 11/27/2014  2025   ACIDBASEDEF 4.0* 11/27/2014 2025   O2SAT 96.0 11/27/2014 2025   CBG (last 3)   Recent Labs  11/27/14 1936 11/28/14 0008 11/28/14 0401  GLUCAP 137* 121* 131*   CXR ok  ECG: no acute changes  Assessment/Plan: S/P Procedure(s) (LRB): BENTALL PROCEDURE (N/A) TRANSESOPHAGEAL ECHOCARDIOGRAM (TEE) (N/A)  He is hemodynamically stable on some neo. Wean as tolerated. Hold off on beta blocker for now. CT output low. Will keep tubes in this am and get out of bed. Mobilize Continue foley due to patient in ICU and urinary output monitoring See progression orders  Plan to start coumadin tomorrow.   LOS: 1 day    [name] 11/28/2014

## 2014-11-29 ENCOUNTER — Inpatient Hospital Stay (HOSPITAL_COMMUNITY): Payer: 59

## 2014-11-29 LAB — GLUCOSE, CAPILLARY
GLUCOSE-CAPILLARY: 102 mg/dL — AB (ref 70–99)
Glucose-Capillary: 110 mg/dL — ABNORMAL HIGH (ref 70–99)
Glucose-Capillary: 99 mg/dL (ref 70–99)

## 2014-11-29 LAB — BASIC METABOLIC PANEL
Anion gap: 6 (ref 5–15)
BUN: 17 mg/dL (ref 6–23)
CHLORIDE: 101 mmol/L (ref 96–112)
CO2: 28 mmol/L (ref 19–32)
Calcium: 8.3 mg/dL — ABNORMAL LOW (ref 8.4–10.5)
Creatinine, Ser: 1.15 mg/dL (ref 0.50–1.35)
GFR calc Af Amer: 85 mL/min — ABNORMAL LOW (ref >90)
GFR calc non Af Amer: 73 mL/min — ABNORMAL LOW (ref >90)
Glucose, Bld: 109 mg/dL — ABNORMAL HIGH (ref 70–99)
Potassium: 4.4 mmol/L (ref 3.5–5.1)
Sodium: 135 mmol/L (ref 135–145)

## 2014-11-29 LAB — CBC
HEMATOCRIT: 27 % — AB (ref 39.0–52.0)
Hemoglobin: 9 g/dL — ABNORMAL LOW (ref 13.0–17.0)
MCH: 29.2 pg (ref 26.0–34.0)
MCHC: 33.3 g/dL (ref 30.0–36.0)
MCV: 87.7 fL (ref 78.0–100.0)
Platelets: 102 10*3/uL — ABNORMAL LOW (ref 150–400)
RBC: 3.08 MIL/uL — AB (ref 4.22–5.81)
RDW: 15 % (ref 11.5–15.5)
WBC: 11.6 10*3/uL — AB (ref 4.0–10.5)

## 2014-11-29 LAB — PROTIME-INR
INR: 1.18 (ref 0.00–1.49)
PROTHROMBIN TIME: 15.1 s (ref 11.6–15.2)

## 2014-11-29 MED ORDER — CHLORHEXIDINE GLUCONATE CLOTH 2 % EX PADS: 6.0000 | MEDICATED_PAD | Freq: Every day | CUTANEOUS | Status: DC

## 2014-11-29 MED ORDER — FUROSEMIDE 40 MG PO TABS
40.0000 mg | ORAL_TABLET | Freq: Every day | ORAL | Status: DC
  Administered 2014-11-29: 40 mg via ORAL
  Filled 2014-11-29 (×2): qty 1

## 2014-11-29 MED ORDER — POTASSIUM CHLORIDE CRYS ER 20 MEQ PO TBCR
20.0000 meq | EXTENDED_RELEASE_TABLET | Freq: Two times a day (BID) | ORAL | Status: DC
  Administered 2014-11-29 (×2): 20 meq via ORAL
  Filled 2014-11-29 (×4): qty 1

## 2014-11-29 MED ORDER — WARFARIN - PHYSICIAN DOSING INPATIENT
Freq: Every day | Status: DC
  Administered 2014-11-29 – 2014-12-01 (×3)

## 2014-11-29 MED ORDER — WARFARIN SODIUM 5 MG PO TABS
5.0000 mg | ORAL_TABLET | Freq: Once | ORAL | Status: AC
  Administered 2014-11-29: 5 mg via ORAL
  Filled 2014-11-29: qty 1

## 2014-11-29 MED ORDER — MUPIROCIN 2 % EX OINT
1.0000 "application " | TOPICAL_OINTMENT | Freq: Two times a day (BID) | CUTANEOUS | Status: DC
  Filled 2014-11-29: qty 22

## 2014-11-29 MED FILL — Heparin Sodium (Porcine) Inj 1000 Unit/ML: INTRAMUSCULAR | Qty: 10 | Status: AC

## 2014-11-29 MED FILL — Sodium Bicarbonate IV Soln 8.4%: INTRAVENOUS | Qty: 50 | Status: AC

## 2014-11-29 MED FILL — Sodium Chloride IV Soln 0.9%: INTRAVENOUS | Qty: 2000 | Status: AC

## 2014-11-29 MED FILL — Mannitol IV Soln 20%: INTRAVENOUS | Qty: 500 | Status: AC

## 2014-11-29 MED FILL — Lidocaine HCl IV Inj 20 MG/ML: INTRAVENOUS | Qty: 5 | Status: AC

## 2014-11-29 MED FILL — Electrolyte-R (PH 7.4) Solution: INTRAVENOUS | Qty: 4000 | Status: AC

## 2014-11-29 NOTE — Clinical Documentation Improvement (Signed)
Supporting Information: EBL: 1200 ml per 4/11 Anesthesia record. Labs: H/H: Pre-op:  14.6/41.2 4/11:  9.2/26.1 4/13:  9.0/27.0   Treatment: Albumin: 250 ml per 4/11 Anesthesia record.    Possible Clinical Condition: . Documentation of Anemia should include the type of anemia: --Hemolytic --Acute blood loss anemia --Other (please specify) . Include in documentation if Anemia is due to nutrition or mineral deficits, resulting in a nutritional anemia . Document if the Anemia is due to a neoplasm (primary and/or secondary) . Document whether the ANEMIA is "related to or due to" chemo or radiotherapy treatments . Document any "cause-and-effect" relationship between the intervention and the blood or immune disorder . Document the specific drug if anemia is drug-induced . Link any laboratory findings to a related diagnosis (if appropriate) . Document any associated diagnoses/conditions     Thank Sherian Maroon Documentation Specialist [phone] [email or web address]

## 2014-11-29 NOTE — Progress Notes (Signed)
2 Days Post-Op Procedure(s) (LRB): BENTALL PROCEDURE (N/A) TRANSESOPHAGEAL ECHOCARDIOGRAM (TEE) (N/A) Subjective:  No complaints  Objective: Vital signs in last 24 hours: Temp:  [97.3 F (36.3 C)-98.3 F (36.8 C)] 97.3 F (36.3 C) (04/13 0800) Pulse Rate:  [79-92] 82 (04/13 0800) Cardiac Rhythm:  [-] Normal sinus rhythm (04/13 0800) Resp:  [10-27] 14 (04/13 0800) BP: (91-113)/(51-76) 95/57 mmHg (04/13 0800) SpO2:  [91 %-100 %] 95 % (04/13 0800) Arterial Line BP: (94-138)/(49-70) 94/51 mmHg (04/13 0600) Weight:  [86.3 kg (190 lb 4.1 oz)] 86.3 kg (190 lb 4.1 oz) (04/13 0600)  Hemodynamic parameters for last 24 hours:    Intake/Output from previous day: 04/12 0701 - 04/13 0700 In: 1321 [P.O.:600; I.V.:621; IV Piggyback:100] Out: 6314 [HFWYO:3785; Chest Tube:390] Intake/Output this shift:    General appearance: alert and cooperative Neurologic: intact Heart: regular rate and rhythm, S1, S2 normal, no murmur, click, rub or gallop Lungs: clear to auscultation bilaterally Extremities: edema mild Wound: dressing dry  Lab Results:  Recent Labs  11/28/14 1615 11/28/14 1621 11/29/14 0400  WBC 12.2*  --  11.6*  HGB 9.4* 9.2* 9.0*  HCT 27.7* 27.0* 27.0*  PLT 128*  --  PENDING   BMET:  Recent Labs  11/28/14 0400  11/28/14 1621 11/29/14 0400  NA 139  --  136 135  K 5.2*  --  4.5 4.4  CL 108  --  101 101  CO2 23  --   --  28  GLUCOSE 126*  --  165* 109*  BUN 17  --  20 17  CREATININE 1.30  < > 1.20 1.15  CALCIUM 8.1*  --   --  8.3*  < > = values in this interval not displayed.  PT/INR:  Recent Labs  11/29/14 0400  LABPROT 15.1  INR 1.18   ABG    Component Value Date/Time   PHART 7.354 11/27/2014 2025   HCO3 21.9 11/27/2014 2025   TCO2 20 11/28/2014 1621   ACIDBASEDEF 4.0* 11/27/2014 2025   O2SAT 96.0 11/27/2014 2025   CBG (last 3)   Recent Labs  11/28/14 2031 11/29/14 0010 11/29/14 0408  GLUCAP 113* 99 110*    Assessment/Plan: S/P  Procedure(s) (LRB): BENTALL PROCEDURE (N/A) TRANSESOPHAGEAL ECHOCARDIOGRAM (TEE) (N/A)  Hemodynamically stable. No beta blocker since BP low normal. Chest tube output still 110 cc last 8 hrs x2. Will keep tubes in today and put to water seal.  Mobilize Diuresis Start coumadin today Remove sleeve and foley.  LOS: 2 days    Frank [name] 11/29/2014

## 2014-11-29 NOTE — Progress Notes (Signed)
Patient ID: Frank [name], male   DOB: [birth year], 49 y.o.   MRN: [id number] SICU Evening Rounds:  Hemodynamically stable in sinus rhythm.  Diuresed well  Chest tube output low, serosanguinous  Coumadin to start today.

## 2014-11-30 ENCOUNTER — Inpatient Hospital Stay (HOSPITAL_COMMUNITY): Payer: 59

## 2014-11-30 LAB — WOUND CULTURE
Culture: NO GROWTH
Gram Stain: NONE SEEN

## 2014-11-30 LAB — PROTIME-INR
INR: 1.18 (ref 0.00–1.49)
PROTHROMBIN TIME: 15.1 s (ref 11.6–15.2)

## 2014-11-30 MED ORDER — POTASSIUM CHLORIDE CRYS ER 20 MEQ PO TBCR
40.0000 meq | EXTENDED_RELEASE_TABLET | Freq: Once | ORAL | Status: AC
  Administered 2014-11-30: 40 meq via ORAL

## 2014-11-30 MED ORDER — ACETAMINOPHEN 325 MG PO TABS: 650.0000 mg | ORAL_TABLET | Freq: Four times a day (QID) | ORAL | Status: DC | PRN

## 2014-11-30 MED ORDER — SODIUM CHLORIDE 0.9 % IV SOLN
250.0000 mL | INTRAVENOUS | Status: DC | PRN
Start: 2014-11-30 — End: 2014-12-02

## 2014-11-30 MED ORDER — FUROSEMIDE 40 MG PO TABS
40.0000 mg | ORAL_TABLET | Freq: Once | ORAL | Status: AC
  Administered 2014-11-30: 40 mg via ORAL

## 2014-11-30 MED ORDER — OXYCODONE HCL 5 MG PO TABS
5.0000 mg | ORAL_TABLET | ORAL | Status: DC | PRN
  Administered 2014-11-30 – 2014-12-02 (×9): 10 mg via ORAL
  Filled 2014-11-30 (×9): qty 2

## 2014-11-30 MED ORDER — TRAMADOL HCL 50 MG PO TABS: 50.0000 mg | ORAL_TABLET | ORAL | Status: DC | PRN

## 2014-11-30 MED ORDER — ONDANSETRON HCL 4 MG/2ML IJ SOLN: 4.0000 mg | Freq: Four times a day (QID) | INTRAMUSCULAR | Status: DC | PRN

## 2014-11-30 MED ORDER — MOVING RIGHT ALONG BOOK
Freq: Once | Status: AC
  Administered 2014-11-30: 11:00:00
  Filled 2014-11-30: qty 1

## 2014-11-30 MED ORDER — SODIUM CHLORIDE 0.9 % IJ SOLN
3.0000 mL | INTRAMUSCULAR | Status: DC | PRN
Start: 2014-11-30 — End: 2014-12-02

## 2014-11-30 MED ORDER — WARFARIN VIDEO
Freq: Once | Status: AC
  Administered 2014-12-01: 10:00:00

## 2014-11-30 MED ORDER — WARFARIN SODIUM 5 MG PO TABS
5.0000 mg | ORAL_TABLET | Freq: Once | ORAL | Status: AC
  Administered 2014-11-30: 5 mg via ORAL
  Filled 2014-11-30 (×2): qty 1

## 2014-11-30 MED ORDER — ONDANSETRON HCL 4 MG PO TABS: 4.0000 mg | ORAL_TABLET | Freq: Four times a day (QID) | ORAL | Status: DC | PRN

## 2014-11-30 MED ORDER — DOCUSATE SODIUM 100 MG PO CAPS
200.0000 mg | ORAL_CAPSULE | Freq: Every day | ORAL | Status: DC
  Administered 2014-12-01 – 2014-12-02 (×2): 200 mg via ORAL
  Filled 2014-11-30 (×2): qty 2

## 2014-11-30 MED ORDER — METOPROLOL TARTRATE 12.5 MG HALF TABLET
12.5000 mg | ORAL_TABLET | Freq: Two times a day (BID) | ORAL | Status: DC
  Administered 2014-11-30 – 2014-12-01 (×4): 12.5 mg via ORAL
  Filled 2014-11-30 (×6): qty 1

## 2014-11-30 MED ORDER — FUROSEMIDE 40 MG PO TABS
40.0000 mg | ORAL_TABLET | Freq: Once | ORAL | Status: AC
  Administered 2014-12-01: 40 mg via ORAL
  Filled 2014-11-30: qty 1

## 2014-11-30 MED ORDER — BISACODYL 10 MG RE SUPP: 10.0000 mg | Freq: Every day | RECTAL | Status: DC | PRN

## 2014-11-30 MED ORDER — COUMADIN BOOK
Freq: Once | Status: AC
  Administered 2014-11-30: 17:00:00
  Filled 2014-11-30: qty 1

## 2014-11-30 MED ORDER — SODIUM CHLORIDE 0.9 % IJ SOLN
3.0000 mL | Freq: Two times a day (BID) | INTRAMUSCULAR | Status: DC
  Administered 2014-11-30 – 2014-12-01 (×3): 3 mL via INTRAVENOUS

## 2014-11-30 MED ORDER — POTASSIUM CHLORIDE CRYS ER 20 MEQ PO TBCR
20.0000 meq | EXTENDED_RELEASE_TABLET | Freq: Two times a day (BID) | ORAL | Status: AC
  Administered 2014-12-01 (×2): 20 meq via ORAL
  Filled 2014-11-30 (×2): qty 1

## 2014-11-30 MED ORDER — BISACODYL 5 MG PO TBEC
10.0000 mg | DELAYED_RELEASE_TABLET | Freq: Every day | ORAL | Status: DC | PRN
  Filled 2014-11-30: qty 2

## 2014-11-30 NOTE — Progress Notes (Signed)
Pt ambulated 550 feet with RN at this time; steady gait noted; pt back to room to chair; call bell w/i reach; will cont. To monitor.

## 2014-11-30 NOTE — Discharge Instructions (Addendum)
Information on my medicine - Coumadin   (Warfarin)  This medication education was reviewed with me or my healthcare representative as part of my discharge preparation.  The pharmacist that spoke with me during my hospital stay was:  [facility], Margot Chimes, [facility]  Why was Coumadin prescribed for you? Coumadin was prescribed for you because you have a blood clot or a medical condition that can cause an increased risk of forming blood clots. Blood clots can cause serious health problems by blocking the flow of blood to the heart, lung, or brain. Coumadin can prevent harmful blood clots from forming. As a reminder your indication for Coumadin is:   Blood Clot Prevention After Heart Valve Surgery  What test will check on my response to Coumadin? While on Coumadin (warfarin) you will need to have an INR test regularly to ensure that your dose is keeping you in the desired range. The INR (international normalized ratio) number is calculated from the result of the laboratory test called prothrombin time (PT).  If an INR APPOINTMENT HAS NOT ALREADY BEEN MADE FOR YOU please schedule an appointment to have this lab work done by your health care provider within 7 days. Your INR goal is usually a number between:  2 to 3 or your provider may give you a more narrow range like 2-2.5.  Ask your health care provider during an office visit what your goal INR is.  What  do you need to  know  About  COUMADIN? Take Coumadin (warfarin) exactly as prescribed by your healthcare provider about the same time each day.  DO NOT stop taking without talking to the doctor who prescribed the medication.  Stopping without other blood clot prevention medication to take the place of Coumadin may increase your risk of developing a new clot or stroke.  Get refills before you run out.  What do you do if you miss a dose? If you miss a dose, take it as soon as you remember on the same day then continue your regularly scheduled regimen  the next day.  Do not take two doses of Coumadin at the same time.  Important Safety Information A possible side effect of Coumadin (Warfarin) is an increased risk of bleeding. You should call your healthcare provider right away if you experience any of the following: ? Bleeding from an injury or your nose that does not stop. ? Unusual colored urine (red or dark brown) or unusual colored stools (red or black). ? Unusual bruising for unknown reasons. ? A serious fall or if you hit your head (even if there is no bleeding).  Some foods or medicines interact with Coumadin (warfarin) and might alter your response to warfarin. To help avoid this: ? Eat a balanced diet, maintaining a consistent amount of Vitamin K. ? Notify your provider about major diet changes you plan to make. ? Avoid alcohol or limit your intake to 1 drink for women and 2 drinks for men per day. (1 drink is 5 oz. wine, 12 oz. beer, or 1.5 oz. liquor.)  Make sure that ANY health care provider who prescribes medication for you knows that you are taking Coumadin (warfarin).  Also make sure the healthcare provider who is monitoring your Coumadin knows when you have started a new medication including herbals and non-prescription products.  Coumadin (Warfarin)  Major Drug Interactions  Increased Warfarin Effect Decreased Warfarin Effect  Alcohol (large quantities) Antibiotics (esp. Septra/Bactrim, Flagyl, Cipro) Amiodarone (Cordarone) Aspirin (ASA) Cimetidine (Tagamet) Megestrol (Megace) NSAIDs (  ibuprofen, naproxen, etc.) °Piroxicam (Feldene) °Propafenone (Rythmol SR) °Propranolol (Inderal) °Isoniazid (INH) °Posaconazole (Noxafil) Barbiturates (Phenobarbital) °Carbamazepine (Tegretol) °Chlordiazepoxide (Librium) °Cholestyramine (Questran) °Griseofulvin °Oral Contraceptives °Rifampin °Sucralfate (Carafate) °Vitamin K  ° °Coumadin® (Warfarin) Major Herbal Interactions  °Increased Warfarin Effect Decreased Warfarin Effect   °Garlic °Ginseng °Ginkgo biloba Coenzyme Q10 °Green tea °St. John’s wort   ° °Coumadin® (Warfarin) FOOD Interactions  °Eat a consistent number of servings per week of foods HIGH in Vitamin K °(1 serving = ½ cup)  °Collards (cooked, or boiled & drained) °Kale (cooked, or boiled & drained) °Mustard greens (cooked, or boiled & drained) °Parsley *serving size only = ¼ cup °Spinach (cooked, or boiled & drained) °Swiss chard (cooked, or boiled & drained) °Turnip greens (cooked, or boiled & drained)  °Eat a consistent number of servings per week of foods MEDIUM-HIGH in Vitamin K °(1 serving = 1 cup)  °Asparagus (cooked, or boiled & drained) °Broccoli (cooked, boiled & drained, or raw & chopped) °Brussel sprouts (cooked, or boiled & drained) *serving size only = ½ cup °Lettuce, raw (green leaf, endive, romaine) °Spinach, raw °Turnip greens, raw & chopped  ° °These websites have more information on Coumadin (warfarin):  www.coumadin.com; °www.ahrq.gov/consumer/coumadin.htm; ° ° ° °

## 2014-11-30 NOTE — Progress Notes (Signed)
3 Days Post-Op Procedure(s) (LRB): BENTALL PROCEDURE (N/A) TRANSESOPHAGEAL ECHOCARDIOGRAM (TEE) (N/A) Subjective:  No complaints. Slept well  Objective: Vital signs in last 24 hours: Temp:  [97.3 F (36.3 C)-98.9 F (37.2 C)] 98.9 F (37.2 C) (04/14 0357) Pulse Rate:  [77-103] 103 (04/14 0700) Cardiac Rhythm:  [-] Normal sinus rhythm (04/14 0600) Resp:  [9-27] 19 (04/14 0700) BP: (95-134)/(57-79) 128/78 mmHg (04/14 0700) SpO2:  [88 %-97 %] 90 % (04/14 0700) Weight:  [83.5 kg (184 lb 1.4 oz)-86.3 kg (190 lb 4.1 oz)] 83.5 kg (184 lb 1.4 oz) (04/14 0600)  Hemodynamic parameters for last 24 hours:    Intake/Output from previous day: 04/13 0701 - 04/14 0700 In: 643 [P.O.:600; I.V.:43] Out: 3325 [Urine:3105; Chest Tube:220] Intake/Output this shift:    General appearance: alert and cooperative Neurologic: intact Heart: regular rate and rhythm, mechanical heart sounds, no murmur, click, rub or gallop Lungs: clear to auscultation bilaterally Extremities: edema mild Wound: incisions ok  Lab Results:  Recent Labs  11/28/14 1615 11/28/14 1621 11/29/14 0400  WBC 12.2*  --  11.6*  HGB 9.4* 9.2* 9.0*  HCT 27.7* 27.0* 27.0*  PLT 128*  --  102*   BMET:  Recent Labs  11/28/14 0400  11/28/14 1621 11/29/14 0400  NA 139  --  136 135  K 5.2*  --  4.5 4.4  CL 108  --  101 101  CO2 23  --   --  28  GLUCOSE 126*  --  165* 109*  BUN 17  --  20 17  CREATININE 1.30  < > 1.20 1.15  CALCIUM 8.1*  --   --  8.3*  < > = values in this interval not displayed.  PT/INR:  Recent Labs  11/30/14 0226  LABPROT 15.1  INR 1.18   ABG    Component Value Date/Time   PHART 7.354 11/27/2014 2025   HCO3 21.9 11/27/2014 2025   TCO2 20 11/28/2014 1621   ACIDBASEDEF 4.0* 11/27/2014 2025   O2SAT 96.0 11/27/2014 2025   CBG (last 3)   Recent Labs  11/29/14 0010 11/29/14 0408 11/29/14 0824  GLUCAP 99 110* 102*    Assessment/Plan: S/P Procedure(s) (LRB): BENTALL PROCEDURE  (N/A) TRANSESOPHAGEAL ECHOCARDIOGRAM (TEE) (N/A)  He remains hemodynamically stable. Will start low dose lopressor.  Remove MT's   Continue coumadin  Weight is 6 lbs over preop. Continue diuresis.  Transfer to 2W   LOS: 3 days    [name] 11/30/2014

## 2014-11-30 NOTE — Progress Notes (Signed)
64 Pt stated he has walked twice today and walked over as one of the walks. Looks and feels good. Knows to walk later with staff. Will follow up tomorrow. Graylon Good RN BSN 11/30/2014 12:31 PM

## 2014-12-01 LAB — CBC
HCT: 26 % — ABNORMAL LOW (ref 39.0–52.0)
HEMOGLOBIN: 8.8 g/dL — AB (ref 13.0–17.0)
MCH: 29.1 pg (ref 26.0–34.0)
MCHC: 33.8 g/dL (ref 30.0–36.0)
MCV: 86.1 fL (ref 78.0–100.0)
Platelets: 168 10*3/uL (ref 150–400)
RBC: 3.02 MIL/uL — ABNORMAL LOW (ref 4.22–5.81)
RDW: 14.2 % (ref 11.5–15.5)
WBC: 8.1 10*3/uL (ref 4.0–10.5)

## 2014-12-01 LAB — PROTIME-INR
INR: 1.39 (ref 0.00–1.49)
PROTHROMBIN TIME: 17.2 s — AB (ref 11.6–15.2)

## 2014-12-01 MED ORDER — FE FUMARATE-B12-VIT C-FA-IFC PO CAPS
1.0000 | ORAL_CAPSULE | Freq: Three times a day (TID) | ORAL | Status: DC
  Administered 2014-12-01 – 2014-12-02 (×4): 1 via ORAL
  Filled 2014-12-01 (×7): qty 1

## 2014-12-01 MED ORDER — WARFARIN SODIUM 5 MG PO TABS
5.0000 mg | ORAL_TABLET | Freq: Once | ORAL | Status: AC
  Administered 2014-12-01: 5 mg via ORAL
  Filled 2014-12-01: qty 1

## 2014-12-01 NOTE — Progress Notes (Signed)
CARDIAC REHAB PHASE I   PRE:  Rate/Rhythm: 89 SR  BP:  Supine:   Sitting: 102/76  Standing:    SaO2: 95%RA  MODE:  Ambulation: 890 ft   POST:  Rate/Rhythm: 99 SR  BP:  Supine:   Sitting: 130/76  Standing:    SaO2: 98%RA 1012-1106 Pt walked 890 ft on RA with steady gait. Tolerated well. Education completed with pt and family. Wrote down for pt how to view post op video. Discussed CRP 2 and pt gave permission to refer to [facility] program.   Graylon Good, RN BSN  12/01/2014 11:05 AM

## 2014-12-01 NOTE — Progress Notes (Signed)
Patient off bedrest, vitals stable, patient sitting in chair, call bell within reach, family at bedside, will continue to monitor.  Rowe Pavy, RN

## 2014-12-01 NOTE — Progress Notes (Signed)
DC EPW per MD orders and protocol; met resistance with atrial wires, Gina, PA came and DC'd atrial wires, wires intact, patient on bedrest for one hour, call bell within reach, wife at bedside, will continue to monitor.  Rowe Pavy, RN

## 2014-12-01 NOTE — Progress Notes (Addendum)
      [address]       [place] [address]             [phone]      4 Days Post-Op Procedure(s) (LRB): BENTALL PROCEDURE (N/A) TRANSESOPHAGEAL ECHOCARDIOGRAM (TEE) (N/A)   Subjective:  Mr. Mantz is doing well.  He is ambulating independently.  + BM  Objective: Vital signs in last 24 hours: Temp:  [97.7 F (36.5 C)-98.7 F (37.1 C)] 98.4 F (36.9 C) (04/15 0551) Pulse Rate:  [83-93] 89 (04/15 0551) Cardiac Rhythm:  [-] Normal sinus rhythm (04/15 0600) Resp:  [11-18] 18 (04/15 0551) BP: (98-126)/(56-85) 113/68 mmHg (04/15 0551) SpO2:  [92 %-98 %] 92 % (04/15 0551) Weight:  [181 lb (82.1 kg)] 181 lb (82.1 kg) (04/15 0551)  Intake/Output from previous day: 04/14 0701 - 04/15 0700 In: 603 [P.O.:600; I.V.:3] Out: 3510 [Urine:3510]  General appearance: alert, cooperative and no distress Heart: regular rate and rhythm Lungs: clear to auscultation bilaterally Abdomen: soft, non-tender; bowel sounds normal; no masses,  no organomegaly Extremities: edema trace Wound: clean and dry  Lab Results:  Recent Labs  11/29/14 0400 12/01/14 0512  WBC 11.6* 8.1  HGB 9.0* 8.8*  HCT 27.0* 26.0*  PLT 102* 168   BMET:  Recent Labs  11/28/14 1621 11/29/14 0400  NA 136 135  K 4.5 4.4  CL 101 101  CO2  --  28  GLUCOSE 165* 109*  BUN 20 17  CREATININE 1.20 1.15  CALCIUM  --  8.3*    PT/INR:  Recent Labs  12/01/14 0512  LABPROT 17.2*  INR 1.39   ABG    Component Value Date/Time   PHART 7.354 11/27/2014 2025   HCO3 21.9 11/27/2014 2025   TCO2 20 11/28/2014 1621   ACIDBASEDEF 4.0* 11/27/2014 2025   O2SAT 96.0 11/27/2014 2025   CBG (last 3)   Recent Labs  11/29/14 0010 11/29/14 0408 11/29/14 0824  GLUCAP 99 110* 102*    Assessment/Plan: S/P Procedure(s) (LRB): BENTALL PROCEDURE (N/A) TRANSESOPHAGEAL ECHOCARDIOGRAM (TEE) (N/A)  1. CV- NSR- continue Lopressor at low dose 2. INR 1.39, continue Coumadin at 5 mg daily 3. Renal-  weight is trending down, continue lasix for now 4. Expected Acute Blood Loss Anemia- mild, Hgb at 8.8, will start iron supplements 5. 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 He looks great. I would like to have INR at least 1.8 before going home. He will need INR checked early next week if he goes home this weekend.

## 2014-12-01 NOTE — Discharge Summary (Signed)
[address]       [place] [address]             [phone]              Discharge Summary  Name: Frank [name] DOB: 05/06/1966 49 y.o. MRN: [id number]   Admission Date: 11/27/2014 Discharge Date: 12/02/2014    Admitting Diagnosis: Severe bicuspid aortic valve stenosis Ascending aortic aneurysm   Discharge Diagnosis:  Severe bicuspid aortic valve stenosis Ascending aortic aneurysm Expected postoperative blood loss anemia  Past Medical History  Diagnosis Date  . Congenital heart valve abnormality     bicuspid aortic valve  . PONV (postoperative nausea and vomiting)     after a tonsillectomy..none since  . Dysrhythmia     PVC's  . Hypercholesterolemia   . GERD (gastroesophageal reflux disease)      Procedures:  BENTALL PROCEDURE (replacement of aortic valve and aortic root with reimplantation of the coronary arteries using a 21 mm [employer]. Jude mechanical valved graft)  REPLACEMENT OF ASCENDING AORTIC ANEURYSM (hemi-arch replacement using a 28 mm Hemashield graft under deep hypothermic circulatory arrest) - 11/27/2014    HPI:  The patient is a 49 y.o. male with known bicuspid aortic valve with a history of at least moderate AS that had been followed by Dr. [name] at [place] [facility]. He underwent ACL repair by Dr. [name] on 08/01/2014 and was noted to have a loud AS murmur by anesthesia. Intraop TEE showed severe AS, moderate AI and mild to moderate MR. He got through surgery uneventfully and has been recovering nicely. He had a 2D echo on 09/04/2014 which shows severe AS with a mean gradient of 59 mm Hg and a peak of 100 mm Hg. The aortic valve is bicuspid and heavily calcified with restricted leaflet motion. AVA was 0.6 cm2. There was mild MR and mild aortic root dilatation. The LVEF was 55-60% with mild LVH. He says that he was not having any symptoms prior to his knee surgery but now notes exertional fatigue, chest pressure and shortness of  breath. His wife notes that he sleeps a lot. CTA of the chest on 10/06/2014 shows fusiform aneurysmal enlargement of the ascending aorta to 4.4 cm. Cardiac cath on 10/26/2014 shows no significant coronary disease with a dilated aortic root.  The patient was referred to Dr. Cyndia Bent for consideration of surgical intervention.  He recommended proceeding with AVR and replacement of the aortic root and ascending aorta as soon as possible. His ascending aorta is 4.4 cm by CTA and with a bicuspid valve at his young age, it was felt that replacement is indicated to prevent progressive enlargement, aortic dissection, and the need for later replacement at a higher risk. He recommended using a mechanical valve at his young age and the patient understands the need for lifelong anticoagulation with Coumadin. All risks, benefits and alternatives of surgery were explained in detail, and the patient agreed to proceed.    Hospital Course:  The patient was admitted to [facility] on 11/27/2014. The patient was taken to the operating room and underwent the above procedure.    The postoperative course has generally been uneventful.  The patient did have low systolic blood pressures early on, which initially required pressors.  These were ultimately weaned and discontinued. Blood pressures have improved, and he has since been started on a low dose beta blocker, which he is tolerating well.  He was also started on Coumadin for his  mechanical valve, and INRs are trending up appropriately.  Incisions are healing well.  He has had a mild postop blood loss anemia which has not required transfusion, and has been placed on an iron supplement. He is ambulating in the halls without difficulty and tolerating a regular diet. The patient is overall doing well and is medically stable today's date for discharge home.       Recent vital signs:  Filed Vitals:   12/02/14 0523  BP: 135/81  Pulse: 95  Temp: 98.3 F (36.8 C)  Resp: 18     Recent laboratory studies:  CBC:  Recent Labs  12/01/14 0512  WBC 8.1  HGB 8.8*  HCT 26.0*  PLT 168   BMET: No results for input(s): NA, K, CL, CO2, GLUCOSE, BUN, CREATININE, CALCIUM in the last 72 hours.  PT/INR:   Recent Labs  12/02/14 0356  LABPROT 20.1*  INR 1.70*      Discharge Medications:     Medication List    STOP taking these medications        RED YEAST RICE PO      TAKE these medications        ALPRAZolam 0.5 MG tablet  Commonly known as:  XANAX  Take 1 tablet (0.5 mg total) by mouth at bedtime as needed.     ferrous [id number] C-folic acid capsule  Commonly known as:  TRINSICON / FOLTRIN  Take 1 capsule by mouth 3 (three) times daily after meals.     metoprolol tartrate 25 MG tablet  Commonly known as:  LOPRESSOR  Take 1 tablet (25 mg total) by mouth 2 (two) times daily.     NIACIN FLUSH FREE 500 MG Caps  Generic drug:  Inositol Niacinate  Take 1,000 mg by mouth daily.     omeprazole 40 MG capsule  Commonly known as:  PRILOSEC  Take 1 capsule by mouth  daily     oxyCODONE 5 MG immediate release tablet  Commonly known as:  Oxy IR/ROXICODONE  Take 1-2 tablets (5-10 mg total) by mouth every 3 (three) hours as needed for severe pain.     vitamin C 1000 MG tablet  Take 1,000 mg by mouth daily.     warfarin 5 MG tablet  Commonly known as:  COUMADIN  Take 1 tablet (5 mg total) by mouth daily. Or as directed by Coumadin Clinic         Discharge Instructions:  The patient is to refrain from driving, heavy lifting or strenuous activity.  May shower daily and clean incisions with soap and water.  May resume regular diet.   Follow Up:  Discharge Instructions    Amb Referral to Cardiac Rehabilitation    Complete by:  As directed            Follow-up Information    Follow up with [name], MD On 01/03/2015.   Specialty:  Cardiothoracic Surgery   Why:  Appointment is at 11:30   Contact information:   [address] [place] [address] [phone]       Follow up with [facility] On 12/04/2014.   Specialty:  Cardiology   Why:  Office will contact you with an appointment   Contact information:   [address], [place] [address] [phone]      Follow up with [name], MD On 12/28/2014.   Specialty:  Cardiology   Why:  Appointment is  at 9:45   Contact information:   [address] [place] [address] [phone]      The patient has been discharged on:  1.Beta Blocker: Yes [ x ]  No [ ]   If No, reason:    2.Ace Inhibitor/ARB: Yes [ ]   No [ x ]  If No, reason: No CAD or HTN   3.Statin: Yes [  ]  No [ x ]  If No, reason: No CAD   4.S[place]a [place]: Yes [  ]  No [ x ]  If No, reason: On Coumadin   COLLINS,GINA H 12/02/2014, 9:54 AM

## 2014-12-01 NOTE — Progress Notes (Signed)
Utilization review completed.  

## 2014-12-02 LAB — PROTIME-INR
INR: 1.7 — ABNORMAL HIGH (ref 0.00–1.49)
PROTHROMBIN TIME: 20.1 s — AB (ref 11.6–15.2)

## 2014-12-02 LAB — ANAEROBIC CULTURE: GRAM STAIN: NONE SEEN

## 2014-12-02 MED ORDER — METOPROLOL TARTRATE 25 MG PO TABS: 25.0000 mg | ORAL_TABLET | Freq: Two times a day (BID) | ORAL | Status: DC

## 2014-12-02 MED ORDER — METOPROLOL TARTRATE 25 MG PO TABS
25.0000 mg | ORAL_TABLET | Freq: Two times a day (BID) | ORAL | Status: DC
  Administered 2014-12-02: 25 mg via ORAL
  Filled 2014-12-02 (×2): qty 1

## 2014-12-02 MED ORDER — OXYCODONE HCL 5 MG PO TABS: 5.0000 mg | ORAL_TABLET | ORAL | Status: DC | PRN

## 2014-12-02 MED ORDER — WARFARIN SODIUM 5 MG PO TABS: 5.0000 mg | ORAL_TABLET | Freq: Every day | ORAL | Status: DC

## 2014-12-02 MED ORDER — FE FUMARATE-B12-VIT C-FA-IFC PO CAPS: 1.0000 | ORAL_CAPSULE | Freq: Three times a day (TID) | ORAL | Status: DC

## 2014-12-02 MED ORDER — WARFARIN SODIUM 5 MG PO TABS
5.0000 mg | ORAL_TABLET | Freq: Once | ORAL | Status: AC
  Administered 2014-12-02: 5 mg via ORAL
  Filled 2014-12-02: qty 1

## 2014-12-02 NOTE — Progress Notes (Addendum)
       [address]       [place] [address]             [phone]          5 Days Post-Op Procedure(s) (LRB): BENTALL PROCEDURE (N/A) TRANSESOPHAGEAL ECHOCARDIOGRAM (TEE) (N/A)  Subjective: Feels well, no complaints.   Objective: Vital signs in last 24 hours: Patient Vitals for the past 24 hrs:  BP Temp Temp src Pulse Resp SpO2 Weight  12/02/14 0523 135/81 mmHg 98.3 F (36.8 C) Oral 95 18 99 % 176 lb 2.4 oz (79.9 kg)  12/01/14 2123 132/70 mmHg 98.7 F (37.1 C) Oral (!) 106 18 97 % -  12/01/14 1414 117/73 mmHg 98.2 F (36.8 C) Oral 84 19 97 % -  12/01/14 0935 112/80 mmHg - - 89 - - -  12/01/14 0920 124/73 mmHg - - 92 - - -   Current Weight  12/02/14 176 lb 2.4 oz (79.9 kg)     Intake/Output from previous day: 04/15 0701 - 04/16 0700 In: 720 [P.O.:720] Out: 2675 [Urine:2675]    PHYSICAL EXAM:  Heart: RRR, mildly tachy Lungs: Clear Wound: Clean and dry Extremities: Trace LE edema    Lab Results: CBC: Recent Labs  12/01/14 0512  WBC 8.1  HGB 8.8*  HCT 26.0*  PLT 168   BMET: No results for input(s): NA, K, CL, CO2, GLUCOSE, BUN, CREATININE, CALCIUM in the last 72 hours.  PT/INR:  Recent Labs  12/02/14 0356  LABPROT 20.1*  INR 1.70*      Assessment/Plan: S/P Procedure(s) (LRB): BENTALL PROCEDURE (N/A) TRANSESOPHAGEAL ECHOCARDIOGRAM (TEE) (N/A)  CV- BPs trending up, having episodes of ST, especially with exertion.  Will increase Lopressor and watch.  INR slowly trending up, now at 1.7.  Continue present Coumadin dose for mechanical valve.  Expected postop blood loss anemia- H/H stable, continue to watch.  Discussed with MD- since INR is trending up and pt is otherwise stable, will plan d/c home today and have INR checked on Monday. Instructions reviewed with pt and family.   LOS: 5 days    Chanee Henrickson H 12/02/2014

## 2014-12-02 NOTE — Progress Notes (Addendum)
Nursing note 5110 Pt given discharge instructions, AVS, medication list and personal paper prescriptions given to patient. Reviewed follow up appointments with patient. All questions were answered will discharge home as ordered. Latoia Eyster, Bettina Gavia RN

## 2014-12-04 ENCOUNTER — Ambulatory Visit (INDEPENDENT_AMBULATORY_CARE_PROVIDER_SITE_OTHER): Payer: 59 | Admitting: *Deleted

## 2014-12-04 DIAGNOSIS — Z5181 Encounter for therapeutic drug level monitoring: Secondary | ICD-10-CM

## 2014-12-04 DIAGNOSIS — I350 Nonrheumatic aortic (valve) stenosis: Secondary | ICD-10-CM

## 2014-12-04 DIAGNOSIS — Z95828 Presence of other vascular implants and grafts: Secondary | ICD-10-CM

## 2014-12-04 LAB — POCT INR: INR: 3.9

## 2014-12-04 NOTE — Patient Instructions (Signed)

## 2014-12-11 ENCOUNTER — Ambulatory Visit (INDEPENDENT_AMBULATORY_CARE_PROVIDER_SITE_OTHER): Payer: Self-pay

## 2014-12-11 ENCOUNTER — Ambulatory Visit (INDEPENDENT_AMBULATORY_CARE_PROVIDER_SITE_OTHER): Payer: 59 | Admitting: Pharmacist Clinician (PhC)/ Clinical Pharmacy Specialist

## 2014-12-11 DIAGNOSIS — I350 Nonrheumatic aortic (valve) stenosis: Secondary | ICD-10-CM

## 2014-12-11 DIAGNOSIS — Z5181 Encounter for therapeutic drug level monitoring: Secondary | ICD-10-CM | POA: Diagnosis not present

## 2014-12-11 DIAGNOSIS — Z4802 Encounter for removal of sutures: Secondary | ICD-10-CM

## 2014-12-11 DIAGNOSIS — Z95828 Presence of other vascular implants and grafts: Secondary | ICD-10-CM | POA: Diagnosis not present

## 2014-12-11 LAB — POCT INR: INR: 3.1

## 2014-12-11 NOTE — Progress Notes (Signed)
Removed 2 sutures from chest tube sites with no signs of infection and patient tolerated well. 

## 2014-12-18 ENCOUNTER — Ambulatory Visit (INDEPENDENT_AMBULATORY_CARE_PROVIDER_SITE_OTHER): Payer: 59 | Admitting: Pharmacist Clinician (PhC)/ Clinical Pharmacy Specialist

## 2014-12-18 DIAGNOSIS — Z95828 Presence of other vascular implants and grafts: Secondary | ICD-10-CM | POA: Diagnosis not present

## 2014-12-18 DIAGNOSIS — I350 Nonrheumatic aortic (valve) stenosis: Secondary | ICD-10-CM

## 2014-12-18 DIAGNOSIS — Z5181 Encounter for therapeutic drug level monitoring: Secondary | ICD-10-CM | POA: Diagnosis not present

## 2014-12-18 LAB — POCT INR: INR: 4.5

## 2014-12-22 ENCOUNTER — Telehealth: Payer: Self-pay | Admitting: *Deleted

## 2014-12-22 NOTE — Telephone Encounter (Signed)
Set up for CR

## 2014-12-25 ENCOUNTER — Ambulatory Visit (INDEPENDENT_AMBULATORY_CARE_PROVIDER_SITE_OTHER): Payer: 59 | Admitting: Pharmacist Clinician (PhC)/ Clinical Pharmacy Specialist

## 2014-12-25 DIAGNOSIS — Z5181 Encounter for therapeutic drug level monitoring: Secondary | ICD-10-CM | POA: Diagnosis not present

## 2014-12-25 DIAGNOSIS — Z95828 Presence of other vascular implants and grafts: Secondary | ICD-10-CM

## 2014-12-25 DIAGNOSIS — I350 Nonrheumatic aortic (valve) stenosis: Secondary | ICD-10-CM

## 2014-12-25 LAB — POCT INR: INR: 2.2

## 2014-12-26 ENCOUNTER — Encounter: Payer: Self-pay | Admitting: *Deleted

## 2014-12-26 ENCOUNTER — Encounter: Payer: 59 | Attending: Cardiovascular Disease | Admitting: *Deleted

## 2014-12-26 VITALS — Ht 69.75 in | Wt 175.0 lb

## 2014-12-26 DIAGNOSIS — Z95828 Presence of other vascular implants and grafts: Secondary | ICD-10-CM | POA: Diagnosis not present

## 2014-12-26 DIAGNOSIS — Z952 Presence of prosthetic heart valve: Secondary | ICD-10-CM

## 2014-12-26 NOTE — Patient Instructions (Signed)
Patient Instructions  Patient Details  Name: Frank [name] MRN: [id number] Date of Birth: [birth year] Referring Provider:  Sanda Klein, MD  Below are the personal goals you chose as well as exercise and nutrition goals. Our goal is to help you keep on track towards obtaining and maintaining your goals. We will be discussing your progress on these goals with you throughout the program.  Initial Exercise Prescription:     Initial Exercise Prescription - 12/26/14 0900    Treadmill   MPH 3   Grade 0   Minutes 15   Bike   Level 0.4   Minutes 10   Recumbant Bike   Level 4   Watts 35   Minutes 15   NuStep   Level 3   Watts 45   Minutes 15   Arm Ergometer   Level 1   Watts 8   Minutes 10   Arm/Foot Ergometer   Level 4   Watts 15   Minutes 10   Cybex   Level 3   RPM 60   Minutes 10   Recumbant Elliptical   Level 1   RPM 40   Watts 10   Minutes 15   Elliptical   Level 1   Speed 3   Minutes 1   REL-XR   Level 3   Watts 40   Minutes 15   Prescription Details   Frequency (times per week) 3   Duration Progress to 30 minutes of continuous aerobic without signs/symptoms of physical distress   Intensity   THHR 40-80% of Max Heartrate 106-151   Ratings of Perceived Exertion 11-13   Progression Continue progressive overload as per policy without signs/symptoms or physical distress.   Resistance Training   Training Prescription Yes   Weight 3   Reps 10-12      Exercise Goals: Frequency: Be able to perform aerobic exercise three times per week working toward 3-5 days per week.  Intensity: Work with a perceived exertion of 11 (fairly light) - 15 (hard) as tolerated. Follow your new exercise prescription and watch for changes in prescription as you progress with the program. Changes will be reviewed with you when they are made.  Duration: You should be able to do 30 minutes of continuous aerobic exercise in addition to a 5 minute warm-up and a 5 minute cool-down  routine.  Nutrition Goals: Your personal nutrition goals will be established when you do your nutrition analysis with the dietician.  The following are nutrition guidelines to follow: Cholesterol < 200mg /day Sodium < 1500mg /day Fiber: Men under 50 yrs - 38 grams per day  Personal Goals:     Personal Goals and Risk Factors at Admission - 12/26/14 0838    Personal Goals and Risk Factors on Admission   Increase Aerobic Exercise and Physical Activity Yes   Intervention While in program, learn and follow the exercise prescription taught. Start at a low level workload and increase workload after able to maintain previous level for 30 minutes. Increase time before increasing intensity.   Diabetes No   Hypertension No   Lipids Yes   Goal Cholesterol controlled with medications as prescribed, with individualized exercise RX and with personalized nutrition plan. Value goals: LDL < 70mg , HDL > 40mg . Participant states understanding of desired cholesterol values and following prescriptions.   Intervention Provide nutrition & aerobic exercise along with prescribed medications to achieve LDL 70mg , HDL >40mg .   Stress No      Tobacco Use Initial Evaluation: History  Smoking status  . Never Smoker   Smokeless tobacco  . Never Used    Copy of goals given to participant.

## 2014-12-26 NOTE — Progress Notes (Signed)
Cardiac Individual Treatment Plan  Patient Details  Name: Frank [name] MRN: [id number] Date of Birth: 22-Feb-1966 Referring Provider:  Sanda Klein, MD  Initial Encounter Date:  12/26/2014  Patient's Home Medications on Admission:  Current outpatient prescriptions:  .  ALPRAZolam (XANAX) 0.5 MG tablet, Take 1 tablet (0.5 mg total) by mouth at bedtime as needed., Disp: 90 tablet, Rfl: 0 .  Ascorbic Acid (VITAMIN C) 1000 MG tablet, Take 1,000 mg by mouth daily., Disp: , Rfl:  .  ferrous [id number] C-folic acid (TRINSICON / FOLTRIN) capsule, Take 1 capsule by mouth 3 (three) times daily after meals., Disp: 90 capsule, Rfl: 0 .  Inositol Niacinate (NIACIN FLUSH FREE) 500 MG CAPS, Take 1,000 mg by mouth daily. , Disp: , Rfl:  .  metoprolol tartrate (LOPRESSOR) 25 MG tablet, Take 1 tablet (25 mg total) by mouth 2 (two) times daily., Disp: 60 tablet, Rfl: 1 .  omeprazole (PRILOSEC) 40 MG capsule, Take 1 capsule by mouth  daily, Disp: 90 capsule, Rfl: 1 .  warfarin (COUMADIN) 5 MG tablet, Take 1 tablet (5 mg total) by mouth daily. Or as directed by Coumadin Clinic, Disp: 40 tablet, Rfl: 1  Past Medical History: Past Medical History  Diagnosis Date  . Congenital heart valve abnormality     bicuspid aortic valve  . PONV (postoperative nausea and vomiting)     after a tonsillectomy..none since  . Dysrhythmia     PVC's  . Hypercholesterolemia   . GERD (gastroesophageal reflux disease)     Tobacco Use: History  Smoking status  . Never Smoker   Smokeless tobacco  . Never Used    Labs:    Exercise Target Goals:    Exercise Program Goal: Individual exercise prescription set with THRR, safety & activity barriers. Participant demonstrates ability to understand and report RPE using BORG scale, to self-measure pulse accurately, and to acknowledge the importance of the exercise prescription.  Exercise Prescription Goal: Starting with aerobic activity 30 plus minutes a  day, 3 days per week for initial exercise prescription. Provide home exercise prescription and guidelines that participant acknowledges understanding prior to discharge.  Activity Barriers & Risk Stratification:     Activity Barriers & Risk Stratification - 12/26/14 0830    Activity Barriers & Risk Stratification   Activity Barriers None   Risk Stratification High      6 Minute Walk:     6 Minute Walk      12/26/14 0917       6 Minute Walk   Phase Initial     Distance 1750 feet     Walk Time 6 minutes     Resting HR 61 bpm     Resting BP 136/80 mmHg     Max Ex. HR 112 bpm     Max Ex. BP 146/80 mmHg     RPE 12     Symptoms No        Initial Exercise Prescription:     Initial Exercise Prescription - 12/26/14 0900    Treadmill   MPH 3   Grade 0   Minutes 15   Bike   Level 0.4   Minutes 10   Recumbant Bike   Level 4   Watts 35   Minutes 15   NuStep   Level 3   Watts 45   Minutes 15   Arm Ergometer   Level 1   Watts 8   Minutes 10   Arm/Foot Ergometer   Level 4  Watts 15   Minutes 10   Cybex   Level 3   RPM 60   Minutes 10   Recumbant Elliptical   Level 1   RPM 40   Watts 10   Minutes 15   Elliptical   Level 1   Speed 3   Minutes 1   REL-XR   Level 3   Watts 40   Minutes 15   Prescription Details   Frequency (times per week) 3   Duration Progress to 30 minutes of continuous aerobic without signs/symptoms of physical distress   Intensity   THHR 40-80% of Max Heartrate 106-151   Ratings of Perceived Exertion 11-13   Progression Continue progressive overload as per policy without signs/symptoms or physical distress.   Resistance Training   Training Prescription Yes   Weight 3   Reps 10-12      Exercise Prescription Changes:   Discharge Exercise Prescription:   Nutrition:  Target Goals: Understanding of nutrition guidelines, daily intake of sodium 1500mg , cholesterol 200mg , calories 30% from fat and 7% or less from saturated  fats, daily to have 5 or more servings of fruits and vegetables.  Biometrics:     Pre Biometrics - 12/26/14 1004    Pre Biometrics   Height 5' 9.75" (1.772 m)   Weight 175 lb (79.379 kg)   Waist Circumference 35 inches   Hip Circumference 37.5 inches   Waist to Hip Ratio 0.93 %   BMI (Calculated) 25.3       Nutrition Therapy Plan and Nutrition Goals:   Nutrition Discharge: Rate Your Plate Scores:   Nutrition Goals Re-Evaluation:   Psychosocial: Target Goals: Acknowledge presence or absence of depression, maximize coping skills, provide positive support system. Participant is able to verbalize types and ability to use techniques and skills needed for reducing stress and depression.  Initial Review & Psychosocial Screening:     Initial Psych Review & Screening - 12/26/14 [place]? Yes   Barriers   Psychosocial barriers to participate in program There are no identifiable barriers or psychosocial needs.;The patient should benefit from training in stress management and relaxation.   Screening Interventions   Interventions Encouraged to exercise      Quality of Life Scores:   PHQ-9:     Recent Review Flowsheet Data    Depression screen [facility] 2/9 12/26/2014   Decreased Interest 0   Down, Depressed, Hopeless 0   PHQ - 2 Score 0   Altered sleeping 0   Tired, decreased energy 0   Change in appetite 0   Feeling bad or failure about yourself  0   Trouble concentrating 0   Moving slowly or fidgety/restless 0   Suicidal thoughts 0   PHQ-9 Score 0      Psychosocial Evaluation and Intervention:   Psychosocial Re-Evaluation:   Vocational Rehabilitation: Provide vocational rehab assistance to qualifying candidates.   Vocational Rehab Evaluation & Intervention:     Vocational Rehab - 12/26/14 2353    Initial Vocational Rehab Evaluation & Intervention   Assessment shows need for Vocational Rehabilitation No       Education: Education Goals: Education classes will be provided on a weekly basis, covering required topics. Participant will state understanding/return demonstration of topics presented.  Learning Barriers/Preferences:     Learning Barriers/Preferences - 12/26/14 0831    Learning Barriers/Preferences   Learning Barriers None   Learning Preferences Written Material      Education  Topics: General Nutrition Guidelines/Fats and Fiber: -Group instruction provided by verbal, written material, models and posters to present the general guidelines for heart healthy nutrition. Gives an explanation and review of dietary fats and fiber.   Controlling Sodium/Reading Food Labels: -Group verbal and written material supporting the discussion of sodium use in heart healthy nutrition. Review and explanation with models, verbal and written materials for utilization of the food label.   Exercise Physiology & Risk Factors: - Group verbal and written instruction with models to review the exercise physiology of the cardiovascular system and associated critical values. Details cardiovascular disease risk factors and the goals associated with each risk factor.   Aerobic Exercise & Resistance Training: - Gives group verbal and written discussion on the health impact of inactivity. On the components of aerobic and resistive training programs and the benefits of this training and how to safely progress through these programs.   Flexibility, Balance, General Exercise Guidelines: - Provides group verbal and written instruction on the benefits of flexibility and balance training programs. Provides general exercise guidelines with specific guidelines to those with heart or lung disease. Demonstration and skill practice provided.   Stress Management: - Provides group verbal and written instruction about the health risks of elevated stress, cause of high stress, and healthy ways to reduce  stress.   Depression: - Provides group verbal and written instruction on the correlation between heart/lung disease and depressed mood, treatment options, and the stigmas associated with seeking treatment.   Anatomy & Physiology of the Heart: - Group verbal and written instruction and models provide basic cardiac anatomy and physiology, with the coronary electrical and arterial systems. Review of: AMI, Angina, Valve disease, Heart Failure, Cardiac Arrhythmia, Pacemakers, and the ICD.   Cardiac Procedures: - Group verbal and written instruction and models to describe the testing methods done to diagnose heart disease. Reviews the outcomes of the test results. Describes the treatment choices: Medical Management, Angioplasty, or Coronary Bypass Surgery.   Cardiac Medications: - Group verbal and written instruction to review commonly prescribed medications for heart disease. Reviews the medication, class of the drug, and side effects. Includes the steps to properly store meds and maintain the prescription regimen.   Go Sex-Intimacy & Heart Disease, Get SMART - Goal Setting: - Group verbal and written instruction through game format to discuss heart disease and the return to sexual intimacy. Provides group verbal and written material to discuss and apply goal setting through the application of the S.M.A.R.T. Method.   Other Matters of the Heart: - Provides group verbal, written materials and models to describe Heart Failure, Angina, Valve Disease, and Diabetes in the realm of heart disease. Includes description of the disease process and treatment options available to the cardiac patient.   Exercise & Equipment Safety: - Individual verbal instruction and demonstration of equipment use and safety with use of the equipment.          Cardiac Rehab from 12/26/2014 in [facility]   Date  12/26/14   Educator  S Bice   Instruction Review Code  2- meets goals/outcomes      Infection  Prevention: - Provides verbal and written material to individual with discussion of infection control including proper hand washing and proper equipment cleaning during exercise session.      Cardiac Rehab from 12/26/2014 in [facility]iac [facility]   Date  12/26/14   Educator  S Bice   Instruction Review Code  2- meets goals/outcomes  Falls Prevention: - Provides verbal and written material to individual with discussion of falls prevention and safety.      Cardiac Rehab from 12/26/2014 in [facility] Cardiac Rehab   Date  12/26/14   Educator  S Bice   Instruction Review Code  2- meets goals/outcomes      Diabetes: - Individual verbal and written instruction to review signs/symptoms of diabetes, desired ranges of glucose level fasting, after meals and with exercise. Advice that pre and post exercise glucose checks will be done for 3 sessions at entry of program.    Knowledge Questionnaire Score:   Personal Goals and Risk Factors at Admission:     Personal Goals and Risk Factors at Admission - 12/26/14 0838    Personal Goals and Risk Factors on Admission   Increase Aerobic Exercise and Physical Activity Yes   Intervention While in program, learn and follow the exercise prescription taught. Start at a low level workload and increase workload after able to maintain previous level for 30 minutes. Increase time before increasing intensity.   Diabetes No   Hypertension No   Lipids Yes   Goal Cholesterol controlled with medications as prescribed, with individualized exercise RX and with personalized nutrition plan. Value goals: LDL < 70mg , HDL > 40mg . Participant states understanding of desired cholesterol values and following prescriptions.   Intervention Provide nutrition & aerobic exercise along with prescribed medications to achieve LDL 70mg , HDL >40mg .   Stress No      Personal Goals and Risk Factors Review:    Personal Goals Discharge:     Comments: Initial review completed.

## 2014-12-28 ENCOUNTER — Ambulatory Visit (INDEPENDENT_AMBULATORY_CARE_PROVIDER_SITE_OTHER): Payer: 59 | Admitting: Pharmacist Clinician (PhC)/ Clinical Pharmacy Specialist

## 2014-12-28 ENCOUNTER — Encounter: Payer: Self-pay | Admitting: Internal Medicine

## 2014-12-28 ENCOUNTER — Ambulatory Visit (INDEPENDENT_AMBULATORY_CARE_PROVIDER_SITE_OTHER): Payer: 59 | Admitting: Internal Medicine

## 2014-12-28 DIAGNOSIS — Z5181 Encounter for therapeutic drug level monitoring: Secondary | ICD-10-CM | POA: Diagnosis not present

## 2014-12-28 DIAGNOSIS — Z8774 Personal history of (corrected) congenital malformations of heart and circulatory system: Secondary | ICD-10-CM | POA: Diagnosis not present

## 2014-12-28 DIAGNOSIS — I350 Nonrheumatic aortic (valve) stenosis: Secondary | ICD-10-CM

## 2014-12-28 DIAGNOSIS — E785 Hyperlipidemia, unspecified: Secondary | ICD-10-CM

## 2014-12-28 DIAGNOSIS — Z95828 Presence of other vascular implants and grafts: Secondary | ICD-10-CM

## 2014-12-28 DIAGNOSIS — Z954 Presence of other heart-valve replacement: Secondary | ICD-10-CM | POA: Insufficient documentation

## 2014-12-28 DIAGNOSIS — Z79899 Other long term (current) drug therapy: Secondary | ICD-10-CM | POA: Diagnosis not present

## 2014-12-28 DIAGNOSIS — D649 Anemia, unspecified: Secondary | ICD-10-CM | POA: Diagnosis not present

## 2014-12-28 LAB — POCT INR: INR: 2.4

## 2014-12-28 MED ORDER — METOPROLOL TARTRATE 25 MG PO TABS: 25.0000 mg | ORAL_TABLET | Freq: Two times a day (BID) | ORAL | Status: DC

## 2014-12-28 NOTE — Progress Notes (Signed)
OFFICE NOTE  Chief Complaint:  Hospital follow-up  Primary Care Physician: [name], MD  HPI:  Frank [name] is a pleasant 49 yo male with a history of at least moderate AS (followed by Dr. [name] at the [facility]), presented for ACL repair with Dr. [name]. Preoperatively, he was evaluated by Dr. [name] (cardiac anesthesia) and found to have a loud AS murmur- After some discussion, interoperative TEE was recommended and performed. I personally reviewed the TEE images which do demonstrate severe aortic stenosis, moderate AI and mild to moderate MR (obtained during surgery, on pressor, PPV, etc, but still significant). He was recently told he probably had another 5 years before needing to consider surgery. Mean gradient is already above 40 mmHg. Clinically, he has been asymptomatic (no chest pain, dyspnea, syncope, etc,) but recently he has had more palpitations (PVC's) and was started on b-blocker. He did manage to get through surgery uneventfully.   He returns today in follow-up to the office. He continues to deny any chest pain, worsening shortness of breath, presyncope or syncopal episodes. He has not pushed himself significantly due to his recent knee surgery. He is undergoing rehabilitation and getting stronger. A repeat echocardiogram was just performed as an outpatient. This was initially read by one of my partners and indicated mild to moderate aortic stenosis based on a mean valve gradient in the 20s. However subsequent images during the study do indicate mean gradients greater than 50 from the right upper sternal border using Pedoff probe. I therefore personally reviewed the echocardiogram images and made the appropriate corrections,  and the results are as follows:  Study Conclusions  - Left ventricle: The cavity size was normal. Wall thickness was increased in a pattern of mild LVH. Systolic function was normal. The estimated ejection fraction was in the range  of 55% to 60%. - Aortic valve: The aortic valve is bicuspid and is heavily calcified with restricted leaflet motion. Peak and mean gradients through the valve are 100 and 59 mm Hg respectively, consistent with severe AS. The calculated AVA is 0.6 cm2. There is moderate, eccentric AI. There was mild regurgitation. - Aorta: Aortic root dimension: 39 mm (ED). - Aortic root: The aortic root is mildly dilated. - Mitral valve: Calcified annulus. Mildly thickened leaflets . There was mild regurgitation. - Left atrium: LA Volume/BSA= 46.4 ml/m2. The atrium is moderately dilated.  Impressions:  - LVEF 60-65%, moderate concentric LVH, bicuspid and severely stenotic aortic valve- AVA 0.6 cm2, peak and mean gradients of 100 mmHg and 59 mmHg, respectively - LVOT diameter measured at 2.4 cm. There is moderate AI. There is mild to moderate MR with restricted anterior leaflet motion secondary to eccentric AI.  Pedoff measurements do indeed show a very high mean gradient of 59 and peak gradient of 100 mmHg. Based on a generous LVOT diameter of 2.4 cm, the calculated aortic valve gradient was 0.6 cm consistent with severe aortic stenosis.  I saw [name] back in the office today. He was recently on a trip and noticed increasing shortness of breath and easy fatigue with exercise which was a new finding for him. He feels that this is symptomatic aortic stenosis and I would agree. I discussed the case further with Dr. Cyndia Bent, the cardiac thoracic surgeon that he's been seeing, and we feel that it's probably the time now to entertain valve replacement. Based on that there is also concern about aortopathy given his bicuspid valve. He underwent a CT scan  of the aorta and coronaries which showed does reveal multivessel coronary artery calcification as well as calcium deposits in the left main coronary. The aortic valve is heavily calcified and restricted in motion. There was at least mild  cardiac hypertrophy and dilatation of the left ventricle. The ascending aorta is dilated measuring 4.3-4.4 cm. There is no evidence for coarctation or enlargement of the thoracic or descending aorta.  [name] returns today for follow-up. He successfully underwent Bentall procedure with hemi-arch replacement using a 28 mm Hemashield graft under deep hypothermic circulatory arrest and Bentall Procedure ( replacement of aortic valve and aortic root with reimplantation of the coronary arteries) using a 21 mm St. Jude mechanical valve graft.  Overall he is doing exceedingly well. He continues to do exercise. He is also recovering from recent knee surgery. He denies any chest pain or worsening shortness of breath. His energy levels improved significantly. His median sternotomy is healing well. He did have some postoperative anemia however this seems to have improved and his energy level is better.  PMHx:  Past Medical History  Diagnosis Date  . Congenital heart valve abnormality     bicuspid aortic valve  . PONV (postoperative nausea and vomiting)     after a tonsillectomy..none since  . Dysrhythmia     PVC's  . Hypercholesterolemia   . GERD (gastroesophageal reflux disease)     Past Surgical History  Procedure Laterality Date  . Inguinal hernia repair  2006    left, Dr. Bary Castilla  . Tonsillectomy  2001  . Anterior cruciate ligament repair Right 08/01/2014    Procedure: RECONSTRUCTION ANTERIOR CRUCIATE LIGAMENT (ACL) WITH HAMSTRING GRAFT, MENISCAL DEBRIDEMENT AS NEEDED.;  Surgeon: Meredith Pel, MD;  Location: [facility];  Service: Orthopedics;  Laterality: Right;  . Tee without cardioversion N/A 08/01/2014    Procedure: TRANSESOPHAGEAL ECHOCARDIOGRAM (TEE);  Surgeon: Meredith Pel, MD;  Location: [facility];  Service: Orthopedics;  Laterality: N/A;  . Coronary angiogram  10/26/2014    Procedure: CORONARY ANGIOGRAM;  Surgeon: [name], MD;  Location: [facility] CATH LAB;  Service:  Cardiovascular;;  . Cardiac catheterization      10/26/2014  . Bentall procedure N/A 11/27/2014    Procedure: BENTALL PROCEDURE;  Surgeon: [name], MD;  Location: [facility];  Service: Open Heart Surgery;  Laterality: N/A;  WITH CIRC ARREST  . Tee without cardioversion N/A 11/27/2014    Procedure: TRANSESOPHAGEAL ECHOCARDIOGRAM (TEE);  Surgeon: [name], MD;  Location: [facility];  Service: Open Heart Surgery;  Laterality: N/A;    FAMHx:  Family History  Problem Relation Age of Onset  . Arthritis Mother     psoriatis, Crohn's   . Heart disease Father     CAD in his early 79s    SOCHx:   reports that he has never smoked. He has never used smokeless tobacco. He reports that he drinks about 4.2 oz of alcohol per week. He reports that he does not use illicit drugs.  ALLERGIES:  Allergies  Allergen Reactions  . Statins Other (See Comments)    Muscle ache    ROS: A comprehensive review of systems was negative.  HOME MEDS: Current Outpatient Prescriptions  Medication Sig Dispense Refill  . ALPRAZolam (XANAX) 0.5 MG tablet Take 1 tablet (0.5 mg total) by mouth at bedtime as needed. 90 tablet 0  . Ascorbic Acid (VITAMIN C) 1000 MG tablet Take 1,000 mg by mouth daily.    . ferrous [id number] C-folic acid (TRINSICON /  FOLTRIN) capsule Take 1 capsule by mouth 3 (three) times daily after meals. 90 capsule 0  . Inositol Niacinate (NIACIN FLUSH FREE) 500 MG CAPS Take 1,000 mg by mouth daily.     . metoprolol tartrate (LOPRESSOR) 25 MG tablet Take 1 tablet (25 mg total) by mouth 2 (two) times daily. 60 tablet 1  . omeprazole (PRILOSEC) 40 MG capsule Take 1 capsule by mouth  daily 90 capsule 1  . warfarin (COUMADIN) 5 MG tablet Take 1 tablet (5 mg total) by mouth daily. Or as directed by [facility] 40 tablet 1   No current facility-administered medications for this visit.    LABS/IMAGING: No results found for this or any previous visit (from the past 48 hour(s)). No  results found.  VITALS: BP 132/80 mmHg  Pulse 68  Ht 5' 9.5" (1.765 m)  Wt 174 lb 4.8 oz (79.062 kg)  BMI 25.38 kg/m2  EXAM: General appearance: alert and no distress Neck: no carotid bruit and no JVD Lungs: clear to auscultation bilaterally Heart: regular rate and rhythm, S1, S2 normal, no murmur, click, rub or gallop and Sharp mechanical valve sounds Abdomen: soft, non-tender; bowel sounds normal; no masses,  no organomegaly Extremities: extremities normal, atraumatic, no cyanosis or edema Pulses: 2+ and symmetric Skin: Skin color, texture, turgor normal. No rashes or lesions Neurologic: Grossly normal Psych: Pleasant  EKG: deferred  ASSESSMENT: 1. Severe calcific aortic stenosis of a bicuspid valve-s/p 21 mm St. Jude AVR 2. Mild to moderate mitral regurgitation 3. Preserved LVEF of 60-65% 4. Moderate left atrial enlargement 5. Multivessel and left main coronary calcium 6. Thoracic aortic aneurysm - s/p Hemashield grafting/hemiarch replacement 7. Dyslipidemia  PLAN: 1.   Mr. Vences is is recovering nicely from his ascending aortic aneurysm and aortic valve replacement surgeries. He has marked improvement in energy and exercise ability. His sternal wounds are healing nicely. He started his own rehabilitation and massive be doing more metabolic equivalents and he would do at cardiac rehabilitation. He'll have to reassess whether or not that would be of benefit to him. I've encouraged him to continue with exercise. We've checked his INR today and it does seem to a stabilized. He is due for recheck of his cholesterol. He is currently on niacin which is unlikely to be beneficial. He says in the past she's been on simvastatin and had some myalgias, but is wondering if this is related to his knee problem and/or aortic stenosis. We'll go ahead and check a cholesterol profile today and treat that accordingly.   Plan to see him back in 6 months. He has a follow-up appointment with Dr.  Cyndia Bent next week.   [name], MD, [facility] Attending Cardiologist [place] 12/28/2014, 10:16 AM

## 2014-12-28 NOTE — Patient Instructions (Signed)
Your physician recommends that you return for lab work at your earliest [place].  Dr Debara Pickett recommends that you schedule a follow-up appointment in 6 months. You will receive a reminder letter in the mail two months in advance. If you don't receive a letter, please call our office to schedule the follow-up appointment.

## 2014-12-31 LAB — COMPREHENSIVE METABOLIC PANEL
ALT: 21 IU/L (ref 0–44)
AST: 22 IU/L (ref 0–40)
Albumin/Globulin Ratio: 1.6 (ref 1.1–2.5)
Albumin: 4.6 g/dL (ref 3.5–5.5)
Alkaline Phosphatase: 83 IU/L (ref 39–117)
BILIRUBIN TOTAL: 0.3 mg/dL (ref 0.0–1.2)
BUN/Creatinine Ratio: 16 (ref 9–20)
BUN: 18 mg/dL (ref 6–24)
CO2: 23 mmol/L (ref 18–29)
Calcium: 9.9 mg/dL (ref 8.7–10.2)
Chloride: 99 mmol/L (ref 97–108)
Creatinine, Ser: 1.16 mg/dL (ref 0.76–1.27)
GFR calc Af Amer: 85 mL/min/{1.73_m2} (ref >59)
GFR calc non Af Amer: 74 mL/min/{1.73_m2} (ref >59)
GLOBULIN, TOTAL: 2.8 g/dL (ref 1.5–4.5)
GLUCOSE: 91 mg/dL (ref 65–99)
POTASSIUM: 4.8 mmol/L (ref 3.5–5.2)
Sodium: 139 mmol/L (ref 134–144)
Total Protein: 7.4 g/dL (ref 6.0–8.5)

## 2014-12-31 LAB — CBC
HEMOGLOBIN: 13.5 g/dL (ref 12.6–17.7)
Hematocrit: 40.6 % (ref 37.5–51.0)
MCH: 28.5 pg (ref 26.6–33.0)
MCHC: 33.3 g/dL (ref 31.5–35.7)
MCV: 86 fL (ref 79–97)
Platelets: 283 10*3/uL (ref 150–379)
RBC: 4.73 x10E6/uL (ref 4.14–5.80)
RDW: 16.1 % — ABNORMAL HIGH (ref 12.3–15.4)
WBC: 7.3 10*3/uL (ref 3.4–10.8)

## 2014-12-31 LAB — NMR, LIPOPROFILE
Cholesterol: 279 mg/dL — ABNORMAL HIGH (ref 100–199)
HDL Cholesterol by NMR: 38 mg/dL — ABNORMAL LOW (ref >39)
HDL PARTICLE NUMBER: 30.8 umol/L (ref >=30.5)
LDL Particle Number: 2055 nmol/L — ABNORMAL HIGH (ref <1000)
LDL Size: 20 nm (ref >20.5)
LDL-C: 181 mg/dL — AB (ref 0–99)
LP-IR Score: 60 — ABNORMAL HIGH (ref <=45)
SMALL LDL PARTICLE NUMBER: 1401 nmol/L — AB (ref <=527)
Triglycerides by NMR: 301 mg/dL — ABNORMAL HIGH (ref 0–149)

## 2015-01-01 ENCOUNTER — Ambulatory Visit: Payer: 59

## 2015-01-01 ENCOUNTER — Other Ambulatory Visit: Payer: Self-pay | Admitting: *Deleted

## 2015-01-01 ENCOUNTER — Encounter: Payer: Self-pay | Admitting: Internal Medicine

## 2015-01-01 DIAGNOSIS — E785 Hyperlipidemia, unspecified: Secondary | ICD-10-CM

## 2015-01-02 ENCOUNTER — Other Ambulatory Visit: Payer: Self-pay | Admitting: Surgery

## 2015-01-02 DIAGNOSIS — Z95828 Presence of other vascular implants and grafts: Secondary | ICD-10-CM

## 2015-01-02 MED ORDER — CRESTOR 20 MG PO TABS: 20.0000 mg | ORAL_TABLET | Freq: Every day | ORAL | Status: DC

## 2015-01-03 ENCOUNTER — Other Ambulatory Visit: Payer: Self-pay | Admitting: *Deleted

## 2015-01-03 ENCOUNTER — Other Ambulatory Visit: Payer: Self-pay | Admitting: Pharmacist Clinician (PhC)/ Clinical Pharmacy Specialist

## 2015-01-03 ENCOUNTER — Ambulatory Visit (INDEPENDENT_AMBULATORY_CARE_PROVIDER_SITE_OTHER): Payer: Self-pay | Admitting: Surgery

## 2015-01-03 ENCOUNTER — Ambulatory Visit
Admission: RE | Admit: 2015-01-03 | Discharge: 2015-01-03 | Disposition: A | Discharge status: Home / Self Care | Payer: 59 | Source: Ambulatory Visit | Attending: Surgery | Admitting: Surgery

## 2015-01-03 ENCOUNTER — Ambulatory Visit (INDEPENDENT_AMBULATORY_CARE_PROVIDER_SITE_OTHER): Payer: 59 | Admitting: Pharmacist Clinician (PhC)/ Clinical Pharmacy Specialist

## 2015-01-03 ENCOUNTER — Ambulatory Visit: Payer: 59

## 2015-01-03 ENCOUNTER — Encounter: Payer: Self-pay | Admitting: Surgery

## 2015-01-03 VITALS — BP 121/82 | HR 70 | Resp 16 | Ht 69.5 in | Wt 174.0 lb

## 2015-01-03 DIAGNOSIS — Z95828 Presence of other vascular implants and grafts: Secondary | ICD-10-CM

## 2015-01-03 DIAGNOSIS — I712 Thoracic aortic aneurysm, without rupture, unspecified: Secondary | ICD-10-CM

## 2015-01-03 DIAGNOSIS — I350 Nonrheumatic aortic (valve) stenosis: Secondary | ICD-10-CM

## 2015-01-03 DIAGNOSIS — Z5181 Encounter for therapeutic drug level monitoring: Secondary | ICD-10-CM | POA: Diagnosis not present

## 2015-01-03 LAB — POCT INR: INR: 2.7

## 2015-01-03 MED ORDER — WARFARIN SODIUM 5 MG PO TABS: 5.0000 mg | ORAL_TABLET | Freq: Every day | ORAL | Status: DC

## 2015-01-03 NOTE — Telephone Encounter (Signed)
Warfarin refilled electronically. Previously was printed by mistake.

## 2015-01-03 NOTE — Progress Notes (Signed)
      HPI: Patient returns for routine postoperative follow-up having undergone Bentall procedure with a 21 mm St. Jude mechanical valved graft and replacement of a 4.4 cm ascending aortic aneurysm on 11/27/2014. The patient's early postoperative recovery while in the hospital was notable for an uncomplicated course. Since hospital discharge the patient reports that he has steadily increased his activity and is walking at least 4 miles per day at a good pace without shortness of breath or chest pain. His stamina has dramatically improved postop. His INR was checked today and is 2.7 which has been stable for the past 3 weeks.   Current Outpatient Prescriptions  Medication Sig Dispense Refill  . ALPRAZolam (XANAX) 0.5 MG tablet Take 1 tablet (0.5 mg total) by mouth at bedtime as needed. 90 tablet 0  . Ascorbic Acid (VITAMIN C) 1000 MG tablet Take 1,000 mg by mouth daily.    . CRESTOR 20 MG tablet Take 1 tablet (20 mg total) by mouth daily. 90 tablet 3  . metoprolol tartrate (LOPRESSOR) 25 MG tablet Take 1 tablet (25 mg total) by mouth 2 (two) times daily. 28 tablet 0  . omeprazole (PRILOSEC) 40 MG capsule Take 1 capsule by mouth  daily 90 capsule 1  . warfarin (COUMADIN) 5 MG tablet Take 1 tablet (5 mg total) by mouth daily. Or as directed by [facility] 90 tablet 1   No current facility-administered medications for this visit.    Physical Exam: BP 121/82 mmHg  Pulse 70  Resp 16  Ht 5' 9.5" (1.765 m)  Wt 174 lb (78.926 kg)  BMI 25.34 kg/m2  SpO2 99% He looks well. Lung exam is clear. Cardiac exam shows a regular rate and rhythm with normal mechanical heart sounds.There is no murmur Chest incision is healing well and sternum is stable. There is no peripheral edema.   Diagnostic Tests:  CLINICAL DATA: Heart surgery.  EXAM: CHEST 2 VIEW  COMPARISON: 11/30/2014.  FINDINGS: Prior aortic valve replacement. Cardiomegaly. No pulmonary venous congestion. No focal  pulmonary infiltrate. No pleural effusion or pneumothorax. No acute bony abnormality .  IMPRESSION: Aortic valve replacement. Mild cardiomegaly. No CHF. No acute pulmonary disease.   Electronically Signed  By: Marcello Moores Register  On: 01/03/2015 11:28  Impression:  Overall I think he looks great. I encouraged him to continue walking. He is planning to participate in cardiac rehab for a trial period but he is already doing more activity than he will ever be doing in rehab. I told him he could drive his car but should not lift anything heavier than 10 lbs for three months postop. He would like to return to work part-time and I think that is fine.   Plan:  He is going to continue follow up with Dr. Debara Pickett for his cardiology care and will have his INR followed in the anticoagulation clinic for now. I don't think he requires any further follow up studies of the mechanical valve or aorta at this time.   [name], MD [facility] [phone]

## 2015-01-04 ENCOUNTER — Ambulatory Visit: Payer: 59

## 2015-01-05 ENCOUNTER — Other Ambulatory Visit (HOSPITAL_COMMUNITY): Payer: 59

## 2015-01-08 ENCOUNTER — Encounter: Payer: Self-pay | Admitting: *Deleted

## 2015-01-08 ENCOUNTER — Ambulatory Visit: Payer: 59

## 2015-01-09 ENCOUNTER — Encounter: Payer: Self-pay | Admitting: *Deleted

## 2015-01-09 NOTE — Progress Notes (Signed)
Cardiac Individual Treatment Plan  Patient Details  Name: Frank [name] MRN: [id number] Date of Birth: [birth year] Referring Provider: Dr. Jerilynn Mages, [place] Initial Encounter Date:  12/26/2014 Diagnosis S/P  Ascending aortic  replacement  Patient's Home Medications on Admission:  Current outpatient prescriptions:  .  ALPRAZolam (XANAX) 0.5 MG tablet, Take 1 tablet (0.5 mg total) by mouth at bedtime as needed., Disp: 90 tablet, Rfl: 0 .  Ascorbic Acid (VITAMIN C) 1000 MG tablet, Take 1,000 mg by mouth daily., Disp: , Rfl:  .  CRESTOR 20 MG tablet, Take 1 tablet (20 mg total) by mouth daily., Disp: 90 tablet, Rfl: 3 .  metoprolol tartrate (LOPRESSOR) 25 MG tablet, Take 1 tablet (25 mg total) by mouth 2 (two) times daily., Disp: 28 tablet, Rfl: 0 .  omeprazole (PRILOSEC) 40 MG capsule, Take 1 capsule by mouth  daily, Disp: 90 capsule, Rfl: 1 .  warfarin (COUMADIN) 5 MG tablet, Take 1 tablet (5 mg total) by mouth daily. Or as directed by [facility], Disp: 90 tablet, Rfl: 1  Past Medical History: Past Medical History  Diagnosis Date  . Congenital heart valve abnormality     bicuspid aortic valve  . PONV (postoperative nausea and vomiting)     after a tonsillectomy..none since  . Dysrhythmia     PVC's  . Hypercholesterolemia   . GERD (gastroesophageal reflux disease)     Tobacco Use: History  Smoking status  . Never Smoker   Smokeless tobacco  . Never Used    Labs: Recent Review Flowsheet Data    Labs for ITP Cardiac and Pulmonary Rehab Latest Ref Rng 11/27/2014 11/27/2014 11/27/2014 11/28/2014 12/29/2014   Cholestrol 100 - 199 mg/dL - - - - 279(H)   HDL >39 mg/dL - - - - 38(L)   Trlycerides 0 - 149 mg/dL - - - - 301(H)   PHART 7.350 - 7.450 7.352 - 7.354 - -   PCO2ART 35.0 - 45.0 mmHg 38.1 - 39.0 - -   HCO3 20.0 - 24.0 mEq/L 21.4 - 21.9 - -   TCO2 0 - 100 mmol/L 23 19 23 20  -   ACIDBASEDEF 0.0 - 2.0 mmol/L 4.0(H) - 4.0(H) - -   O2SAT - 90.0 - 96.0 - -       Exercise  Target Goals:    Exercise Program Goal: Individual exercise prescription set with THRR, safety & activity barriers. Participant demonstrates ability to understand and report RPE using BORG scale, to self-measure pulse accurately, and to acknowledge the importance of the exercise prescription.  Exercise Prescription Goal: Starting with aerobic activity 30 plus minutes a day, 3 days per week for initial exercise prescription. Provide home exercise prescription and guidelines that participant acknowledges understanding prior to discharge.  Activity Barriers & Risk Stratification:     Activity Barriers & Risk Stratification - 12/26/14 0830    Activity Barriers & Risk Stratification   Activity Barriers None   Risk Stratification High      6 Minute Walk:     6 Minute Walk      12/26/14 0917       6 Minute Walk   Phase Initial     Distance 1750 feet     Walk Time 6 minutes     Resting HR 61 bpm     Resting BP 136/80 mmHg     Max Ex. HR 112 bpm     Max Ex. BP 146/80 mmHg     RPE 12  Symptoms No        Initial Exercise Prescription:     Initial Exercise Prescription - 12/26/14 0900    Treadmill   MPH 3   Grade 0   Minutes 15   Bike   Level 0.4   Minutes 10   Recumbant Bike   Level 4   Watts 35   Minutes 15   NuStep   Level 3   Watts 45   Minutes 15   Arm Ergometer   Level 1   Watts 8   Minutes 10   Arm/Foot Ergometer   Level 4   Watts 15   Minutes 10   Cybex   Level 3   RPM 60   Minutes 10   Recumbant Elliptical   Level 1   RPM 40   Watts 10   Minutes 15   Elliptical   Level 1   Speed 3   Minutes 1   REL-XR   Level 3   Watts 40   Minutes 15   Prescription Details   Frequency (times per week) 3   Duration Progress to 30 minutes of continuous aerobic without signs/symptoms of physical distress   Intensity   THHR 40-80% of Max Heartrate 106-151   Ratings of Perceived Exertion 11-13   Progression Continue progressive overload as per  policy without signs/symptoms or physical distress.   Resistance Training   Training Prescription Yes   Weight 3   Reps 10-12      Exercise Prescription Changes:     Exercise Prescription Changes      01/08/15 1400           Exercise Review   Progression No  Hasn't started yet, on wait list          Discharge Exercise Prescription:   Nutrition:  Target Goals: Understanding of nutrition guidelines, daily intake of sodium 1500mg , cholesterol 200mg , calories 30% from fat and 7% or less from saturated fats, daily to have 5 or more servings of fruits and vegetables.  Biometrics:     Pre Biometrics - 12/26/14 1004    Pre Biometrics   Height 5' 9.75" (1.772 m)   Weight 175 lb (79.379 kg)   Waist Circumference 35 inches   Hip Circumference 37.5 inches   Waist to Hip Ratio 0.93 %   BMI (Calculated) 25.3       Nutrition Therapy Plan and Nutrition Goals:   Nutrition Discharge: Rate Your Plate Scores:   Nutrition Goals Re-Evaluation:   Psychosocial: Target Goals: Acknowledge presence or absence of depression, maximize coping skills, provide positive support system. Participant is able to verbalize types and ability to use techniques and skills needed for reducing stress and depression.  Initial Review & Psychosocial Screening:     Initial Psych Review & Screening - 12/26/14 [place]? Yes   Barriers   Psychosocial barriers to participate in program There are no identifiable barriers or psychosocial needs.;The patient should benefit from training in stress management and relaxation.   Screening Interventions   Interventions Encouraged to exercise      Quality of Life Scores:   PHQ-9:     Recent Review Flowsheet Data    Depression screen [facility] 2/9 12/26/2014   Decreased Interest 0   Down, Depressed, Hopeless 0   PHQ - 2 Score 0   Altered sleeping 0   Tired, decreased energy 0   Change in appetite 0   Feeling  bad  or failure about yourself  0   Trouble concentrating 0   Moving slowly or fidgety/restless 0   Suicidal thoughts 0   PHQ-9 Score 0      Psychosocial Evaluation and Intervention:   Psychosocial Re-Evaluation:   Vocational Rehabilitation: Provide vocational rehab assistance to qualifying candidates.   Vocational Rehab Evaluation & Intervention:     Vocational Rehab - 12/26/14 4098    Initial Vocational Rehab Evaluation & Intervention   Assessment shows need for Vocational Rehabilitation No      Education: Education Goals: Education classes will be provided on a weekly basis, covering required topics. Participant will state understanding/return demonstration of topics presented.  Learning Barriers/Preferences:     Learning Barriers/Preferences - 12/26/14 0831    Learning Barriers/Preferences   Learning Barriers None   Learning Preferences Written Material      Education Topics: General Nutrition Guidelines/Fats and Fiber: -Group instruction provided by verbal, written material, models and posters to present the general guidelines for heart healthy nutrition. Gives an explanation and review of dietary fats and fiber.   Controlling Sodium/Reading Food Labels: -Group verbal and written material supporting the discussion of sodium use in heart healthy nutrition. Review and explanation with models, verbal and written materials for utilization of the food label.   Exercise Physiology & Risk Factors: - Group verbal and written instruction with models to review the exercise physiology of the cardiovascular system and associated critical values. Details cardiovascular disease risk factors and the goals associated with each risk factor.   Aerobic Exercise & Resistance Training: - Gives group verbal and written discussion on the health impact of inactivity. On the components of aerobic and resistive training programs and the benefits of this training and how to safely progress  through these programs.   Flexibility, Balance, General Exercise Guidelines: - Provides group verbal and written instruction on the benefits of flexibility and balance training programs. Provides general exercise guidelines with specific guidelines to those with heart or lung disease. Demonstration and skill practice provided.   Stress Management: - Provides group verbal and written instruction about the health risks of elevated stress, cause of high stress, and healthy ways to reduce stress.   Depression: - Provides group verbal and written instruction on the correlation between heart/lung disease and depressed mood, treatment options, and the stigmas associated with seeking treatment.   Anatomy & Physiology of the Heart: - Group verbal and written instruction and models provide basic cardiac anatomy and physiology, with the coronary electrical and arterial systems. Review of: AMI, Angina, Valve disease, Heart Failure, Cardiac Arrhythmia, Pacemakers, and the ICD.   Cardiac Procedures: - Group verbal and written instruction and models to describe the testing methods done to diagnose heart disease. Reviews the outcomes of the test results. Describes the treatment choices: Medical Management, Angioplasty, or Coronary Bypass Surgery.   Cardiac Medications: - Group verbal and written instruction to review commonly prescribed medications for heart disease. Reviews the medication, class of the drug, and side effects. Includes the steps to properly store meds and maintain the prescription regimen.   Go Sex-Intimacy & Heart Disease, Get SMART - Goal Setting: - Group verbal and written instruction through game format to discuss heart disease and the return to sexual intimacy. Provides group verbal and written material to discuss and apply goal setting through the application of the S.M.A.R.T. Method.   Other Matters of the Heart: - Provides group verbal, written materials and models to  describe Heart Failure, Angina, Valve Disease,  and Diabetes in the realm of heart disease. Includes description of the disease process and treatment options available to the cardiac patient.   Exercise & Equipment Safety: - Individual verbal instruction and demonstration of equipment use and safety with use of the equipment.   Infection Prevention: - Provides verbal and written material to individual with discussion of infection control including proper hand washing and proper equipment cleaning during exercise session.   Falls Prevention: - Provides verbal and written material to individual with discussion of falls prevention and safety.   Diabetes: - Individual verbal and written instruction to review signs/symptoms of diabetes, desired ranges of glucose level fasting, after meals and with exercise. Advice that pre and post exercise glucose checks will be done for 3 sessions at entry of program.    Knowledge Questionnaire Score:   Personal Goals and Risk Factors at Admission:     Personal Goals and Risk Factors at Admission - 12/26/14 0838    Personal Goals and Risk Factors on Admission   Increase Aerobic Exercise and Physical Activity Yes   Intervention While in program, learn and follow the exercise prescription taught. Start at a low level workload and increase workload after able to maintain previous level for 30 minutes. Increase time before increasing intensity.   Diabetes No   Hypertension No   Lipids Yes   Goal Cholesterol controlled with medications as prescribed, with individualized exercise RX and with personalized nutrition plan. Value goals: LDL < 70mg , HDL > 40mg . Participant states understanding of desired cholesterol values and following prescriptions.   Intervention Provide nutrition & aerobic exercise along with prescribed medications to achieve LDL 70mg , HDL >40mg .   Stress No      Personal Goals and Risk Factors Review:    Personal Goals Discharge:      Comments: 30 day review   Waiting to start program, has completed the orientation

## 2015-01-10 ENCOUNTER — Ambulatory Visit: Payer: 59

## 2015-01-11 ENCOUNTER — Ambulatory Visit: Payer: 59

## 2015-01-12 ENCOUNTER — Encounter: Payer: Self-pay | Admitting: *Deleted

## 2015-01-12 ENCOUNTER — Ambulatory Visit: Payer: Self-pay | Admitting: *Deleted

## 2015-01-12 NOTE — Progress Notes (Signed)
Cardiac Individual Treatment Plan  Patient Details  Name: Frank [name] MRN: [id number] Date of Birth: 03-21-1966 Referring Provider:  Dr. Debara Pickett 05/10/2016Initial Encounter Date:  12/26/2014  Visit Diagnosis: S/P Ascending Aortic Replacement Patient's Home Medications on Admission:  Current outpatient prescriptions:  .  ALPRAZolam (XANAX) 0.5 MG tablet, Take 1 tablet (0.5 mg total) by mouth at bedtime as needed., Disp: 90 tablet, Rfl: 0 .  Ascorbic Acid (VITAMIN C) 1000 MG tablet, Take 1,000 mg by mouth daily., Disp: , Rfl:  .  CRESTOR 20 MG tablet, Take 1 tablet (20 mg total) by mouth daily., Disp: 90 tablet, Rfl: 3 .  metoprolol tartrate (LOPRESSOR) 25 MG tablet, Take 1 tablet (25 mg total) by mouth 2 (two) times daily., Disp: 28 tablet, Rfl: 0 .  omeprazole (PRILOSEC) 40 MG capsule, Take 1 capsule by mouth  daily, Disp: 90 capsule, Rfl: 1 .  warfarin (COUMADIN) 5 MG tablet, Take 1 tablet (5 mg total) by mouth daily. Or as directed by [facility], Disp: 90 tablet, Rfl: 1  Past Medical History: Past Medical History  Diagnosis Date  . Congenital heart valve abnormality     bicuspid aortic valve  . PONV (postoperative nausea and vomiting)     after a tonsillectomy..none since  . Dysrhythmia     PVC's  . Hypercholesterolemia   . GERD (gastroesophageal reflux disease)     Tobacco Use: History  Smoking status  . Never Smoker   Smokeless tobacco  . Never Used    Labs: Recent Review Flowsheet Data    Labs for ITP Cardiac and Pulmonary Rehab Latest Ref Rng 11/27/2014 11/27/2014 11/27/2014 11/28/2014 12/29/2014   Cholestrol 100 - 199 mg/dL - - - - 279(H)   HDL >39 mg/dL - - - - 38(L)   Trlycerides 0 - 149 mg/dL - - - - 301(H)   PHART 7.350 - 7.450 7.352 - 7.354 - -   PCO2ART 35.0 - 45.0 mmHg 38.1 - 39.0 - -   HCO3 20.0 - 24.0 mEq/L 21.4 - 21.9 - -   TCO2 0 - 100 mmol/L 23 19 23 20  -   ACIDBASEDEF 0.0 - 2.0 mmol/L 4.0(H) - 4.0(H) - -   O2SAT - 90.0 - 96.0 - -        Exercise Target Goals:    Exercise Program Goal: Individual exercise prescription set with THRR, safety & activity barriers. Participant demonstrates ability to understand and report RPE using BORG scale, to self-measure pulse accurately, and to acknowledge the importance of the exercise prescription.  Exercise Prescription Goal: Starting with aerobic activity 30 plus minutes a day, 3 days per week for initial exercise prescription. Provide home exercise prescription and guidelines that participant acknowledges understanding prior to discharge.  Activity Barriers & Risk Stratification:     Activity Barriers & Risk Stratification - 12/26/14 0830    Activity Barriers & Risk Stratification   Activity Barriers None   Risk Stratification High      6 Minute Walk:     6 Minute Walk      12/26/14 0917       6 Minute Walk   Phase Initial     Distance 1750 feet     Walk Time 6 minutes     Resting HR 61 bpm     Resting BP 136/80 mmHg     Max Ex. HR 112 bpm     Max Ex. BP 146/80 mmHg     RPE 12     Symptoms  No        Initial Exercise Prescription:     Initial Exercise Prescription - 12/26/14 0900    Treadmill   MPH 3   Grade 0   Minutes 15   Bike   Level 0.4   Minutes 10   Recumbant Bike   Level 4   Watts 35   Minutes 15   NuStep   Level 3   Watts 45   Minutes 15   Arm Ergometer   Level 1   Watts 8   Minutes 10   Arm/Foot Ergometer   Level 4   Watts 15   Minutes 10   Cybex   Level 3   RPM 60   Minutes 10   Recumbant Elliptical   Level 1   RPM 40   Watts 10   Minutes 15   Elliptical   Level 1   Speed 3   Minutes 1   REL-XR   Level 3   Watts 40   Minutes 15   Prescription Details   Frequency (times per week) 3   Duration Progress to 30 minutes of continuous aerobic without signs/symptoms of physical distress   Intensity   THHR 40-80% of Max Heartrate 106-151   Ratings of Perceived Exertion 11-13   Progression Continue progressive  overload as per policy without signs/symptoms or physical distress.   Resistance Training   Training Prescription Yes   Weight 3   Reps 10-12      Exercise Prescription Changes:     Exercise Prescription Changes      01/08/15 1400           Exercise Review   Progression No  Hasn't started yet, on wait list          Discharge Exercise Prescription:   Nutrition:  Target Goals: Understanding of nutrition guidelines, daily intake of sodium 1500mg , cholesterol 200mg , calories 30% from fat and 7% or less from saturated fats, daily to have 5 or more servings of fruits and vegetables.  Biometrics:     Pre Biometrics - 12/26/14 1004    Pre Biometrics   Height 5' 9.75" (1.772 m)   Weight 175 lb (79.379 kg)   Waist Circumference 35 inches   Hip Circumference 37.5 inches   Waist to Hip Ratio 0.93 %   BMI (Calculated) 25.3       Nutrition Therapy Plan and Nutrition Goals:   Nutrition Discharge: Rate Your Plate Scores:   Nutrition Goals Re-Evaluation:   Psychosocial: Target Goals: Acknowledge presence or absence of depression, maximize coping skills, provide positive support system. Participant is able to verbalize types and ability to use techniques and skills needed for reducing stress and depression.  Initial Review & Psychosocial Screening:     Initial Psych Review & Screening - 12/26/14 [place]? Yes   Barriers   Psychosocial barriers to participate in program There are no identifiable barriers or psychosocial needs.;The patient should benefit from training in stress management and relaxation.   Screening Interventions   Interventions Encouraged to exercise      Quality of Life Scores:   PHQ-9:     Recent Review Flowsheet Data    Depression screen [facility] 2/9 12/26/2014   Decreased Interest 0   Down, Depressed, Hopeless 0   PHQ - 2 Score 0   Altered sleeping 0   Tired, decreased energy 0   Change in appetite 0    Feeling  bad or failure about yourself  0   Trouble concentrating 0   Moving slowly or fidgety/restless 0   Suicidal thoughts 0   PHQ-9 Score 0      Psychosocial Evaluation and Intervention:   Psychosocial Re-Evaluation:   Vocational Rehabilitation: Provide vocational rehab assistance to qualifying candidates.   Vocational Rehab Evaluation & Intervention:     Vocational Rehab - 12/26/14 3300    Initial Vocational Rehab Evaluation & Intervention   Assessment shows need for Vocational Rehabilitation No      Education: Education Goals: Education classes will be provided on a weekly basis, covering required topics. Participant will state understanding/return demonstration of topics presented.  Learning Barriers/Preferences:     Learning Barriers/Preferences - 12/26/14 0831    Learning Barriers/Preferences   Learning Barriers None   Learning Preferences Written Material      Education Topics: General Nutrition Guidelines/Fats and Fiber: -Group instruction provided by verbal, written material, models and posters to present the general guidelines for heart healthy nutrition. Gives an explanation and review of dietary fats and fiber.   Controlling Sodium/Reading Food Labels: -Group verbal and written material supporting the discussion of sodium use in heart healthy nutrition. Review and explanation with models, verbal and written materials for utilization of the food label.   Exercise Physiology & Risk Factors: - Group verbal and written instruction with models to review the exercise physiology of the cardiovascular system and associated critical values. Details cardiovascular disease risk factors and the goals associated with each risk factor.   Aerobic Exercise & Resistance Training: - Gives group verbal and written discussion on the health impact of inactivity. On the components of aerobic and resistive training programs and the benefits of this training and how to  safely progress through these programs.   Flexibility, Balance, General Exercise Guidelines: - Provides group verbal and written instruction on the benefits of flexibility and balance training programs. Provides general exercise guidelines with specific guidelines to those with heart or lung disease. Demonstration and skill practice provided.   Stress Management: - Provides group verbal and written instruction about the health risks of elevated stress, cause of high stress, and healthy ways to reduce stress.   Depression: - Provides group verbal and written instruction on the correlation between heart/lung disease and depressed mood, treatment options, and the stigmas associated with seeking treatment.   Anatomy & Physiology of the Heart: - Group verbal and written instruction and models provide basic cardiac anatomy and physiology, with the coronary electrical and arterial systems. Review of: AMI, Angina, Valve disease, Heart Failure, Cardiac Arrhythmia, Pacemakers, and the ICD.   Cardiac Procedures: - Group verbal and written instruction and models to describe the testing methods done to diagnose heart disease. Reviews the outcomes of the test results. Describes the treatment choices: Medical Management, Angioplasty, or Coronary Bypass Surgery.   Cardiac Medications: - Group verbal and written instruction to review commonly prescribed medications for heart disease. Reviews the medication, class of the drug, and side effects. Includes the steps to properly store meds and maintain the prescription regimen.   Go Sex-Intimacy & Heart Disease, Get SMART - Goal Setting: - Group verbal and written instruction through game format to discuss heart disease and the return to sexual intimacy. Provides group verbal and written material to discuss and apply goal setting through the application of the S.M.A.R.T. Method.   Other Matters of the Heart: - Provides group verbal, written materials and  models to describe Heart Failure, Angina, Valve Disease, and  Diabetes in the realm of heart disease. Includes description of the disease process and treatment options available to the cardiac patient.   Exercise & Equipment Safety: - Individual verbal instruction and demonstration of equipment use and safety with use of the equipment.   Infection Prevention: - Provides verbal and written material to individual with discussion of infection control including proper hand washing and proper equipment cleaning during exercise session.   Falls Prevention: - Provides verbal and written material to individual with discussion of falls prevention and safety.   Diabetes: - Individual verbal and written instruction to review signs/symptoms of diabetes, desired ranges of glucose level fasting, after meals and with exercise. Advice that pre and post exercise glucose checks will be done for 3 sessions at entry of program.    Knowledge Questionnaire Score:   Personal Goals and Risk Factors at Admission:     Personal Goals and Risk Factors at Admission - 12/26/14 0838    Personal Goals and Risk Factors on Admission   Increase Aerobic Exercise and Physical Activity Yes   Intervention While in program, learn and follow the exercise prescription taught. Start at a low level workload and increase workload after able to maintain previous level for 30 minutes. Increase time before increasing intensity.   Diabetes No   Hypertension No   Lipids Yes   Goal Cholesterol controlled with medications as prescribed, with individualized exercise RX and with personalized nutrition plan. Value goals: LDL < 70mg , HDL > 40mg . Participant states understanding of desired cholesterol values and following prescriptions.   Intervention Provide nutrition & aerobic exercise along with prescribed medications to achieve LDL 70mg , HDL >40mg .   Stress No      Personal Goals and Risk Factors Review:    Personal Goals  Discharge:     Comments: Maximilien has called to state he will not be attending the program. He has completed the orientation and no other sessions.

## 2015-01-12 NOTE — Progress Notes (Signed)
Discharge Summary  Patient Details  Name: Frank [name] MRN: [id number] Date of Birth: Nov 28, 1965 Referring Provider:  Dr. Debara [name] Number of Visits: 1  S/P Ascending Aortic Replacement   Reason for Discharge:  Early Exit:  Back to work  Smoking History:  History  Smoking status  . Never Smoker   Smokeless tobacco  . Never Used    Diagnosis:  No diagnosis found.  ADL UCSD:   Initial Exercise Prescription:     Initial Exercise Prescription - 12/26/14 0900    Treadmill   MPH 3   Grade 0   Minutes 15   Bike   Level 0.4   Minutes 10   Recumbant Bike   Level 4   Watts 35   Minutes 15   NuStep   Level 3   Watts 45   Minutes 15   Arm Ergometer   Level 1   Watts 8   Minutes 10   Arm/Foot Ergometer   Level 4   Watts 15   Minutes 10   Cybex   Level 3   RPM 60   Minutes 10   Recumbant Elliptical   Level 1   RPM 40   Watts 10   Minutes 15   Elliptical   Level 1   Speed 3   Minutes 1   REL-XR   Level 3   Watts 40   Minutes 15   Prescription Details   Frequency (times per week) 3   Duration Progress to 30 minutes of continuous aerobic without signs/symptoms of physical distress   Intensity   THHR 40-80% of Max Heartrate 106-151   Ratings of Perceived Exertion 11-13   Progression Continue progressive overload as per policy without signs/symptoms or physical distress.   Resistance Training   Training Prescription Yes   Weight 3   Reps 10-12      Discharge Exercise Prescription:   Functional Capacity:     6 Minute Walk      12/26/14 0917       6 Minute Walk   Phase Initial     Distance 1750 feet     Walk Time 6 minutes     Resting HR 61 bpm     Resting BP 136/80 mmHg     Max Ex. HR 112 bpm     Max Ex. BP 146/80 mmHg     RPE 12     Symptoms No        Psychological, QOL, Others - Outcomes: PHQ 2/9: Depression screen PHQ 2/9 12/26/2014  Decreased Interest 0  Down, Depressed, Hopeless 0  PHQ - 2 Score 0  Altered sleeping 0   Tired, decreased energy 0  Change in appetite 0  Feeling bad or failure about yourself  0  Trouble concentrating 0  Moving slowly or fidgety/restless 0  Suicidal thoughts 0  PHQ-9 Score 0    Quality of Life:   Personal Goals: Goals established at orientation with interventions provided to work toward goal.     Personal Goals and Risk Factors at Admission - 12/26/14 0838    Personal Goals and Risk Factors on Admission   Increase Aerobic Exercise and Physical Activity Yes   Intervention While in program, learn and follow the exercise prescription taught. Start at a low level workload and increase workload after able to maintain previous level for 30 minutes. Increase time before increasing intensity.   Diabetes No   Hypertension No   Lipids Yes   Goal Cholesterol  controlled with medications as prescribed, with individualized exercise RX and with personalized nutrition plan. Value goals: LDL < 70mg , HDL > 40mg . Participant states understanding of desired cholesterol values and following prescriptions.   Intervention Provide nutrition & aerobic exercise along with prescribed medications to achieve LDL 70mg , HDL >40mg .   Stress No       Personal Goals Discharge:   Nutrition & Weight - Outcomes:     Pre Biometrics - 12/26/14 1004    Pre Biometrics   Height 5' 9.75" (1.772 m)   Weight 175 lb (79.379 kg)   Waist Circumference 35 inches   Hip Circumference 37.5 inches   Waist to Hip Ratio 0.93 %   BMI (Calculated) 25.3       Nutrition:   Nutrition Discharge:   Education Questionnaire Score:   Frank [name] has called and stated that he will not attend the program. He has completed the orientation and no other sessions.

## 2015-01-17 ENCOUNTER — Ambulatory Visit: Payer: 59

## 2015-01-17 ENCOUNTER — Ambulatory Visit (INDEPENDENT_AMBULATORY_CARE_PROVIDER_SITE_OTHER): Payer: 59 | Admitting: Pharmacist Clinician (PhC)/ Clinical Pharmacy Specialist

## 2015-01-17 DIAGNOSIS — I350 Nonrheumatic aortic (valve) stenosis: Secondary | ICD-10-CM

## 2015-01-17 DIAGNOSIS — Z95828 Presence of other vascular implants and grafts: Secondary | ICD-10-CM | POA: Diagnosis not present

## 2015-01-17 DIAGNOSIS — Z5181 Encounter for therapeutic drug level monitoring: Secondary | ICD-10-CM

## 2015-01-17 LAB — POCT INR: INR: 2.2

## 2015-01-18 ENCOUNTER — Ambulatory Visit: Payer: 59

## 2015-01-22 ENCOUNTER — Ambulatory Visit: Payer: 59

## 2015-01-24 ENCOUNTER — Ambulatory Visit: Payer: 59

## 2015-01-25 ENCOUNTER — Ambulatory Visit: Payer: 59

## 2015-01-26 ENCOUNTER — Other Ambulatory Visit: Payer: Self-pay | Admitting: *Deleted

## 2015-01-26 DIAGNOSIS — E785 Hyperlipidemia, unspecified: Secondary | ICD-10-CM

## 2015-01-26 MED ORDER — ROSUVASTATIN CALCIUM 20 MG PO TABS: 20.0000 mg | ORAL_TABLET | Freq: Every day | ORAL | Status: DC

## 2015-01-26 NOTE — Telephone Encounter (Signed)
Rx(s) sent to pharmacy electronically.  

## 2015-01-29 ENCOUNTER — Ambulatory Visit: Payer: 59

## 2015-01-29 ENCOUNTER — Other Ambulatory Visit: Payer: Self-pay

## 2015-01-29 DIAGNOSIS — E785 Hyperlipidemia, unspecified: Secondary | ICD-10-CM

## 2015-01-29 MED ORDER — ROSUVASTATIN CALCIUM 20 MG PO TABS: 20.0000 mg | ORAL_TABLET | Freq: Every day | ORAL | Status: DC

## 2015-01-30 ENCOUNTER — Ambulatory Visit (INDEPENDENT_AMBULATORY_CARE_PROVIDER_SITE_OTHER): Payer: 59 | Admitting: Pharmacist Clinician (PhC)/ Clinical Pharmacy Specialist

## 2015-01-30 DIAGNOSIS — Z5181 Encounter for therapeutic drug level monitoring: Secondary | ICD-10-CM | POA: Diagnosis not present

## 2015-01-30 DIAGNOSIS — I350 Nonrheumatic aortic (valve) stenosis: Secondary | ICD-10-CM | POA: Diagnosis not present

## 2015-01-30 DIAGNOSIS — Z95828 Presence of other vascular implants and grafts: Secondary | ICD-10-CM | POA: Diagnosis not present

## 2015-01-30 LAB — POCT INR: INR: 2.7

## 2015-01-31 ENCOUNTER — Ambulatory Visit: Payer: 59

## 2015-02-01 ENCOUNTER — Encounter: Payer: Self-pay | Admitting: *Deleted

## 2015-02-01 ENCOUNTER — Ambulatory Visit: Payer: 59

## 2015-02-01 DIAGNOSIS — Z952 Presence of prosthetic heart valve: Secondary | ICD-10-CM

## 2015-02-01 NOTE — Progress Notes (Signed)
Cardiac Individual Treatment Plan  Patient Details  Name: Frank [name] MRN: [id number] Date of Birth: 04-May-1966 Referring Provider:  Dr. Debara Pickett Initial Encounter Date:  12/26/2014  Visit Diagnosis: H/O aortic valve replacement  Patient's Home Medications on Admission:  Current outpatient prescriptions:  .  ALPRAZolam (XANAX) 0.5 MG tablet, Take 1 tablet (0.5 mg total) by mouth at bedtime as needed., Disp: 90 tablet, Rfl: 0 .  Ascorbic Acid (VITAMIN C) 1000 MG tablet, Take 1,000 mg by mouth daily., Disp: , Rfl:  .  metoprolol tartrate (LOPRESSOR) 25 MG tablet, Take 1 tablet (25 mg total) by mouth 2 (two) times daily., Disp: 28 tablet, Rfl: 0 .  omeprazole (PRILOSEC) 40 MG capsule, Take 1 capsule by mouth  daily, Disp: 90 capsule, Rfl: 1 .  rosuvastatin (CRESTOR) 20 MG tablet, Take 1 tablet (20 mg total) by mouth daily., Disp: 90 tablet, Rfl: 3 .  warfarin (COUMADIN) 5 MG tablet, Take 1 tablet (5 mg total) by mouth daily. Or as directed by [facility], Disp: 90 tablet, Rfl: 1  Past Medical History: Past Medical History  Diagnosis Date  . Congenital heart valve abnormality     bicuspid aortic valve  . PONV (postoperative nausea and vomiting)     after a tonsillectomy..none since  . Dysrhythmia     PVC's  . Hypercholesterolemia   . GERD (gastroesophageal reflux disease)     Tobacco Use: History  Smoking status  . Never Smoker   Smokeless tobacco  . Never Used    Labs: Recent Review Flowsheet Data    Labs for ITP Cardiac and Pulmonary Rehab Latest Ref Rng 11/27/2014 11/27/2014 11/27/2014 11/28/2014 12/29/2014   Cholestrol 100 - 199 mg/dL - - - - 279(H)   HDL >39 mg/dL - - - - 38(L)   Trlycerides 0 - 149 mg/dL - - - - 301(H)   PHART 7.350 - 7.450 7.352 - 7.354 - -   PCO2ART 35.0 - 45.0 mmHg 38.1 - 39.0 - -   HCO3 20.0 - 24.0 mEq/L 21.4 - 21.9 - -   TCO2 0 - 100 mmol/L 23 19 23 20  -   ACIDBASEDEF 0.0 - 2.0 mmol/L 4.0(H) - 4.0(H) - -   O2SAT - 90.0 - 96.0 - -        Exercise Target Goals:    Exercise Program Goal: Individual exercise prescription set with THRR, safety & activity barriers. Participant demonstrates ability to understand and report RPE using BORG scale, to self-measure pulse accurately, and to acknowledge the importance of the exercise prescription.  Exercise Prescription Goal: Starting with aerobic activity 30 plus minutes a day, 3 days per week for initial exercise prescription. Provide home exercise prescription and guidelines that participant acknowledges understanding prior to discharge.  Activity Barriers & Risk Stratification:     Activity Barriers & Risk Stratification - 12/26/14 0830    Activity Barriers & Risk Stratification   Activity Barriers None   Risk Stratification High      6 Minute Walk:     6 Minute Walk      12/26/14 0917       6 Minute Walk   Phase Initial     Distance 1750 feet     Walk Time 6 minutes     Resting HR 61 bpm     Resting BP 136/80 mmHg     Max Ex. HR 112 bpm     Max Ex. BP 146/80 mmHg     RPE 12  Symptoms No        Initial Exercise Prescription:     Initial Exercise Prescription - 12/26/14 0900    Treadmill   MPH 3   Grade 0   Minutes 15   Bike   Level 0.4   Minutes 10   Recumbant Bike   Level 4   Watts 35   Minutes 15   NuStep   Level 3   Watts 45   Minutes 15   Arm Ergometer   Level 1   Watts 8   Minutes 10   Arm/Foot Ergometer   Level 4   Watts 15   Minutes 10   Cybex   Level 3   RPM 60   Minutes 10   Recumbant Elliptical   Level 1   RPM 40   Watts 10   Minutes 15   Elliptical   Level 1   Speed 3   Minutes 1   REL-XR   Level 3   Watts 40   Minutes 15   Prescription Details   Frequency (times per week) 3   Duration Progress to 30 minutes of continuous aerobic without signs/symptoms of physical distress   Intensity   THHR 40-80% of Max Heartrate 106-151   Ratings of Perceived Exertion 11-13   Progression Continue progressive  overload as per policy without signs/symptoms or physical distress.   Resistance Training   Training Prescription Yes   Weight 3   Reps 10-12      Exercise Prescription Changes:     Exercise Prescription Changes      01/08/15 1400           Exercise Review   Progression No  Hasn't started yet, on wait list          Discharge Exercise Prescription:   Nutrition:  Target Goals: Understanding of nutrition guidelines, daily intake of sodium 1500mg , cholesterol 200mg , calories 30% from fat and 7% or less from saturated fats, daily to have 5 or more servings of fruits and vegetables.  Biometrics:     Pre Biometrics - 12/26/14 1004    Pre Biometrics   Height 5' 9.75" (1.772 m)   Weight 175 lb (79.379 kg)   Waist Circumference 35 inches   Hip Circumference 37.5 inches   Waist to Hip Ratio 0.93 %   BMI (Calculated) 25.3       Nutrition Therapy Plan and Nutrition Goals:   Nutrition Discharge: Rate Your Plate Scores:   Nutrition Goals Re-Evaluation:   Psychosocial: Target Goals: Acknowledge presence or absence of depression, maximize coping skills, provide positive support system. Participant is able to verbalize types and ability to use techniques and skills needed for reducing stress and depression.  Initial Review & Psychosocial Screening:     Initial Psych Review & Screening - 12/26/14 [place]? Yes   Barriers   Psychosocial barriers to participate in program There are no identifiable barriers or psychosocial needs.;The patient should benefit from training in stress management and relaxation.   Screening Interventions   Interventions Encouraged to exercise      Quality of Life Scores:   PHQ-9:     Recent Review Flowsheet Data    Depression screen [facility] 2/9 12/26/2014   Decreased Interest 0   Down, Depressed, Hopeless 0   PHQ - 2 Score 0   Altered sleeping 0   Tired, decreased energy 0   Change in appetite 0  Feeling bad or failure about yourself  0   Trouble concentrating 0   Moving slowly or fidgety/restless 0   Suicidal thoughts 0   PHQ-9 Score 0      Psychosocial Evaluation and Intervention:   Psychosocial Re-Evaluation:   Vocational Rehabilitation: Provide vocational rehab assistance to qualifying candidates.   Vocational Rehab Evaluation & Intervention:     Vocational Rehab - 12/26/14 1829    Initial Vocational Rehab Evaluation & Intervention   Assessment shows need for Vocational Rehabilitation No      Education: Education Goals: Education classes will be provided on a weekly basis, covering required topics. Participant will state understanding/return demonstration of topics presented.  Learning Barriers/Preferences:     Learning Barriers/Preferences - 12/26/14 0831    Learning Barriers/Preferences   Learning Barriers None   Learning Preferences Written Material      Education Topics: General Nutrition Guidelines/Fats and Fiber: -Group instruction provided by verbal, written material, models and posters to present the general guidelines for heart healthy nutrition. Gives an explanation and review of dietary fats and fiber.   Controlling Sodium/Reading Food Labels: -Group verbal and written material supporting the discussion of sodium use in heart healthy nutrition. Review and explanation with models, verbal and written materials for utilization of the food label.   Exercise Physiology & Risk Factors: - Group verbal and written instruction with models to review the exercise physiology of the cardiovascular system and associated critical values. Details cardiovascular disease risk factors and the goals associated with each risk factor.   Aerobic Exercise & Resistance Training: - Gives group verbal and written discussion on the health impact of inactivity. On the components of aerobic and resistive training programs and the benefits of this training and how to  safely progress through these programs.   Flexibility, Balance, General Exercise Guidelines: - Provides group verbal and written instruction on the benefits of flexibility and balance training programs. Provides general exercise guidelines with specific guidelines to those with heart or lung disease. Demonstration and skill practice provided.   Stress Management: - Provides group verbal and written instruction about the health risks of elevated stress, cause of high stress, and healthy ways to reduce stress.   Depression: - Provides group verbal and written instruction on the correlation between heart/lung disease and depressed mood, treatment options, and the stigmas associated with seeking treatment.   Anatomy & Physiology of the Heart: - Group verbal and written instruction and models provide basic cardiac anatomy and physiology, with the coronary electrical and arterial systems. Review of: AMI, Angina, Valve disease, Heart Failure, Cardiac Arrhythmia, Pacemakers, and the ICD.   Cardiac Procedures: - Group verbal and written instruction and models to describe the testing methods done to diagnose heart disease. Reviews the outcomes of the test results. Describes the treatment choices: Medical Management, Angioplasty, or Coronary Bypass Surgery.   Cardiac Medications: - Group verbal and written instruction to review commonly prescribed medications for heart disease. Reviews the medication, class of the drug, and side effects. Includes the steps to properly store meds and maintain the prescription regimen.   Go Sex-Intimacy & Heart Disease, Get SMART - Goal Setting: - Group verbal and written instruction through game format to discuss heart disease and the return to sexual intimacy. Provides group verbal and written material to discuss and apply goal setting through the application of the S.M.A.R.T. Method.   Other Matters of the Heart: - Provides group verbal, written materials and  models to describe Heart Failure, Angina, Valve Disease,  and Diabetes in the realm of heart disease. Includes description of the disease process and treatment options available to the cardiac patient.   Exercise & Equipment Safety: - Individual verbal instruction and demonstration of equipment use and safety with use of the equipment.   Infection Prevention: - Provides verbal and written material to individual with discussion of infection control including proper hand washing and proper equipment cleaning during exercise session.   Falls Prevention: - Provides verbal and written material to individual with discussion of falls prevention and safety.   Diabetes: - Individual verbal and written instruction to review signs/symptoms of diabetes, desired ranges of glucose level fasting, after meals and with exercise. Advice that pre and post exercise glucose checks will be done for 3 sessions at entry of program.    Knowledge Questionnaire Score:   Personal Goals and Risk Factors at Admission:     Personal Goals and Risk Factors at Admission - 12/26/14 0838    Personal Goals and Risk Factors on Admission   Increase Aerobic Exercise and Physical Activity Yes   Intervention While in program, learn and follow the exercise prescription taught. Start at a low level workload and increase workload after able to maintain previous level for 30 minutes. Increase time before increasing intensity.   Diabetes No   Hypertension No   Lipids Yes   Goal Cholesterol controlled with medications as prescribed, with individualized exercise RX and with personalized nutrition plan. Value goals: LDL < 70mg , HDL > 40mg . Participant states understanding of desired cholesterol values and following prescriptions.   Intervention Provide nutrition & aerobic exercise along with prescribed medications to achieve LDL 70mg , HDL >40mg .   Stress No      Personal Goals and Risk Factors Review:    Personal Goals  Discharge:     Comments: Attended orientation only, decided not to complete the program.

## 2015-02-01 NOTE — Patient Instructions (Signed)
Discharge Instructions  Patient Details  Name: Frank [name] MRN: [id number] Date of Birth: Feb 12, 1966 Referring Provider:  No ref. provider found   Number of Visits: 1  Reason for Discharge:  Early Exit:  Personal  Smoking History:  History  Smoking status  . Never Smoker   Smokeless tobacco  . Never Used    Diagnosis:  H/O aortic valve replacement - Plan: CARDIAC REHAB 30 DAY REVIEW  Initial Exercise Prescription:     Initial Exercise Prescription - 12/26/14 0900    Treadmill   MPH 3   Grade 0   Minutes 15   Bike   Level 0.4   Minutes 10   Recumbant Bike   Level 4   Watts 35   Minutes 15   NuStep   Level 3   Watts 45   Minutes 15   Arm Ergometer   Level 1   Watts 8   Minutes 10   Arm/Foot Ergometer   Level 4   Watts 15   Minutes 10   Cybex   Level 3   RPM 60   Minutes 10   Recumbant Elliptical   Level 1   RPM 40   Watts 10   Minutes 15   Elliptical   Level 1   Speed 3   Minutes 1   REL-XR   Level 3   Watts 40   Minutes 15   Prescription Details   Frequency (times per week) 3   Duration Progress to 30 minutes of continuous aerobic without signs/symptoms of physical distress   Intensity   THHR 40-80% of Max Heartrate 106-151   Ratings of Perceived Exertion 11-13   Progression Continue progressive overload as per policy without signs/symptoms or physical distress.   Resistance Training   Training Prescription Yes   Weight 3   Reps 10-12      Discharge Exercise Prescription:   Functional Capacity:     6 Minute Walk      12/26/14 0917       6 Minute Walk   Phase Initial     Distance 1750 feet     Walk Time 6 minutes     Resting HR 61 bpm     Resting BP 136/80 mmHg     Max Ex. HR 112 bpm     Max Ex. BP 146/80 mmHg     RPE 12     Symptoms No        Quality of Life:   Personal Goals: Goals established at orientation with interventions provided to work toward goal.     Personal Goals and Risk Factors at  Admission - 12/26/14 0838    Personal Goals and Risk Factors on Admission   Increase Aerobic Exercise and Physical Activity Yes   Intervention While in program, learn and follow the exercise prescription taught. Start at a low level workload and increase workload after able to maintain previous level for 30 minutes. Increase time before increasing intensity.   Diabetes No   Hypertension No   Lipids Yes   Goal Cholesterol controlled with medications as prescribed, with individualized exercise RX and with personalized nutrition plan. Value goals: LDL < 70mg , HDL > 40mg . Participant states understanding of desired cholesterol values and following prescriptions.   Intervention Provide nutrition & aerobic exercise along with prescribed medications to achieve LDL 70mg , HDL >40mg .   Stress No       Personal Goals Discharge:   Nutrition & Weight - Outcomes:  Pre Biometrics - 12/26/14 1004    Pre Biometrics   Height 5' 9.75" (1.772 m)   Weight 175 lb (79.379 kg)   Waist Circumference 35 inches   Hip Circumference 37.5 inches   Waist to Hip Ratio 0.93 %   BMI (Calculated) 25.3       Nutrition:   Nutrition Discharge:   Education Questionnaire Score:   Goals reviewed with patient; copy given to patient.

## 2015-02-01 NOTE — Progress Notes (Signed)
Discharge Summary  Patient Details  Name: Frank [name] MRN: [id number] Date of Birth: 1965-10-20 Referring Provider:  No ref. provider found   Number of Visits: 1  Reason for Discharge:  Early Exit:  Personal   Decided not to do the program.  Smoking History:  History  Smoking status  . Never Smoker   Smokeless tobacco  . Never Used    Diagnosis:  H/O aortic valve replacement  ADL UCSD:   Initial Exercise Prescription:     Initial Exercise Prescription - 12/26/14 0900    Treadmill   MPH 3   Grade 0   Minutes 15   Bike   Level 0.4   Minutes 10   Recumbant Bike   Level 4   Watts 35   Minutes 15   NuStep   Level 3   Watts 45   Minutes 15   Arm Ergometer   Level 1   Watts 8   Minutes 10   Arm/Foot Ergometer   Level 4   Watts 15   Minutes 10   Cybex   Level 3   RPM 60   Minutes 10   Recumbant Elliptical   Level 1   RPM 40   Watts 10   Minutes 15   Elliptical   Level 1   Speed 3   Minutes 1   REL-XR   Level 3   Watts 40   Minutes 15   Prescription Details   Frequency (times per week) 3   Duration Progress to 30 minutes of continuous aerobic without signs/symptoms of physical distress   Intensity   THHR 40-80% of Max Heartrate 106-151   Ratings of Perceived Exertion 11-13   Progression Continue progressive overload as per policy without signs/symptoms or physical distress.   Resistance Training   Training Prescription Yes   Weight 3   Reps 10-12      Discharge Exercise Prescription:   Functional Capacity:     6 Minute Walk      12/26/14 0917       6 Minute Walk   Phase Initial     Distance 1750 feet     Walk Time 6 minutes     Resting HR 61 bpm     Resting BP 136/80 mmHg     Max Ex. HR 112 bpm     Max Ex. BP 146/80 mmHg     RPE 12     Symptoms No        Psychological, QOL, Others - Outcomes: PHQ 2/9: Depression screen PHQ 2/9 12/26/2014  Decreased Interest 0  Down, Depressed, Hopeless 0  PHQ - 2 Score 0   Altered sleeping 0  Tired, decreased energy 0  Change in appetite 0  Feeling bad or failure about yourself  0  Trouble concentrating 0  Moving slowly or fidgety/restless 0  Suicidal thoughts 0  PHQ-9 Score 0    Quality of Life:   Personal Goals: Goals established at orientation with interventions provided to work toward goal.     Personal Goals and Risk Factors at Admission - 12/26/14 0838    Personal Goals and Risk Factors on Admission   Increase Aerobic Exercise and Physical Activity Yes   Intervention While in program, learn and follow the exercise prescription taught. Start at a low level workload and increase workload after able to maintain previous level for 30 minutes. Increase time before increasing intensity.   Diabetes No   Hypertension No   Lipids  Yes   Goal Cholesterol controlled with medications as prescribed, with individualized exercise RX and with personalized nutrition plan. Value goals: LDL < 70mg , HDL > 40mg . Participant states understanding of desired cholesterol values and following prescriptions.   Intervention Provide nutrition & aerobic exercise along with prescribed medications to achieve LDL 70mg , HDL >40mg .   Stress No       Personal Goals Discharge:   Nutrition & Weight - Outcomes:     Pre Biometrics - 12/26/14 1004    Pre Biometrics   Height 5' 9.75" (1.772 m)   Weight 175 lb (79.379 kg)   Waist Circumference 35 inches   Hip Circumference 37.5 inches   Waist to Hip Ratio 0.93 %   BMI (Calculated) 25.3       Nutrition:   Nutrition Discharge:   Education Questionnaire Score:   Goals reviewed with patient; copy given to patient.

## 2015-02-05 ENCOUNTER — Ambulatory Visit: Payer: 59

## 2015-02-07 ENCOUNTER — Ambulatory Visit: Payer: 59

## 2015-02-08 ENCOUNTER — Ambulatory Visit: Payer: 59

## 2015-02-12 ENCOUNTER — Ambulatory Visit: Payer: 59

## 2015-02-14 ENCOUNTER — Ambulatory Visit: Payer: 59

## 2015-02-15 ENCOUNTER — Ambulatory Visit: Payer: 59

## 2015-02-20 ENCOUNTER — Encounter: Payer: Self-pay | Admitting: *Deleted

## 2015-02-20 DIAGNOSIS — Z952 Presence of prosthetic heart valve: Secondary | ICD-10-CM

## 2015-02-21 ENCOUNTER — Ambulatory Visit: Payer: 59

## 2015-02-22 ENCOUNTER — Ambulatory Visit: Payer: 59

## 2015-02-26 ENCOUNTER — Ambulatory Visit: Payer: 59

## 2015-02-28 ENCOUNTER — Ambulatory Visit (INDEPENDENT_AMBULATORY_CARE_PROVIDER_SITE_OTHER): Payer: 59 | Admitting: Pharmacist Clinician (PhC)/ Clinical Pharmacy Specialist

## 2015-02-28 ENCOUNTER — Ambulatory Visit: Payer: 59

## 2015-02-28 DIAGNOSIS — Z5181 Encounter for therapeutic drug level monitoring: Secondary | ICD-10-CM | POA: Diagnosis not present

## 2015-02-28 DIAGNOSIS — Z95828 Presence of other vascular implants and grafts: Secondary | ICD-10-CM | POA: Diagnosis not present

## 2015-02-28 DIAGNOSIS — Z954 Presence of other heart-valve replacement: Secondary | ICD-10-CM | POA: Diagnosis not present

## 2015-02-28 DIAGNOSIS — I350 Nonrheumatic aortic (valve) stenosis: Secondary | ICD-10-CM

## 2015-02-28 LAB — POCT INR: INR: 3.1

## 2015-03-01 ENCOUNTER — Ambulatory Visit: Payer: 59

## 2015-03-05 ENCOUNTER — Ambulatory Visit: Payer: 59

## 2015-03-07 ENCOUNTER — Ambulatory Visit: Payer: 59

## 2015-03-08 ENCOUNTER — Ambulatory Visit: Payer: 59

## 2015-03-12 ENCOUNTER — Ambulatory Visit: Payer: 59

## 2015-03-14 ENCOUNTER — Ambulatory Visit: Payer: 59

## 2015-03-15 ENCOUNTER — Ambulatory Visit: Payer: 59

## 2015-03-19 ENCOUNTER — Ambulatory Visit: Payer: 59

## 2015-03-21 ENCOUNTER — Ambulatory Visit: Payer: 59

## 2015-03-22 ENCOUNTER — Ambulatory Visit: Payer: 59

## 2015-03-26 ENCOUNTER — Ambulatory Visit: Payer: 59

## 2015-03-28 ENCOUNTER — Ambulatory Visit (INDEPENDENT_AMBULATORY_CARE_PROVIDER_SITE_OTHER): Payer: 59 | Admitting: Pharmacist Clinician (PhC)/ Clinical Pharmacy Specialist

## 2015-03-28 ENCOUNTER — Ambulatory Visit: Payer: 59

## 2015-03-28 DIAGNOSIS — Z954 Presence of other heart-valve replacement: Secondary | ICD-10-CM

## 2015-03-28 LAB — POCT INR: INR: 2.7

## 2015-04-04 ENCOUNTER — Telehealth: Payer: Self-pay | Admitting: Internal Medicine

## 2015-04-05 NOTE — Telephone Encounter (Signed)
Close encounter 

## 2015-04-25 ENCOUNTER — Ambulatory Visit: Payer: 59 | Admitting: Pharmacist Clinician (PhC)/ Clinical Pharmacy Specialist

## 2015-04-26 ENCOUNTER — Ambulatory Visit (INDEPENDENT_AMBULATORY_CARE_PROVIDER_SITE_OTHER): Payer: 59 | Admitting: Pharmacist Clinician (PhC)/ Clinical Pharmacy Specialist

## 2015-04-26 DIAGNOSIS — Z95828 Presence of other vascular implants and grafts: Secondary | ICD-10-CM

## 2015-04-26 DIAGNOSIS — Z5181 Encounter for therapeutic drug level monitoring: Secondary | ICD-10-CM

## 2015-04-26 DIAGNOSIS — I350 Nonrheumatic aortic (valve) stenosis: Secondary | ICD-10-CM

## 2015-04-26 DIAGNOSIS — Z954 Presence of other heart-valve replacement: Secondary | ICD-10-CM

## 2015-04-26 LAB — POCT INR: INR: 2.4

## 2015-04-27 LAB — NMR, LIPOPROFILE
Cholesterol: 158 mg/dL (ref 100–199)
HDL CHOLESTEROL BY NMR: 45 mg/dL (ref >39)
HDL Particle Number: 35.4 umol/L (ref >=30.5)
LDL PARTICLE NUMBER: 1138 nmol/L — AB (ref <1000)
LDL Size: 20.3 nm (ref >20.5)
LDL-C: 89 mg/dL (ref 0–99)
LP-IR Score: 75 — ABNORMAL HIGH (ref <=45)
Small LDL Particle Number: 641 nmol/L — ABNORMAL HIGH (ref <=527)
Triglycerides by NMR: 119 mg/dL (ref 0–149)

## 2015-05-04 ENCOUNTER — Encounter: Payer: Self-pay | Admitting: *Deleted

## 2015-05-04 ENCOUNTER — Encounter: Payer: Self-pay | Admitting: Internal Medicine

## 2015-05-04 ENCOUNTER — Ambulatory Visit (INDEPENDENT_AMBULATORY_CARE_PROVIDER_SITE_OTHER): Payer: 59 | Admitting: Internal Medicine

## 2015-05-04 VITALS — BP 160/90 | HR 63 | Temp 97.8°F | Resp 12 | Ht 69.0 in | Wt 180.1 lb

## 2015-05-04 DIAGNOSIS — IMO0001 Reserved for inherently not codable concepts without codable children: Secondary | ICD-10-CM

## 2015-05-04 DIAGNOSIS — Z8774 Personal history of (corrected) congenital malformations of heart and circulatory system: Secondary | ICD-10-CM

## 2015-05-04 DIAGNOSIS — Z0000 Encounter for general adult medical examination without abnormal findings: Secondary | ICD-10-CM | POA: Diagnosis not present

## 2015-05-04 DIAGNOSIS — R030 Elevated blood-pressure reading, without diagnosis of hypertension: Secondary | ICD-10-CM | POA: Diagnosis not present

## 2015-05-04 DIAGNOSIS — Z9889 Other specified postprocedural states: Secondary | ICD-10-CM

## 2015-05-04 DIAGNOSIS — Z23 Encounter for immunization: Secondary | ICD-10-CM | POA: Diagnosis not present

## 2015-05-04 DIAGNOSIS — I10 Essential (primary) hypertension: Secondary | ICD-10-CM | POA: Insufficient documentation

## 2015-05-04 DIAGNOSIS — E785 Hyperlipidemia, unspecified: Secondary | ICD-10-CM

## 2015-05-04 DIAGNOSIS — E663 Overweight: Secondary | ICD-10-CM

## 2015-05-04 NOTE — Patient Instructions (Addendum)
1) Constipation:  I recommend docusate  Dose is 100 to 200 mg daily stool softener  Available at [facility]  2) Weight gain.  Sometimes changing your diet or your workout will jump start the weight loss you want.    The  diet I discussed with you today is the 10 day Green Smoothie Cleansing /Detox Diet by Linden Dolin . available on [place] for around $10.  This is not a low carb or a weight loss diet,  It is fundamentally a "cleansing" low fat diet that eliminates sugar, gluten, caffeine, alcohol and dairy for 10 days .  What you add back after the initial ten days is entirely up to  you!  You can expect to lose 5 to 10 lbs depending on how strict you are.  I suggest drinking 2 smoothies daily and keeping one chewable meal (but keep it simple, like  fish and salad, rice or bok choy) .  You snack primarily on fresh  fruit, egg whites and judicious quantities of nuts. You can add vegetable based protein powder (nothing with whey , since whey is dairy) in it.  [employer] has a [employer] officer, political party .  It does require some form of a nutrient extractor (Vita Mix, a electric juicer,  Or a Nutribullet Rx).  i have found that using frozen fruits is much more convenient and cost effective. You can even find plenty of organic fruit in the frozen fruit section of [facility].  Just thaw what you need for the following day the night before in the refrigerator (to avoid jamming up your machine)    3) GERD:  I appreciate your concern about continuing your PPI in light of the recently published studies suggesting an association with increased risk of dementia and kidney failure.  I advise you to try switching  From your PPI to either famotidine 20 mg once or twice daily,  or to  ranitidine 150 mg once or twice daily.  These medications are  H2 blockers and are available without a prescriptions.   if your reflux symptoms are controlled,  You can Continue the daily h2 blocker.   4) Elevated BP:  Please get your BP checked a few times over  the next month and let me know the readings.  It was 144/90 today   Health Maintenance A healthy lifestyle and preventative care can promote health and wellness.  Maintain regular health, dental, and eye exams.  Eat a healthy diet. Foods like vegetables, fruits, whole grains, low-fat dairy products, and lean protein foods contain the nutrients you need and are low in calories. Decrease your intake of foods high in solid fats, added sugars, and salt. Get information about a proper diet from your health care provider, if necessary.  Regular physical exercise is one of the most important things you can do for your health. Most adults should get at least 150 minutes of moderate-intensity exercise (any activity that increases your heart rate and causes you to sweat) each week. In addition, most adults need muscle-strengthening exercises on 2 or more days a week.   Maintain a healthy weight. The body mass index (BMI) is a screening tool to identify possible weight problems. It provides an estimate of body fat based on height and weight. Your health care provider can find your BMI and can help you achieve or maintain a healthy weight. For males 20 years and older:  A BMI below 18.5 is considered underweight.  A BMI of 18.5 to 24.9 is normal.  A BMI of 25 to 29.9 is considered overweight.  A BMI of 30 and above is considered obese.  Maintain normal blood lipids and cholesterol by exercising and minimizing your intake of saturated fat. Eat a balanced diet with plenty of fruits and vegetables. Blood tests for lipids and cholesterol should begin at age 67 and be repeated every 5 years. If your lipid or cholesterol levels are high, you are over age 1, or you are at high risk for heart disease, you may need your cholesterol levels checked more frequently.Ongoing high lipid and cholesterol levels should be treated with medicines if diet and exercise are not working.  If you smoke, find out from your  health care provider how to quit. If you do not use tobacco, do not start.  Lung cancer screening is recommended for adults aged 30-80 years who are at high risk for developing lung cancer because of a history of smoking. A yearly low-dose CT scan of the lungs is recommended for people who have at least a 30-pack-year history of smoking and are current smokers or have quit within the past 15 years. A pack year of smoking is smoking an average of 1 pack of cigarettes a day for 1 year (for example, a 30-pack-year history of smoking could mean smoking 1 pack a day for 30 years or 2 packs a day for 15 years). Yearly screening should continue until the smoker has stopped smoking for at least 15 years. Yearly screening should be stopped for people who develop a health problem that would prevent them from having lung cancer treatment.  If you choose to drink alcohol, do not have more than 2 drinks per day. One drink is considered to be 12 oz (360 mL) of beer, 5 oz (150 mL) of wine, or 1.5 oz (45 mL) of liquor.  Avoid the use of street drugs. Do not share needles with anyone. Ask for help if you need support or instructions about stopping the use of drugs.  High blood pressure causes heart disease and increases the risk of stroke. Blood pressure should be checked at least every 1-2 years. Ongoing high blood pressure should be treated with medicines if weight loss and exercise are not effective.  If you are 51-88 years old, ask your health care provider if you should take aspirin to prevent heart disease.  Diabetes screening involves taking a blood sample to check your fasting blood sugar level. This should be done once every 3 years after age 64 if you are at a normal weight and without risk factors for diabetes. Testing should be considered at a younger age or be carried out more frequently if you are overweight and have at least 1 risk factor for diabetes.  Colorectal cancer can be detected and often  prevented. Most routine colorectal cancer screening begins at the age of 6 and continues through age 37. However, your health care provider may recommend screening at an earlier age if you have risk factors for colon cancer. On a yearly basis, your health care provider may provide home test kits to check for hidden blood in the stool. A small camera at the end of a tube may be used to directly examine the colon (sigmoidoscopy or colonoscopy) to detect the earliest forms of colorectal cancer. Talk to your health care provider about this at age 65 when routine screening begins. A direct exam of the colon should be repeated every 5-10 years through age 16, unless early forms of precancerous polyps  or small growths are found.  People who are at an increased risk for hepatitis B should be screened for this virus. You are considered at high risk for hepatitis B if:  You were born in a country where hepatitis B occurs often. Talk with your health care provider about which countries are considered high risk.  Your parents were born in a high-risk country and you have not received a shot to protect against hepatitis B (hepatitis B vaccine).  You have HIV or AIDS.  You use needles to inject street drugs.  You live with, or have sex with, someone who has hepatitis B.  You are a man who has sex with other men (MSM).  You get hemodialysis treatment.  You take certain medicines for conditions like cancer, organ transplantation, and autoimmune conditions.  Hepatitis C blood testing is recommended for all people born from [birth year] through 1966 and any individual with known risk factors for hepatitis C.  Healthy men should no longer receive prostate-specific antigen (PSA) blood tests as part of routine cancer screening. Talk to your health care provider about prostate cancer screening.  Testicular cancer screening is not recommended for adolescents or adult males who have no symptoms. Screening includes  self-exam, a health care provider exam, and other screening tests. Consult with your health care provider about any symptoms you have or any concerns you have about testicular cancer.  Practice safe sex. Use condoms and avoid high-risk sexual practices to reduce the spread of sexually transmitted infections (STIs).  You should be screened for STIs, including gonorrhea and chlamydia if:  You are sexually active and are younger than 24 years.  You are older than 24 years, and your health care provider tells you that you are at risk for this type of infection.  Your sexual activity has changed since you were last screened, and you are at an increased risk for chlamydia or gonorrhea. Ask your health care provider if you are at risk.  If you are at risk of being infected with HIV, it is recommended that you take a prescription medicine daily to prevent HIV infection. This is called pre-exposure prophylaxis (PrEP). You are considered at risk if:  You are a man who has sex with other men (MSM).  You are a heterosexual man who is sexually active with multiple partners.  You take drugs by injection.  You are sexually active with a partner who has HIV.  Talk with your health care provider about whether you are at high risk of being infected with HIV. If you choose to begin PrEP, you should first be tested for HIV. You should then be tested every 3 months for as long as you are taking PrEP.  Use sunscreen. Apply sunscreen liberally and repeatedly throughout the day. You should seek shade when your shadow is shorter than you. Protect yourself by wearing long sleeves, pants, a wide-brimmed hat, and sunglasses year round whenever you are outdoors.  Tell your health care provider of new moles or changes in moles, especially if there is a change in shape or color. Also, tell your health care provider if a mole is larger than the size of a pencil eraser.  A one-time screening for abdominal aortic aneurysm  (AAA) and surgical repair of large AAAs by ultrasound is recommended for men aged 51-75 years who are current or former smokers.  Stay current with your vaccines (immunizations). Document Released: 01/31/2008 Document Revised: 08/09/2013 Document Reviewed: 12/30/2010 [facility] Patient Information 2015 [place], [place].  This information is not intended to replace advice given to you by your health care provider. Make sure you discuss any questions you have with your health care provider.

## 2015-05-04 NOTE — Progress Notes (Signed)
Pre-visit discussion using our clinic review tool. No additional management support is needed unless otherwise documented below in the visit note.  

## 2015-05-04 NOTE — Progress Notes (Signed)
Patient ID: Frank [name], male    DOB: 1965/12/13  Age: 49 y.o. MRN: [id number]  The patient is here for annual wellness examination and management of other chronic and acute problems.  The risk factors are reflected in the social history.  The roster of all physicians providing medical care to patient - is listed in the Snapshot section of the chart.  Home safety : The patient has smoke detectors in the home. They wear seatbelts.  There are no firearms at home. There is no violence in the home.   There is no risks for hepatitis, STDs or HIV. There is no   history of blood transfusion. They have no travel history to infectious disease endemic areas of the world.  The patient has seen their dentist in the last six month. They have seen their eye doctor in the last year. They admit to slight hearing difficulty with regard to whispered voices and some television programs.  They have deferred audiologic testing in the last year.  They do not  have excessive sun exposure. Discussed the need for sun protection: hats, long sleeves and use of sunscreen if there is significant sun exposure.   Diet: the importance of a healthy diet is discussed. They do have a healthy diet.  The benefits of regular aerobic exercise were discussed. She walks 4 times per week ,  20 minutes.   Depression screen: there are no signs or vegative symptoms of depression- irritability, change in appetite, anhedonia, sadness/tearfullness.  The following portions of the patient's history were reviewed and updated as appropriate: allergies, current medications, past family history, past medical history,  past surgical history, past social history  and problem list.  Visual acuity was not assessed per patient preference since she has regular follow up with her ophthalmologist. Hearing and body mass index were assessed and reviewed.   During the course of the visit the patient was educated and counseled about appropriate screening  and preventive services including : fall prevention , diabetes screening, nutrition counseling, colorectal cancer screening, and recommended immunizations.    CC: The primary encounter diagnosis was Encounter for immunization. Diagnoses of Elevated BP, Hx of bicuspid aortic valve, S/P ACL repair, Encounter for preventive health examination, and Overweight (BMI 25.0-29.9) were also pertinent to this visit.  Since his last visit he underwent [employer] aortic valve replacement by Gilford Raid on November 27 2014 at [facility] for severe aortic stenosis , moderate AI and moderate MR, and repair of a 4.4 cm ascending aortic aneurysm.  He has recovered without complication, is exercising regularly,  And his anticoagulation with coumadin is managed  By the coumadin clinic. Marland Kitchen   He was prescribed Crestor in May for elevated LDL and increased particle size .  3 month NMR done and total is 153,  LDL particle size still elevated but lower,  And LDL dropped from181 to 89     Tolerating the medication without side effects  Elevated BP today, work BPs have been 134/78  History Kalil has a past medical history of Congenital heart valve abnormality; PONV (postoperative nausea and vomiting); Dysrhythmia; Hypercholesterolemia; and GERD (gastroesophageal reflux disease).   He has past surgical history that includes Inguinal hernia repair (2006); Tonsillectomy (2001); Anterior cruciate ligament repair (Right, 08/01/2014); TEE without cardioversion (N/A, 08/01/2014); coronary angiogram (10/26/2014); Cardiac catheterization; Bentall procedure (N/A, 11/27/2014); and TEE without cardioversion (N/A, 11/27/2014).   His family history includes Arthritis in his mother; Heart disease in his father.He reports that  he has never smoked. He has never used smokeless tobacco. He reports that he drinks about 4.2 oz of alcohol per week. He reports that he does not use illicit drugs.  Outpatient Prescriptions Prior to Visit  Medication Sig  Dispense Refill  . ALPRAZolam (XANAX) 0.5 MG tablet Take 1 tablet (0.5 mg total) by mouth at bedtime as needed. 90 tablet 0  . Ascorbic Acid (VITAMIN C) 1000 MG tablet Take 1,000 mg by mouth daily.    . metoprolol tartrate (LOPRESSOR) 25 MG tablet Take 1 tablet (25 mg total) by mouth 2 (two) times daily. 28 tablet 0  . omeprazole (PRILOSEC) 40 MG capsule Take 1 capsule by mouth  daily 90 capsule 1  . rosuvastatin (CRESTOR) 20 MG tablet Take 1 tablet (20 mg total) by mouth daily. 90 tablet 3  . warfarin (COUMADIN) 5 MG tablet Take 1 tablet (5 mg total) by mouth daily. Or as directed by [facility] Clinic 90 tablet 1   No facility-administered medications prior to visit.    Review of Systems   Patient denies headache, fevers, malaise, unintentional weight loss, skin rash, eye pain, sinus congestion and sinus pain, sore throat, dysphagia,  hemoptysis , cough, dyspnea, wheezing, chest pain, palpitations, orthopnea, edema, abdominal pain, nausea, melena, diarrhea, constipation, flank pain, dysuria, hematuria, urinary  Frequency, nocturia, numbness, tingling, seizures,  Focal weakness, Loss of consciousness,  Tremor, insomnia, depression, anxiety, and suicidal ideation.     Objective:  BP 160/90 mmHg  Pulse 63  Temp(Src) 97.8 F (36.6 C) (Oral)  Resp 12  Ht 5\' 9"  (1.753 m)  Wt 180 lb 2 oz (81.704 kg)  BMI 26.59 kg/m2  SpO2 99%  Physical Exam   General appearance: alert, cooperative and appears stated age Ears: normal TM's and external ear canals both ears Throat: lips, mucosa, and tongue normal; teeth and gums normal Neck: no adenopathy, no carotid bruit, supple, symmetrical, trachea midline and thyroid not enlarged, symmetric, no tenderness/mass/nodules Chest : midline sternal scar, healing well.  Back: symmetric, no curvature. ROM normal. No CVA tenderness. Lungs: clear to auscultation bilaterally Heart: regular rate and rhythm, S1, S2 normal, no murmur, click, rub or gallop Abdomen:  soft, non-tender; bowel sounds normal; no masses,  no organomegaly Pulses: 2+ and symmetric Skin: Skin color, texture, turgor normal. No rashes or lesions Lymph nodes: Cervical, supraclavicular, and axillary nodes normal.   Assessment & Plan:   Problem List Items Addressed This Visit      Unprioritized   Hx of bicuspid aortic valve    He underwent aortic valve replacement in April  after he noted a dramatically signigicant clinical status change after his knee surgery .        Encounter for preventive health examination    Annual wellness  exam was done as well as a comprehensive physical exam  .  During the course of the visit the patient was educated and counseled about appropriate screening and preventive services and screenings were brought up to date .  He is taking statin therapy for the past several months and will obtain fasting labs .   Printed recommendations for health maintenance screenings was given.        S/P ACL repair    S/p ACL repair March 2016.  He has recovered well and is exercising daily .      Elevated BP    He has no history of hypertension.  Asked to check BP several times over the next 4 weeks and report readings  for medication adjustment.       Overweight (BMI 25.0-29.9)    I have addressed  BMI and recommended a low glycemic index diet utilizing smaller more frequent meals to increase metabolism.  I       Other Visit Diagnoses    Encounter for immunization    -  Primary       I am having Mr. Quaintance maintain his omeprazole, ALPRAZolam, vitamin C, metoprolol tartrate, warfarin, and rosuvastatin.  No orders of the defined types were placed in this encounter.    There are no discontinued medications.  Follow-up: No Follow-up on file.   [name], MD

## 2015-05-06 DIAGNOSIS — E663 Overweight: Secondary | ICD-10-CM | POA: Insufficient documentation

## 2015-05-06 NOTE — Assessment & Plan Note (Signed)
S/p ACL repair March 2016.  He has recovered well and is exercising daily .

## 2015-05-06 NOTE — Assessment & Plan Note (Signed)
Annual wellness  exam was done as well as a comprehensive physical exam  .  During the course of the visit the patient was educated and counseled about appropriate screening and preventive services and screenings were brought up to date .  He is taking statin therapy for the past several months and will obtain fasting labs .   Printed recommendations for health maintenance screenings was given.

## 2015-05-06 NOTE — Assessment & Plan Note (Signed)
I have addressed  BMI and recommended a low glycemic index diet utilizing smaller more frequent meals to increase metabolism.  I

## 2015-05-06 NOTE — Assessment & Plan Note (Signed)
He underwent aortic valve replacement in April  after he noted a dramatically signigicant clinical status change after his knee surgery .

## 2015-05-06 NOTE — Assessment & Plan Note (Signed)
He has no history of hypertension.  Asked to check BP several times over the next 4 weeks and report readings for medication adjustment.

## 2015-05-07 MED ORDER — ROSUVASTATIN CALCIUM 20 MG PO TABS: 40.0000 mg | ORAL_TABLET | Freq: Every day | ORAL | Status: DC

## 2015-05-22 ENCOUNTER — Other Ambulatory Visit: Payer: Self-pay

## 2015-05-22 NOTE — Telephone Encounter (Signed)
Please advise 

## 2015-05-23 MED ORDER — ALPRAZOLAM 0.5 MG PO TABS: 0.5000 mg | ORAL_TABLET | Freq: Every evening | ORAL | Status: DC | PRN

## 2015-05-23 NOTE — Telephone Encounter (Signed)
90 day supply authorized and printed for mail order

## 2015-05-24 ENCOUNTER — Other Ambulatory Visit: Payer: Self-pay | Admitting: Internal Medicine

## 2015-05-25 LAB — MICROALBUMIN / CREATININE URINE RATIO
CREATININE, UR: 135.7 mg/dL
MICROALB/CREAT RATIO: 3.2 mg/g{creat} (ref 0.0–30.0)
Microalbumin, Urine: 4.3 ug/mL

## 2015-05-25 LAB — VITAMIN D 25 HYDROXY (VIT D DEFICIENCY, FRACTURES): Vit D, 25-Hydroxy: 31 ng/mL (ref 30.0–100.0)

## 2015-05-25 LAB — TSH: TSH: 2.95 u[IU]/mL (ref 0.450–4.500)

## 2015-05-25 LAB — PSA: Prostate Specific Ag, Serum: 0.3 ng/mL (ref 0.0–4.0)

## 2015-05-28 ENCOUNTER — Encounter: Payer: Self-pay | Admitting: Internal Medicine

## 2015-06-01 ENCOUNTER — Other Ambulatory Visit: Payer: Self-pay | Admitting: Internal Medicine

## 2015-06-01 NOTE — Telephone Encounter (Signed)
Rx(s) sent to pharmacy electronically.  

## 2015-06-06 ENCOUNTER — Ambulatory Visit (INDEPENDENT_AMBULATORY_CARE_PROVIDER_SITE_OTHER): Payer: 59 | Admitting: Pharmacist Clinician (PhC)/ Clinical Pharmacy Specialist

## 2015-06-06 DIAGNOSIS — Z95828 Presence of other vascular implants and grafts: Secondary | ICD-10-CM | POA: Diagnosis not present

## 2015-06-06 DIAGNOSIS — I350 Nonrheumatic aortic (valve) stenosis: Secondary | ICD-10-CM | POA: Diagnosis not present

## 2015-06-06 DIAGNOSIS — Z5181 Encounter for therapeutic drug level monitoring: Secondary | ICD-10-CM

## 2015-06-06 DIAGNOSIS — Z954 Presence of other heart-valve replacement: Secondary | ICD-10-CM

## 2015-06-06 LAB — POCT INR: INR: 2.1

## 2015-07-02 ENCOUNTER — Ambulatory Visit (INDEPENDENT_AMBULATORY_CARE_PROVIDER_SITE_OTHER): Payer: 59 | Admitting: Internal Medicine

## 2015-07-02 ENCOUNTER — Encounter: Payer: Self-pay | Admitting: Internal Medicine

## 2015-07-02 VITALS — BP 124/88 | HR 57 | Ht 70.0 in | Wt 183.2 lb

## 2015-07-02 DIAGNOSIS — Z954 Presence of other heart-valve replacement: Secondary | ICD-10-CM

## 2015-07-02 DIAGNOSIS — R002 Palpitations: Secondary | ICD-10-CM

## 2015-07-02 DIAGNOSIS — E785 Hyperlipidemia, unspecified: Secondary | ICD-10-CM | POA: Diagnosis not present

## 2015-07-02 DIAGNOSIS — Z79899 Other long term (current) drug therapy: Secondary | ICD-10-CM

## 2015-07-02 DIAGNOSIS — I2510 Atherosclerotic heart disease of native coronary artery without angina pectoris: Secondary | ICD-10-CM | POA: Diagnosis not present

## 2015-07-02 NOTE — Patient Instructions (Signed)
Your physician recommends that you return for lab work next month - FASTING  Your physician wants you to follow-up in: 6 months with Dr. Debara Pickett. You will receive a reminder letter in the mail two months in advance. If you don't receive a letter, please call our office to schedule the follow-up appointment.

## 2015-07-02 NOTE — Progress Notes (Signed)
OFFICE NOTE  Chief Complaint:  Routine follow-up, no complaints  Primary Care Physician: [name], MD  HPI:  Frank [name] is a pleasant 49 yo male with a history of at least moderate AS (followed by Dr. [name] at the [facility]), presented for ACL repair with Dr. [name]. Preoperatively, he was evaluated by Dr. [name] (cardiac anesthesia) and found to have a loud AS murmur- After some discussion, interoperative TEE was recommended and performed. I personally reviewed the TEE images which do demonstrate severe aortic stenosis, moderate AI and mild to moderate MR (obtained during surgery, on pressor, PPV, etc, but still significant). He was recently told he probably had another 5 years before needing to consider surgery. Mean gradient is already above 40 mmHg. Clinically, he has been asymptomatic (no chest pain, dyspnea, syncope, etc,) but recently he has had more palpitations (PVC's) and was started on b-blocker. He did manage to get through surgery uneventfully.   He returns today in follow-up to the office. He continues to deny any chest pain, worsening shortness of breath, presyncope or syncopal episodes. He has not pushed himself significantly due to his recent knee surgery. He is undergoing rehabilitation and getting stronger. A repeat echocardiogram was just performed as an outpatient. This was initially read by one of my partners and indicated mild to moderate aortic stenosis based on a mean valve gradient in the 20s. However subsequent images during the study do indicate mean gradients greater than 50 from the right upper sternal border using Pedoff probe. I therefore personally reviewed the echocardiogram images and made the appropriate corrections,  and the results are as follows:  Study Conclusions  - Left ventricle: The cavity size was normal. Wall thickness was increased in a pattern of mild LVH. Systolic function was normal. The estimated ejection fraction was  in the range of 55% to 60%. - Aortic valve: The aortic valve is bicuspid and is heavily calcified with restricted leaflet motion. Peak and mean gradients through the valve are 100 and 59 mm Hg respectively, consistent with severe AS. The calculated AVA is 0.6 cm2. There is moderate, eccentric AI. There was mild regurgitation. - Aorta: Aortic root dimension: 39 mm (ED). - Aortic root: The aortic root is mildly dilated. - Mitral valve: Calcified annulus. Mildly thickened leaflets . There was mild regurgitation. - Left atrium: LA Volume/BSA= 46.4 ml/m2. The atrium is moderately dilated.  Impressions:  - LVEF 60-65%, moderate concentric LVH, bicuspid and severely stenotic aortic valve- AVA 0.6 cm2, peak and mean gradients of 100 mmHg and 59 mmHg, respectively - LVOT diameter measured at 2.4 cm. There is moderate AI. There is mild to moderate MR with restricted anterior leaflet motion secondary to eccentric AI.  Pedoff measurements do indeed show a very high mean gradient of 59 and peak gradient of 100 mmHg. Based on a generous LVOT diameter of 2.4 cm, the calculated aortic valve gradient was 0.6 cm consistent with severe aortic stenosis.  I saw Frank [name] back in the office today. He was recently on a trip and noticed increasing shortness of breath and easy fatigue with exercise which was a new finding for him. He feels that this is symptomatic aortic stenosis and I would agree. I discussed the case further with Dr. Cyndia Bent, the cardiac thoracic surgeon that he's been seeing, and we feel that it's probably the time now to entertain valve replacement. Based on that there is also concern about aortopathy given his bicuspid valve. He underwent a  CT scan of the aorta and coronaries which showed does reveal multivessel coronary artery calcification as well as calcium deposits in the left main coronary. The aortic valve is heavily calcified and restricted in motion. There was at  least mild cardiac hypertrophy and dilatation of the left ventricle. The ascending aorta is dilated measuring 4.3-4.4 cm. There is no evidence for coarctation or enlargement of the thoracic or descending aorta.  [name] returns today for follow-up. He successfully underwent Bentall procedure with hemi-arch replacement using a 28 mm Hemashield graft under deep hypothermic circulatory arrest and Bentall Procedure ( replacement of aortic valve and aortic root with reimplantation of the coronary arteries) using a 21 mm St. Jude mechanical valve graft.  Overall he is doing exceedingly well. He continues to do exercise. He is also recovering from recent knee surgery. He denies any chest pain or worsening shortness of breath. His energy levels improved significantly. His median sternotomy is healing well. He did have some postoperative anemia however this seems to have improved and his energy level is better.  I saw Frank [name] back in the office today for follow-up. He is doing exceedingly well. He's been active and working and traveling without any restrictions. He denies any chest pain or shortness of breath. He's had a small amount of weight gain however BMI is only 26. He feels better than he has in years he says. Recently we increased his cholesterol medication as his LDL was still not treated to goal. Particle number was just over 1100. He is due for recheck of this next month. He says he does get some mild muscle soreness after exercise that does not resolve quickly which he may attribute to increase in Crestor.  PMHx:  Past Medical History  Diagnosis Date  . Congenital heart valve abnormality     bicuspid aortic valve  . PONV (postoperative nausea and vomiting)     after a tonsillectomy..none since  . Dysrhythmia     PVC's  . Hypercholesterolemia   . GERD (gastroesophageal reflux disease)     Past Surgical History  Procedure Laterality Date  . Inguinal hernia repair  2006    left, Dr. Bary Castilla    . Tonsillectomy  2001  . Anterior cruciate ligament repair Right 08/01/2014    Procedure: RECONSTRUCTION ANTERIOR CRUCIATE LIGAMENT (ACL) WITH HAMSTRING GRAFT, MENISCAL DEBRIDEMENT AS NEEDED.;  Surgeon: Meredith Pel, MD;  Location: [place];  Service: Orthopedics;  Laterality: Right;  . Tee without cardioversion N/A 08/01/2014    Procedure: TRANSESOPHAGEAL ECHOCARDIOGRAM (TEE);  Surgeon: Meredith Pel, MD;  Location: [place];  Service: Orthopedics;  Laterality: N/A;  . Coronary angiogram  10/26/2014    Procedure: CORONARY ANGIOGRAM;  Surgeon: [name], MD;  Location: [facility] CATH LAB;  Service: Cardiovascular;;  . Cardiac catheterization      10/26/2014  . Bentall procedure N/A 11/27/2014    Procedure: BENTALL PROCEDURE;  Surgeon: [name], MD;  Location: [place];  Service: Open Heart Surgery;  Laterality: N/A;  WITH CIRC ARREST  . Tee without cardioversion N/A 11/27/2014    Procedure: TRANSESOPHAGEAL ECHOCARDIOGRAM (TEE);  Surgeon: [name], MD;  Location: [place];  Service: Open Heart Surgery;  Laterality: N/A;    FAMHx:  Family History  Problem Relation Age of Onset  . Arthritis Mother     psoriatis, Crohn's   . Heart disease Father     CAD in his early 84s    SOCHx:   reports that he has  never smoked. He has never used smokeless tobacco. He reports that he drinks about 4.2 oz of alcohol per week. He reports that he does not use illicit drugs.  ALLERGIES:  Allergies  Allergen Reactions  . Statins Other (See Comments)    Muscle ache    ROS: A comprehensive review of systems was negative.  HOME MEDS: Current Outpatient Prescriptions  Medication Sig Dispense Refill  . ALPRAZolam (XANAX) 0.5 MG tablet Take 1 tablet (0.5 mg total) by mouth at bedtime as needed. 90 tablet 1  . Ascorbic Acid (VITAMIN C) 1000 MG tablet Take 1,000 mg by mouth daily.    . metoprolol tartrate (LOPRESSOR) 25 MG tablet Take 1 tablet (25 mg total) by mouth 2 (two) times daily. 28 tablet 0   . rosuvastatin (CRESTOR) 20 MG tablet Take 2 tablets (40 mg total) by mouth daily. 180 tablet 1  . warfarin (COUMADIN) 5 MG tablet Take 1 tablet (5 mg total) by mouth daily. Or as directed by [facility] 90 tablet 1   No current facility-administered medications for this visit.    LABS/IMAGING: No results found for this or any previous visit (from the past 48 hour(s)). No results found.  VITALS: BP 124/88 mmHg  Pulse 57  Ht 5\' 10"  (1.778 m)  Wt 183 lb 3 oz (83.093 kg)  BMI 26.28 kg/m2  EXAM: General appearance: alert and no distress Neck: no carotid bruit and no JVD Lungs: clear to auscultation bilaterally Heart: regular rate and rhythm, S1, S2 normal, no murmur, click, rub or gallop and Sharp mechanical valve sounds Abdomen: soft, non-tender; bowel sounds normal; no masses,  no organomegaly Extremities: extremities normal, atraumatic, no cyanosis or edema Pulses: 2+ and symmetric Skin: Skin color, texture, turgor normal. No rashes or lesions Neurologic: Grossly normal Psych: Pleasant  EKG: Normal sinus rhythm at 57  ASSESSMENT: 1. Severe calcific aortic stenosis of a bicuspid valve-s/p 21 mm St. Jude AVR 2. Mild to moderate mitral regurgitation 3. Preserved LVEF of 60-65% 4. Moderate left atrial enlargement 5. Multivessel and left main coronary calcium 6. Thoracic aortic aneurysm - s/p Hemashield grafting/hemiarch replacement 7. Dyslipidemia  PLAN: 1.   Mr. Ries is doing very well. He feels like he's gotten his life back. Recently I increased his Crestor to 40 mg daily and he is due for recheck of that next month. We'll also check a cemented and CK. I've encouraged him to continue on his current medications. His warfarin levels have been therapeutic in very well controlled. He is also on Lopressor twice daily. He was concerned about weight gain with this medication, however while this is feasible the benefits are much greater than the side effects. Overall he  continues to do well encouraged ongoing exercise. Plan to see him back in 6 months.  [name], MD, [facility] Attending Cardiologist [place] C Hilty 07/02/2015, 9:02 AM

## 2015-07-18 ENCOUNTER — Ambulatory Visit (INDEPENDENT_AMBULATORY_CARE_PROVIDER_SITE_OTHER): Payer: 59 | Admitting: Pharmacist Clinician (PhC)/ Clinical Pharmacy Specialist

## 2015-07-18 DIAGNOSIS — Z95828 Presence of other vascular implants and grafts: Secondary | ICD-10-CM | POA: Diagnosis not present

## 2015-07-18 DIAGNOSIS — Z954 Presence of other heart-valve replacement: Secondary | ICD-10-CM | POA: Diagnosis not present

## 2015-07-18 DIAGNOSIS — I350 Nonrheumatic aortic (valve) stenosis: Secondary | ICD-10-CM | POA: Diagnosis not present

## 2015-07-18 DIAGNOSIS — Z5181 Encounter for therapeutic drug level monitoring: Secondary | ICD-10-CM | POA: Diagnosis not present

## 2015-07-18 LAB — POCT INR: INR: 2.7

## 2015-08-02 LAB — COMPREHENSIVE METABOLIC PANEL
A/G RATIO: 1.7 (ref 1.1–2.5)
ALBUMIN: 4.5 g/dL (ref 3.5–5.5)
ALT: 63 IU/L — AB (ref 0–44)
AST: 51 IU/L — ABNORMAL HIGH (ref 0–40)
Alkaline Phosphatase: 55 IU/L (ref 39–117)
BILIRUBIN TOTAL: 0.4 mg/dL (ref 0.0–1.2)
BUN / CREAT RATIO: 14 (ref 9–20)
BUN: 18 mg/dL (ref 6–24)
CALCIUM: 9.8 mg/dL (ref 8.7–10.2)
CO2: 22 mmol/L (ref 18–29)
Chloride: 101 mmol/L (ref 96–106)
Creatinine, Ser: 1.26 mg/dL (ref 0.76–1.27)
GFR, EST AFRICAN AMERICAN: 76 mL/min/{1.73_m2} (ref >59)
GFR, EST NON AFRICAN AMERICAN: 66 mL/min/{1.73_m2} (ref >59)
Globulin, Total: 2.6 g/dL (ref 1.5–4.5)
Glucose: 92 mg/dL (ref 65–99)
Potassium: 5 mmol/L (ref 3.5–5.2)
Sodium: 139 mmol/L (ref 134–144)
TOTAL PROTEIN: 7.1 g/dL (ref 6.0–8.5)

## 2015-08-02 LAB — NMR, LIPOPROFILE
Cholesterol: 144 mg/dL (ref 100–199)
HDL Cholesterol by NMR: 45 mg/dL (ref >39)
HDL PARTICLE NUMBER: 36.7 umol/L (ref >=30.5)
LDL Particle Number: 955 nmol/L (ref <1000)
LDL SIZE: 20.3 nm (ref >20.5)
LDL-C: 78 mg/dL (ref 0–99)
LP-IR Score: 66 — ABNORMAL HIGH (ref <=45)
SMALL LDL PARTICLE NUMBER: 484 nmol/L (ref <=527)
Triglycerides by NMR: 106 mg/dL (ref 0–149)

## 2015-08-02 LAB — CK: CK TOTAL: 222 U/L — AB (ref 24–204)

## 2015-08-10 ENCOUNTER — Encounter: Payer: Self-pay | Admitting: Internal Medicine

## 2015-08-21 ENCOUNTER — Other Ambulatory Visit: Payer: Self-pay | Admitting: Internal Medicine

## 2015-08-21 NOTE — Telephone Encounter (Signed)
Rx request sent to pharmacy.  

## 2015-08-29 ENCOUNTER — Encounter: Payer: 59 | Admitting: Pharmacist Clinician (PhC)/ Clinical Pharmacy Specialist

## 2015-08-31 ENCOUNTER — Ambulatory Visit (INDEPENDENT_AMBULATORY_CARE_PROVIDER_SITE_OTHER): Payer: 59 | Admitting: Pharmacist Clinician (PhC)/ Clinical Pharmacy Specialist

## 2015-08-31 DIAGNOSIS — I350 Nonrheumatic aortic (valve) stenosis: Secondary | ICD-10-CM

## 2015-08-31 DIAGNOSIS — Z5181 Encounter for therapeutic drug level monitoring: Secondary | ICD-10-CM

## 2015-08-31 DIAGNOSIS — Z954 Presence of other heart-valve replacement: Secondary | ICD-10-CM | POA: Diagnosis not present

## 2015-08-31 DIAGNOSIS — Z95828 Presence of other vascular implants and grafts: Secondary | ICD-10-CM

## 2015-08-31 LAB — POCT INR: INR: 1.7

## 2015-09-19 ENCOUNTER — Ambulatory Visit (INDEPENDENT_AMBULATORY_CARE_PROVIDER_SITE_OTHER): Payer: 59 | Admitting: Pharmacist Clinician (PhC)/ Clinical Pharmacy Specialist

## 2015-09-19 DIAGNOSIS — Z954 Presence of other heart-valve replacement: Secondary | ICD-10-CM | POA: Diagnosis not present

## 2015-09-19 DIAGNOSIS — Z95828 Presence of other vascular implants and grafts: Secondary | ICD-10-CM

## 2015-09-19 DIAGNOSIS — I350 Nonrheumatic aortic (valve) stenosis: Secondary | ICD-10-CM

## 2015-09-19 DIAGNOSIS — Z5181 Encounter for therapeutic drug level monitoring: Secondary | ICD-10-CM

## 2015-09-19 LAB — POCT INR: INR: 2.1

## 2015-10-15 ENCOUNTER — Encounter: Payer: Self-pay | Admitting: Internal Medicine

## 2015-10-17 ENCOUNTER — Encounter: Payer: 59 | Admitting: Pharmacist Clinician (PhC)/ Clinical Pharmacy Specialist

## 2015-10-17 ENCOUNTER — Other Ambulatory Visit: Payer: Self-pay | Admitting: Pharmacist Clinician (PhC)/ Clinical Pharmacy Specialist

## 2015-10-17 DIAGNOSIS — Z952 Presence of prosthetic heart valve: Secondary | ICD-10-CM

## 2015-10-17 NOTE — Telephone Encounter (Signed)
Pt wife works for [employer], put in standing order and mail to patient

## 2015-10-29 ENCOUNTER — Other Ambulatory Visit: Payer: Self-pay | Admitting: Internal Medicine

## 2015-10-30 LAB — PROTIME-INR
INR: 2 — AB (ref 0.8–1.2)
PROTHROMBIN TIME: 20.7 s — AB (ref 9.1–12.0)

## 2015-11-14 ENCOUNTER — Ambulatory Visit (INDEPENDENT_AMBULATORY_CARE_PROVIDER_SITE_OTHER): Payer: 59 | Admitting: Internal Medicine

## 2015-11-14 ENCOUNTER — Encounter: Payer: Self-pay | Admitting: Internal Medicine

## 2015-11-14 VITALS — BP 152/100 | HR 67 | Temp 98.0°F | Resp 12 | Ht 70.0 in | Wt 186.5 lb

## 2015-11-14 DIAGNOSIS — I10 Essential (primary) hypertension: Secondary | ICD-10-CM | POA: Diagnosis not present

## 2015-11-14 DIAGNOSIS — H8103 Meniere's disease, bilateral: Secondary | ICD-10-CM

## 2015-11-14 DIAGNOSIS — H81393 Other peripheral vertigo, bilateral: Secondary | ICD-10-CM | POA: Diagnosis not present

## 2015-11-14 MED ORDER — LOSARTAN POTASSIUM 50 MG PO TABS: 50.0000 mg | ORAL_TABLET | Freq: Every day | ORAL | Status: DC

## 2015-11-14 NOTE — Patient Instructions (Addendum)
For your vertigo:  Let's treat seasonal rihinitis and inner ear issues "   generic zyrtec, which is cetirizine.  Allegra is available generically as fexofenadine and it comes in 60 mg and 180 mg once daily strengths.  The claritin is also available generically as loratidine .   Flonase twice daily  Give this regiem 2-3 weeks  If no change.  ENT referral for audiology  Losartan 50 mg daily for BP.  If BP is still > 130/80 after one week,  Ok to increase to 100 mg daily   Get fasting labs in one week

## 2015-11-14 NOTE — Progress Notes (Signed)
Subjective:  Patient ID: Frank [name], male    DOB: 10-Nov-1965  Age: 50 y.o. MRN: [id number]  CC: The primary encounter diagnosis was Essential hypertension. A diagnosis of Vertigo, labyrinthine, bilateral was also pertinent to this visit.  HPI [name] presents for RECURRENT EPISODES OF VERTIGO LASTING 5 MINUTES OR LESS ,  POSITIONAL,  BUT  NEVER OCCURRING WITH EXERCISE.    SOME RINGING IN THE EARS.   HAS been having rhinitis and congestion for several weeks.   2) elevated blood pressure  Outpatient Prescriptions Prior to Visit  Medication Sig Dispense Refill  . ALPRAZolam (XANAX) 0.5 MG tablet Take 1 tablet (0.5 mg total) by mouth at bedtime as needed. 90 tablet 1  . Ascorbic Acid (VITAMIN C) 1000 MG tablet Take 1,000 mg by mouth daily.    . metoprolol tartrate (LOPRESSOR) 25 MG tablet Take 1 tablet by mouth two  times daily 180 tablet 1  . rosuvastatin (CRESTOR) 20 MG tablet Take 2 tablets (40 mg total) by mouth daily. 180 tablet 1  . warfarin (COUMADIN) 5 MG tablet TAKE 1 TABLET BY MOUTH  DAILY. OR AS DIRECTED BY  [facility]OUMADIN CLINIC 90 tablet 1   No facility-administered medications prior to visit.    Review of Systems;  Patient denies headache, fevers, malaise, unintentional weight loss, skin rash, eye pain, sinus congestion and sinus pain, sore throat, dysphagia,  hemoptysis , cough, dyspnea, wheezing, chest pain, palpitations, orthopnea, edema, abdominal pain, nausea, melena, diarrhea, constipation, flank pain, dysuria, hematuria, urinary  Frequency, nocturia, numbness, tingling, seizures,  Focal weakness, Loss of consciousness,  Tremor, insomnia, depression, anxiety, and suicidal ideation.      Objective:  BP 152/100 mmHg  Pulse 67  Temp(Src) 98 F (36.7 C) (Oral)  Resp 12  Ht 5\' 10"  (1.778 m)  Wt 186 lb 8 oz (84.596 kg)  BMI 26.76 kg/m2  SpO2 99%  BP Readings from Last 3 Encounters:  11/14/15 152/100  07/02/15 124/88  05/04/15 160/90    Wt Readings from  Last 3 Encounters:  11/14/15 186 lb 8 oz (84.596 kg)  07/02/15 183 lb 3 oz (83.093 kg)  05/04/15 180 lb 2 oz (81.704 kg)    General appearance: alert, cooperative and appears stated age Ears: normal TM's and external ear canals both ears Throat: lips, mucosa, and tongue normal; teeth and gums normal Neck: no adenopathy, no carotid bruit, supple, symmetrical, trachea midline and thyroid not enlarged, symmetric, no tenderness/mass/nodules Back: symmetric, no curvature. ROM normal. No CVA tenderness. Lungs: clear to auscultation bilaterally Heart: regular rate and rhythm, S1, S2 normal, no murmur, click, rub or gallop Abdomen: soft, non-tender; bowel sounds normal; no masses,  no organomegaly Pulses: 2+ and symmetric Skin: Skin color, texture, turgor normal. No rashes or lesions Lymph nodes: Cervical, supraclavicular, and axillary nodes normal. Neuro: CNs 2-12 intact. DTRs 2+/4 in biceps, brachioradialis, patellars and achilles. Muscle strength 5/5 in upper and lower exremities.  cerebellar function normal. Romberg negative.  No pronator drift.   Gait normal.    Lab Results  Component Value Date   HGBA1C 5.8* 11/23/2014    Lab Results  Component Value Date   CREATININE 1.26 08/01/2015   CREATININE 1.16 12/29/2014   CREATININE 1.15 11/29/2014    Lab Results  Component Value Date   WBC 7.3 12/29/2014   HGB 8.8* 12/01/2014   HCT 40.6 12/29/2014   PLT 283 12/29/2014   GLUCOSE 92 08/01/2015   CHOL 144 08/01/2015   TRIG 106 08/01/2015  HDL 45 08/01/2015   LDLCALC 175 05/12/2014   ALT 63* 08/01/2015   AST 51* 08/01/2015   NA 139 08/01/2015   K 5.0 08/01/2015   CL 101 08/01/2015   CREATININE 1.26 08/01/2015   BUN 18 08/01/2015   CO2 22 08/01/2015   TSH 2.950 05/24/2015   PSA 0.4 04/28/2012   INR 2.0* 10/29/2015   HGBA1C 5.8* 11/23/2014     Assessment & Plan:   Problem List Items Addressed This Visit    Hypertension - Primary    Staring losartan 50 mg daily .   Advised to have renal function and potassium in  one week       Relevant Medications   losartan (COZAAR) 50 MG tablet   Vertigo, labyrinthine    Secondary to Eustachian tube dysfunction .  Adding flonase and antihistamine          I am having Mr. Crocker start on losartan. I am also having him maintain his vitamin C, rosuvastatin, ALPRAZolam, warfarin, and metoprolol tartrate.  Meds ordered this encounter  Medications  . losartan (COZAAR) 50 MG tablet    Sig: Take 1 tablet (50 mg total) by mouth daily.    Dispense:  30 tablet    Refill:  0    There are no discontinued medications.  Follow-up: No Follow-up on file.   Frank Mc, MD

## 2015-11-14 NOTE — Progress Notes (Signed)
Pre-visit discussion using our clinic review tool. No additional management support is needed unless otherwise documented below in the visit note.  

## 2015-11-16 DIAGNOSIS — H8109 Meniere's disease, unspecified ear: Secondary | ICD-10-CM | POA: Insufficient documentation

## 2015-11-16 NOTE — Assessment & Plan Note (Signed)
Secondary to Eustachian tube dysfunction .  Adding flonase and antihistamine

## 2015-11-16 NOTE — Assessment & Plan Note (Addendum)
Staring losartan 50 mg daily .  Advised to have renal function and potassium in  one week

## 2015-11-26 ENCOUNTER — Other Ambulatory Visit: Payer: Self-pay | Admitting: Internal Medicine

## 2015-11-27 ENCOUNTER — Other Ambulatory Visit: Payer: Self-pay | Admitting: Internal Medicine

## 2015-11-27 LAB — PROTIME-INR
INR: 2.4 — ABNORMAL HIGH (ref 0.8–1.2)
Prothrombin Time: 24.6 s — ABNORMAL HIGH (ref 9.1–12.0)

## 2015-11-28 LAB — COMPREHENSIVE METABOLIC PANEL
A/G RATIO: 1.7 (ref 1.2–2.2)
ALT: 31 IU/L (ref 0–44)
AST: 37 IU/L (ref 0–40)
Albumin: 4.7 g/dL (ref 3.5–5.5)
Alkaline Phosphatase: 57 IU/L (ref 39–117)
BILIRUBIN TOTAL: 0.4 mg/dL (ref 0.0–1.2)
BUN/Creatinine Ratio: 13 (ref 9–20)
BUN: 17 mg/dL (ref 6–24)
CALCIUM: 9.9 mg/dL (ref 8.7–10.2)
CHLORIDE: 99 mmol/L (ref 96–106)
CO2: 21 mmol/L (ref 18–29)
Creatinine, Ser: 1.29 mg/dL — ABNORMAL HIGH (ref 0.76–1.27)
GFR, EST AFRICAN AMERICAN: 74 mL/min/{1.73_m2} (ref >59)
GFR, EST NON AFRICAN AMERICAN: 64 mL/min/{1.73_m2} (ref >59)
GLOBULIN, TOTAL: 2.7 g/dL (ref 1.5–4.5)
Glucose: 94 mg/dL (ref 65–99)
POTASSIUM: 4.9 mmol/L (ref 3.5–5.2)
SODIUM: 139 mmol/L (ref 134–144)
Total Protein: 7.4 g/dL (ref 6.0–8.5)

## 2015-11-28 LAB — LIPID PANEL W/O CHOL/HDL RATIO
Cholesterol, Total: 155 mg/dL (ref 100–199)
HDL: 44 mg/dL (ref >39)
LDL Calculated: 82 mg/dL (ref 0–99)
TRIGLYCERIDES: 143 mg/dL (ref 0–149)
VLDL Cholesterol Cal: 29 mg/dL (ref 5–40)

## 2015-12-02 ENCOUNTER — Encounter: Payer: Self-pay | Admitting: Internal Medicine

## 2015-12-07 ENCOUNTER — Other Ambulatory Visit: Payer: Self-pay | Admitting: Internal Medicine

## 2015-12-07 MED ORDER — LOSARTAN POTASSIUM 50 MG PO TABS: 50.0000 mg | ORAL_TABLET | Freq: Every day | ORAL | Status: DC

## 2016-01-04 ENCOUNTER — Other Ambulatory Visit: Payer: Self-pay | Admitting: Internal Medicine

## 2016-01-05 LAB — PROTIME-INR
INR: 2.4 — AB (ref 0.8–1.2)
PROTHROMBIN TIME: 24.2 s — AB (ref 9.1–12.0)

## 2016-01-16 ENCOUNTER — Encounter: Payer: Self-pay | Admitting: Internal Medicine

## 2016-01-16 ENCOUNTER — Ambulatory Visit (INDEPENDENT_AMBULATORY_CARE_PROVIDER_SITE_OTHER): Payer: 59 | Admitting: Internal Medicine

## 2016-01-16 VITALS — BP 130/90 | HR 67 | Ht 70.0 in | Wt 187.0 lb

## 2016-01-16 DIAGNOSIS — Z954 Presence of other heart-valve replacement: Secondary | ICD-10-CM | POA: Diagnosis not present

## 2016-01-16 DIAGNOSIS — Z8774 Personal history of (corrected) congenital malformations of heart and circulatory system: Secondary | ICD-10-CM

## 2016-01-16 DIAGNOSIS — E785 Hyperlipidemia, unspecified: Secondary | ICD-10-CM

## 2016-01-16 DIAGNOSIS — I2510 Atherosclerotic heart disease of native coronary artery without angina pectoris: Secondary | ICD-10-CM | POA: Diagnosis not present

## 2016-01-16 DIAGNOSIS — Z952 Presence of prosthetic heart valve: Secondary | ICD-10-CM | POA: Insufficient documentation

## 2016-01-16 NOTE — Patient Instructions (Signed)
Your physician has requested that you have an echocardiogram @ [address]. Echocardiography is a painless test that uses sound waves to create images of your heart. It provides your doctor with information about the size and shape of your heart and how well your heart's chambers and valves are working. This procedure takes approximately one hour. There are no restrictions for this procedure.   Your physician wants you to follow-up in: 6 months with Dr. Hilty. You will receive a reminder letter in the mail two months in advance. If you don't receive a letter, please call our office to schedule the follow-up appointment.  

## 2016-01-16 NOTE — Progress Notes (Signed)
Cardiology Office Note    Date:  01/16/2016   ID:  QUADE OCANAS, DOB 1966/04/02, MRN [id number]  PCP:  [name], MD  Cardiologist:   [name], MD   Chief Complaint  Patient presents with  . 6 MONTHS    Patient has no complaints.    History of Present Illness:  Frank [name] is a 50 y.o. male with a history of at least moderate AS (followed by Dr. [name] at the [facility]), presented for ACL repair with Dr. [name]. Preoperatively, he was evaluated by Dr. [name] (cardiac anesthesia) and found to have a loud AS murmur- After some discussion, interoperative TEE was recommended and performed. I personally reviewed the TEE images which do demonstrate severe aortic stenosis, moderate AI and mild to moderate MR (obtained during surgery, on pressor, PPV, etc, but still significant). He was recently told he probably had another 5 years before needing to consider surgery. Mean gradient is already above 40 mmHg. Clinically, he has been asymptomatic (no chest pain, dyspnea, syncope, etc,) but recently he has had more palpitations (PVC's) and was started on b-blocker. He did manage to get through surgery uneventfully.   He returns today in follow-up to the office. He continues to deny any chest pain, worsening shortness of breath, presyncope or syncopal episodes. He has not pushed himself significantly due to his recent knee surgery. He is undergoing rehabilitation and getting stronger. A repeat echocardiogram was just performed as an outpatient. This was initially read by one of my partners and indicated mild to moderate aortic stenosis based on a mean valve gradient in the 20s. However subsequent images during the study do indicate mean gradients greater than 50 from the right upper sternal border using Pedoff probe. I therefore personally reviewed the echocardiogram images and made the appropriate corrections, and the results are as follows:  Study Conclusions  - Left  ventricle: The cavity size was normal. Wall thickness was increased in a pattern of mild LVH. Systolic function was normal. The estimated ejection fraction was in the range of 55% to 60%. - Aortic valve: The aortic valve is bicuspid and is heavily calcified with restricted leaflet motion. Peak and mean gradients through the valve are 100 and 59 mm Hg respectively, consistent with severe AS. The calculated AVA is 0.6 cm2. There is moderate, eccentric AI. There was mild regurgitation. - Aorta: Aortic root dimension: 39 mm (ED). - Aortic root: The aortic root is mildly dilated. - Mitral valve: Calcified annulus. Mildly thickened leaflets . There was mild regurgitation. - Left atrium: LA Volume/BSA= 46.4 ml/m2. The atrium is moderately dilated.  Impressions:  - LVEF 60-65%, moderate concentric LVH, bicuspid and severely stenotic aortic valve- AVA 0.6 cm2, peak and mean gradients of 100 mmHg and 59 mmHg, respectively - LVOT diameter measured at 2.4 cm. There is moderate AI. There is mild to moderate MR with restricted anterior leaflet motion secondary to eccentric AI.  Pedoff measurements do indeed show a very high mean gradient of 59 and peak gradient of 100 mmHg. Based on a generous LVOT diameter of 2.4 cm, the calculated aortic valve gradient was 0.6 cm consistent with severe aortic stenosis.  I saw Frank [name] back in the office today. He was recently on a trip and noticed increasing shortness of breath and easy fatigue with exercise which was a new finding for him. He feels that this is symptomatic aortic stenosis and I would agree. I discussed the case further with Dr.  Bartle, the cardiac thoracic surgeon that he's been seeing, and we feel that it's probably the time now to entertain valve replacement. Based on that there is also concern about aortopathy given his bicuspid valve. He underwent a CT scan of the aorta and coronaries which showed does reveal  multivessel coronary artery calcification as well as calcium deposits in the left main coronary. The aortic valve is heavily calcified and restricted in motion. There was at least mild cardiac hypertrophy and dilatation of the left ventricle. The ascending aorta is dilated measuring 4.3-4.4 cm. There is no evidence for coarctation or enlargement of the thoracic or descending aorta.  Frank [name] returns today for follow-up. He successfully underwent Bentall procedure with hemi-arch replacement using a 28 mm Hemashield graft under deep hypothermic circulatory arrest and Bentall Procedure ( replacement of aortic valve and aortic root with reimplantation of the coronary arteries) using a 21 mm St. Jude mechanical valve graft. Overall he is doing exceedingly well. He continues to do exercise. He is also recovering from recent knee surgery. He denies any chest pain or worsening shortness of breath. His energy levels improved significantly. His median sternotomy is healing well. He did have some postoperative anemia however this seems to have improved and his energy level is better.  I saw Frank [name] back in the office today for follow-up. He is doing exceedingly well. He's been active and working and traveling without any restrictions. He denies any chest pain or shortness of breath. He's had a small amount of weight gain however BMI is only 26. He feels better than he has in years he says. Recently we increased his cholesterol medication as his LDL was still not treated to goal. Particle number was just over 1100. He is due for recheck of this next month. He says he does get some mild muscle soreness after exercise that does not resolve quickly which he may attribute to increase in Crestor.  01/16/2016  Frank [name] returns today for follow-up. He reports that he is doing exceedingly well. His last use been excellent for him. He is exercising more regularly. He's had a small amount of weight gain but is less than 5  pounds and is starting to come down. He denies any chest pain or shortness of breath. His blood pressure is slightly higher recently however and he was started on losartan 50 mg daily. Initial blood pressure today was 130/90 however recheck was 110/80 in the left arm and 112/82 in the right arm. He was concerned about some differential blood pressures in the past however this difference is negligible. In discussion today we realized he had not had an echocardiogram since his valve surgery and we need to establish a baseline valve gradient.  Past Medical History  Diagnosis Date  . Congenital heart valve abnormality     bicuspid aortic valve  . PONV (postoperative nausea and vomiting)     after a tonsillectomy..none since  . Dysrhythmia     PVC's  . Hypercholesterolemia   . GERD (gastroesophageal reflux disease)     Past Surgical History  Procedure Laterality Date  . Inguinal hernia repair  2006    left, Dr. Bary Castilla  . Tonsillectomy  2001  . Anterior cruciate ligament repair Right 08/01/2014    Procedure: RECONSTRUCTION ANTERIOR CRUCIATE LIGAMENT (ACL) WITH HAMSTRING GRAFT, MENISCAL DEBRIDEMENT AS NEEDED.;  Surgeon: Meredith Pel, MD;  Location: [place];  Service: Orthopedics;  Laterality: Right;  . Tee without cardioversion N/A 08/01/2014    Procedure:  TRANSESOPHAGEAL ECHOCARDIOGRAM (TEE);  Surgeon: Meredith Pel, MD;  Location: [place];  Service: Orthopedics;  Laterality: N/A;  . Coronary angiogram  10/26/2014    Procedure: CORONARY ANGIOGRAM;  Surgeon: [name], MD;  Location: [facility] C[facility] LAB;  Service: Cardiovascular;;  . Cardiac catheterization      10/26/2014  . Bentall procedure N/A 11/27/2014    Procedure: BENTALL PROCEDURE;  Surgeon: [name], MD;  Location: [place];  Service: Open Heart Surgery;  Laterality: N/A;  WITH CIRC ARREST  . Tee without cardioversion N/A 11/27/2014    Procedure: TRANSESOPHAGEAL ECHOCARDIOGRAM (TEE);  Surgeon: [name], MD;  Location: [place];  Service: Open Heart Surgery;  Laterality: N/A;    Current Medications: Outpatient Prescriptions Prior to Visit  Medication Sig Dispense Refill  . ALPRAZolam (XANAX) 0.5 MG tablet Take 1 tablet (0.5 mg total) by mouth at bedtime as needed. 90 tablet 1  . Ascorbic Acid (VITAMIN C) 1000 MG tablet Take 1,000 mg by mouth daily.    Marland Kitchen losartan (COZAAR) 50 MG tablet Take 1 tablet (50 mg total) by mouth daily. 90 tablet 3  . metoprolol tartrate (LOPRESSOR) 25 MG tablet Take 1 tablet by mouth two  times daily 180 tablet 1  . rosuvastatin (CRESTOR) 20 MG tablet Take 2 tablets (40 mg total) by mouth daily. 180 tablet 1  . warfarin (COUMADIN) 5 MG tablet TAKE 1 TABLET BY MOUTH  DAILY. OR AS DIRECTED BY  [facility] CLIN[facility] 90 tablet 1   No facility-administered medications prior to visit.     Allergies:   Review of patient's allergies indicates no known allergies.   Social History   Social History  . Marital Status: Married    Spouse Name: N/A  . Number of Children: N/A  . Years of Education: N/A   Occupational History  . 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 Social research officer, government    Social History Main Topics  . Smoking status: Never Smoker   . Smokeless tobacco: Never Used  . Alcohol Use: 4.2 oz/week    7 Glasses of wine per week     Comment: occasional, drink 1 beer per wk  . Drug Use: No  . Sexual Activity: Yes   Other Topics Concern  . None   Social History Narrative   Pt has a dog.   Exercises regularly- isometrics, no running.     Family History:  The patient's family history includes Arthritis in his mother; Heart disease in his father.   ROS:   Please see the history of present illness.    ROS All other systems reviewed and are negative.   PHYSICAL EXAM:   VS:  BP 130/90 mmHg  Pulse 67  Ht 5\' 10"  (1.778 m)  Wt 187 lb (84.823 kg)  BMI 26.83 kg/m2   GEN: Well nourished, well developed, in no acute distress HEENT: normal Neck: no JVD, carotid  bruits, or masses Cardiac: RRR; mechanical heart valve sound, no murmurs, rubs, or gallops,no edema  Respiratory:  clear to auscultation bilaterally, normal work of breathing GI: soft, nontender, nondistended, + BS MS: no deformity or atrophy Skin: warm and dry, no rash Neuro:  Alert and Oriented x 3, Strength and sensation are intact Psych: euthymic mood, full affect  Wt Readings from Last 3 Encounters:  01/16/16 187 lb (84.823 kg)  11/14/15 186 lb 8 oz (84.596 kg)  07/02/15 183 lb 3 oz (83.093 kg)  Studies/Labs Reviewed:   EKG:  EKG is ordered today.  The ekg ordered today demonstrates Normal sinus rhythm with lateral T-wave changes that are improved at 67  Recent Labs: 05/24/2015: TSH 2.950 11/27/2015: ALT 31; BUN 17; Creatinine, Ser 1.29*; Potassium 4.9; Sodium 139   Lipid Panel    Component Value Date/Time   CHOL 155 11/27/2015 0735   CHOL 144 08/01/2015 0806   TRIG 143 11/27/2015 0735   TRIG 106 08/01/2015 0806   HDL 44 11/27/2015 0735   HDL 45 08/01/2015 0806   HDL 40 05/12/2014   LDLCALC 82 11/27/2015 0735   LDLCALC 175 05/12/2014    Additional studies/ records that were reviewed today include:  None    ASSESSMENT:    1. S/P AVR   2. S/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   3. CAD in native artery   4. Dyslipidemia   5. Hx of bicuspid aortic valve      PLAN:  In order of problems listed above:  1. Frank [name] is overdue for an echocardiogram. We will obtain echo to establish a baseline gradient after his aortic valve replacement last year. He is doing very well and exercising regularly. Blood pressure is improved with the recent addition of losartan to his regimen. I would plan to continue that at its current dose. We'll see him back in 6 months.   Medication Adjustments/Labs and Tests Ordered: Current medicines are reviewed at length with the patient today.  Concerns regarding medicines are outlined above.  Medication changes, Labs and  Tests ordered today are listed in the Patient Instructions below. Patient Instructions  Your physician has requested that you have an echocardiogram @ [address]. Echocardiography is a painless test that uses sound waves to create images of your heart. It provides your doctor with information about the size and shape of your heart and how well your heart's chambers and valves are working. This procedure takes approximately one hour. There are no restrictions for this procedure.  Your physician wants you to follow-up in: 6 months with Dr. Debara Pickett. You will receive a reminder letter in the mail two months in advance. If you don't receive a letter, please call our office to schedule the follow-up appointment.     [name], MD, [facility] Attending Cardiologist [place], MD  01/16/2016 5:15 PM

## 2016-02-04 ENCOUNTER — Other Ambulatory Visit: Payer: Self-pay

## 2016-02-04 ENCOUNTER — Ambulatory Visit (HOSPITAL_COMMUNITY): Payer: 59 | Attending: Internal Medicine

## 2016-02-04 DIAGNOSIS — Z954 Presence of other heart-valve replacement: Secondary | ICD-10-CM

## 2016-02-04 DIAGNOSIS — I351 Nonrheumatic aortic (valve) insufficiency: Secondary | ICD-10-CM | POA: Insufficient documentation

## 2016-02-04 DIAGNOSIS — I517 Cardiomegaly: Secondary | ICD-10-CM | POA: Diagnosis not present

## 2016-02-04 DIAGNOSIS — I359 Nonrheumatic aortic valve disorder, unspecified: Secondary | ICD-10-CM | POA: Diagnosis present

## 2016-02-04 DIAGNOSIS — Z952 Presence of prosthetic heart valve: Secondary | ICD-10-CM | POA: Insufficient documentation

## 2016-02-04 DIAGNOSIS — E785 Hyperlipidemia, unspecified: Secondary | ICD-10-CM | POA: Diagnosis not present

## 2016-02-12 ENCOUNTER — Other Ambulatory Visit: Payer: Self-pay | Admitting: Internal Medicine

## 2016-02-12 LAB — PROTIME-INR
INR: 1.9 — ABNORMAL HIGH (ref 0.8–1.2)
PROTHROMBIN TIME: 19.2 s — AB (ref 9.1–12.0)

## 2016-02-15 ENCOUNTER — Other Ambulatory Visit: Payer: Self-pay | Admitting: Internal Medicine

## 2016-02-18 ENCOUNTER — Telehealth: Payer: Self-pay

## 2016-02-18 MED ORDER — ALPRAZOLAM 0.5 MG PO TABS: 0.5000 mg | ORAL_TABLET | Freq: Every evening | ORAL | Status: DC | PRN

## 2016-02-18 NOTE — Telephone Encounter (Signed)
30 days max  with refills

## 2016-02-18 NOTE — Telephone Encounter (Signed)
Refill authorized.

## 2016-02-18 NOTE — Addendum Note (Signed)
Addended by: [name] on: 02/18/2016 07:34 PM   Modules accepted: Orders

## 2016-02-18 NOTE — Telephone Encounter (Signed)
Refill request for Xanax, last seen [id number], last filled 5OCT2016.  Please advise.

## 2016-02-20 ENCOUNTER — Ambulatory Visit (INDEPENDENT_AMBULATORY_CARE_PROVIDER_SITE_OTHER): Payer: 59 | Admitting: Pharmacist Clinician (PhC)/ Clinical Pharmacy Specialist

## 2016-02-20 DIAGNOSIS — Z954 Presence of other heart-valve replacement: Secondary | ICD-10-CM

## 2016-03-04 IMAGING — CR DG CHEST 2V
2 series · 2 of 2 positions shown · non-contrast
Comparison: CT 10/06/2014.  No prior radiographs available.

CLINICAL DATA: Congenital heart valve abnormality with dysrhythmia.
Pre-op for Bentall procedure.

EXAM:
CHEST  2 VIEW

[w chest pa]
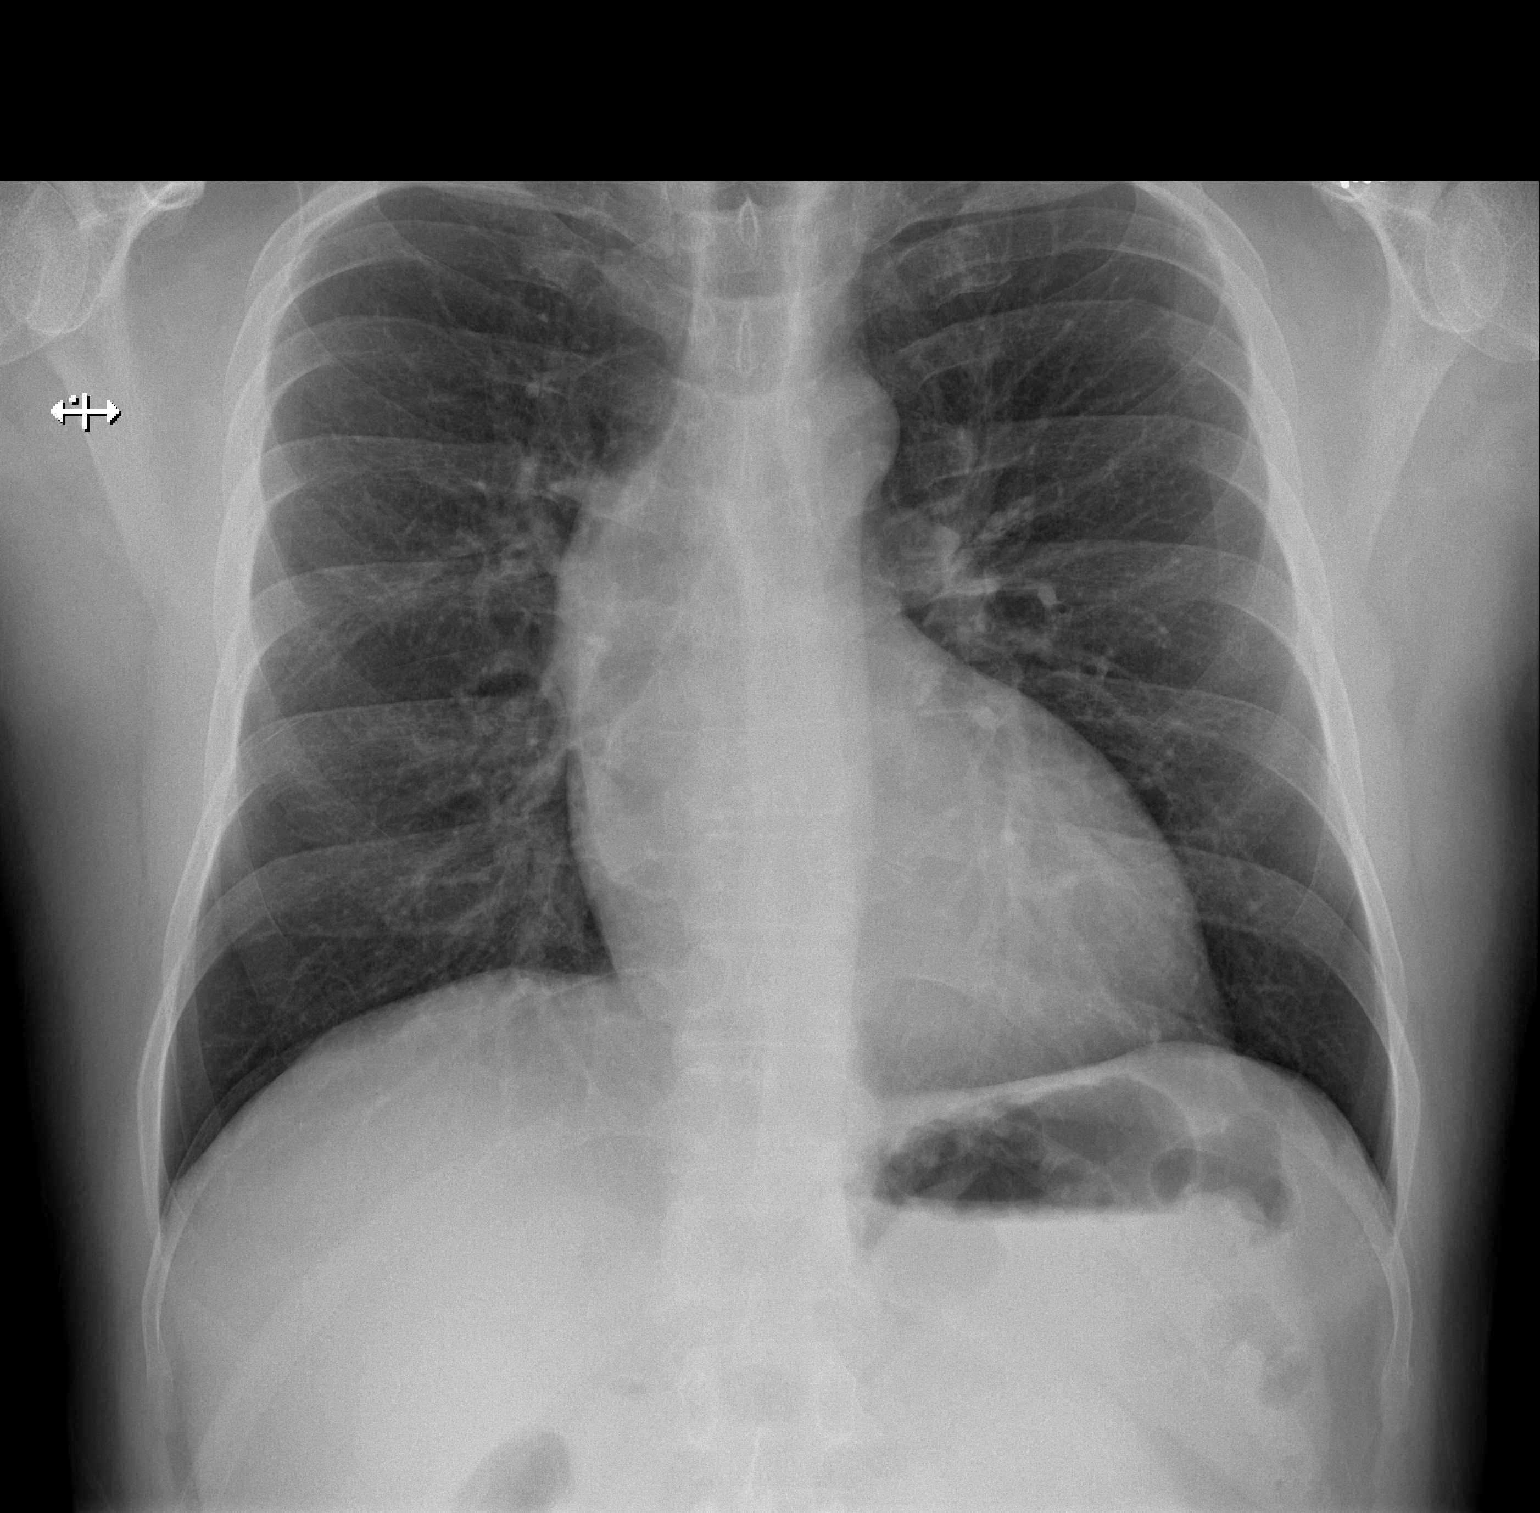

[w chest lat]
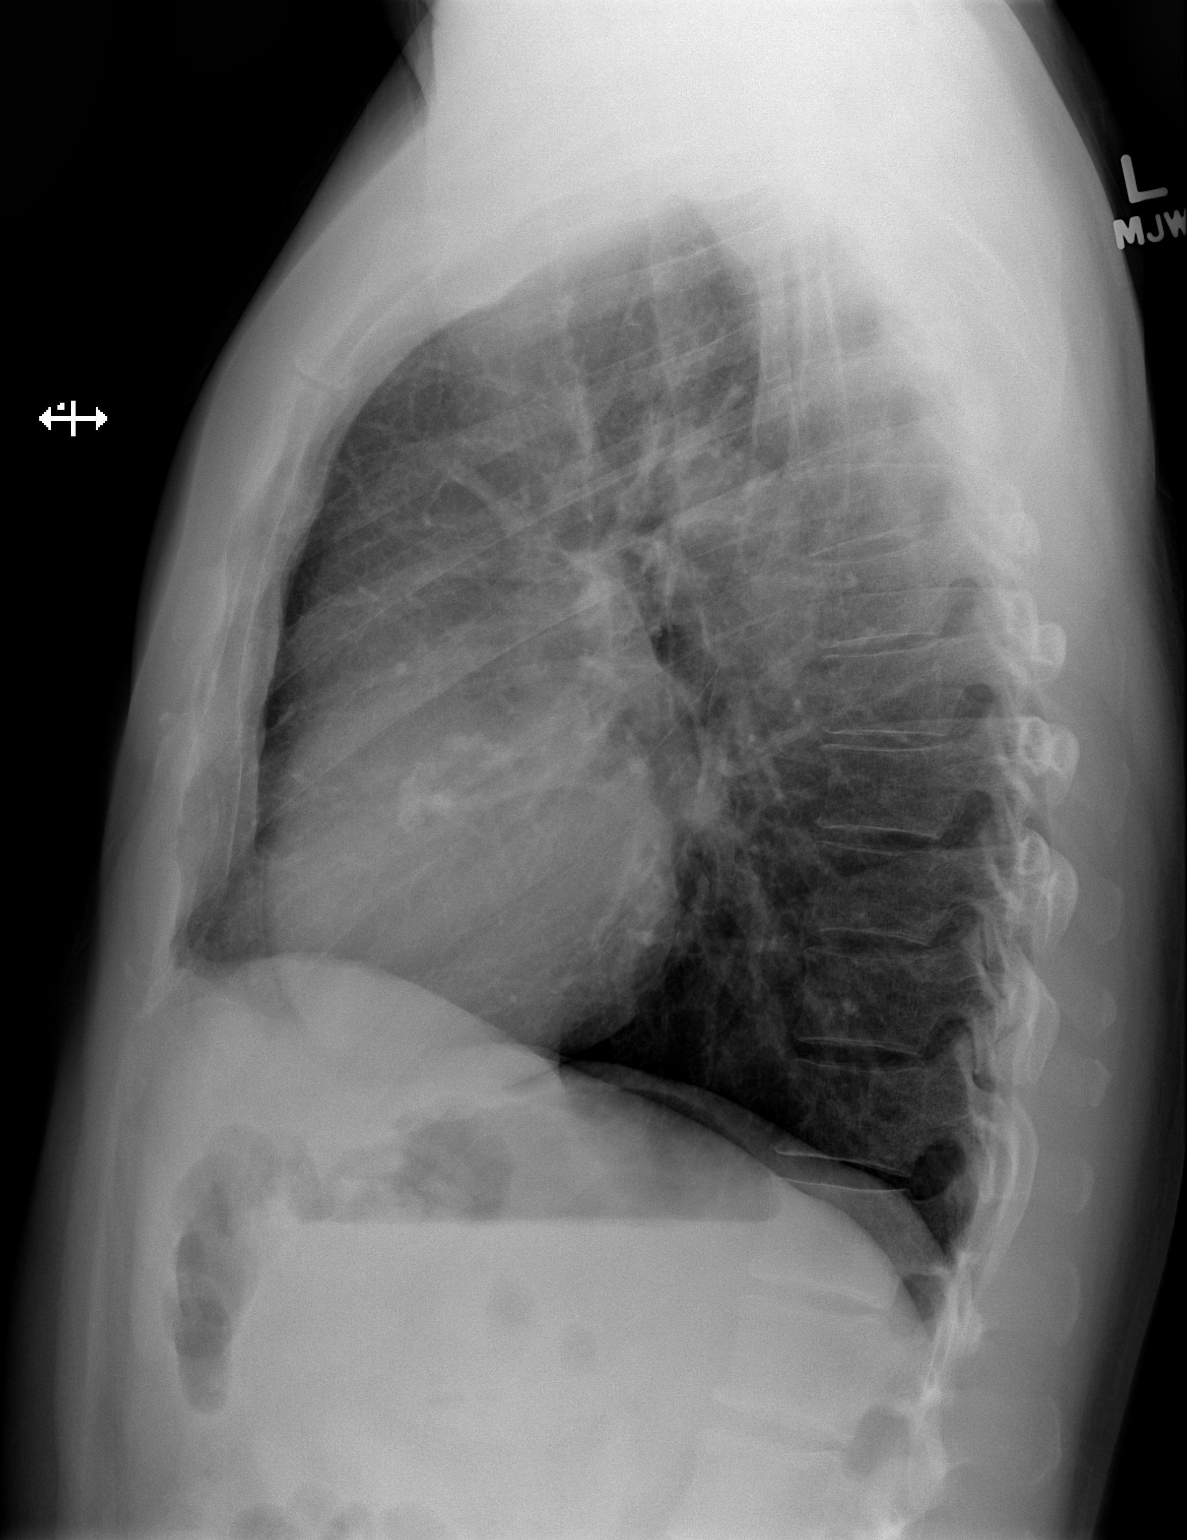

[2 of 2 positions shown; findings below may reference images not displayed]

FINDINGS: The heart size and mediastinal contours are stable with stable
dilatation of the ascending aorta. Dense calcifications of the
aortic valve again noted. The lungs are clear. There is no pleural
effusion or pneumothorax. No osseous abnormalities are seen.
IMPRESSION: No active cardiopulmonary process. Calcified aortic valve and
poststenotic dilatation of the ascending aorta consistent with
aortic stenosis.

## 2016-03-05 ENCOUNTER — Other Ambulatory Visit: Payer: Self-pay | Admitting: Internal Medicine

## 2016-03-05 LAB — PROTIME-INR
INR: 2.8 — AB (ref 0.8–1.2)
Prothrombin Time: 28.8 s — ABNORMAL HIGH (ref 9.1–12.0)

## 2016-03-09 IMAGING — CR DG CHEST 1V PORT
1 series · 1 of 1 positions shown · non-contrast
Comparison: November 27, 2014

CLINICAL DATA: Status post aortic valve replacement for congenital
aortic stenosis.

EXAM:
PORTABLE CHEST - 1 VIEW

[AP]
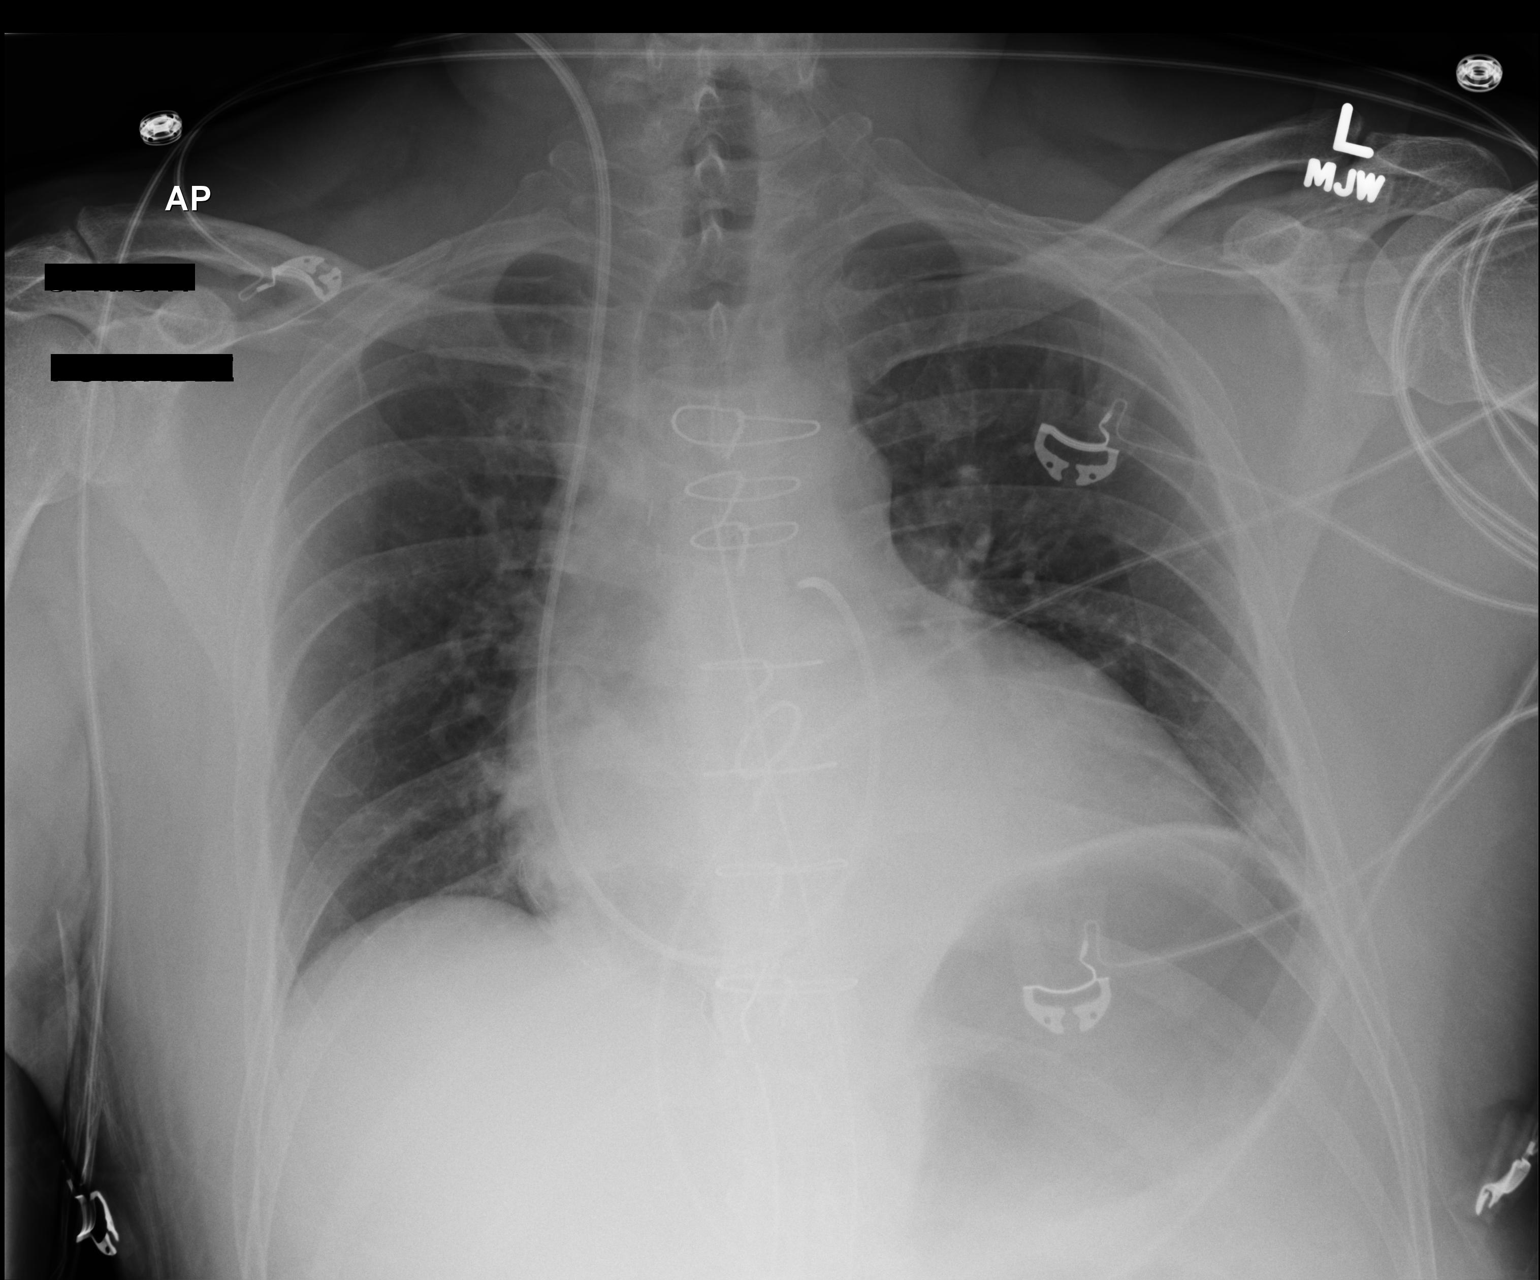

[1 of 1 positions shown; findings below may reference images not displayed]

FINDINGS: Endotracheal tube and nasogastric tube have been removed. Swan-Ganz
catheter tip is in the proximal right main pulmonary artery. There
are 2 mediastinal drains. No pneumothorax. There is an aortic valve
replacement. Heart is mildly enlarged, stable. The pulmonary
vascularity is normal. There is mild medial basilar atelectasis
bilaterally. Lungs elsewhere clear.
IMPRESSION: Tube and catheter positions as described without pneumothorax. Mild
medial basilar atelectatic change. Lungs otherwise clear. Mild
cardiomegaly is stable.

## 2016-03-10 IMAGING — CR DG CHEST 1V PORT
1 series · 1 of 1 positions shown · non-contrast
Comparison: History

CLINICAL DATA: Aortic stenosis

EXAM:
PORTABLE CHEST - 1 VIEW

[AP]
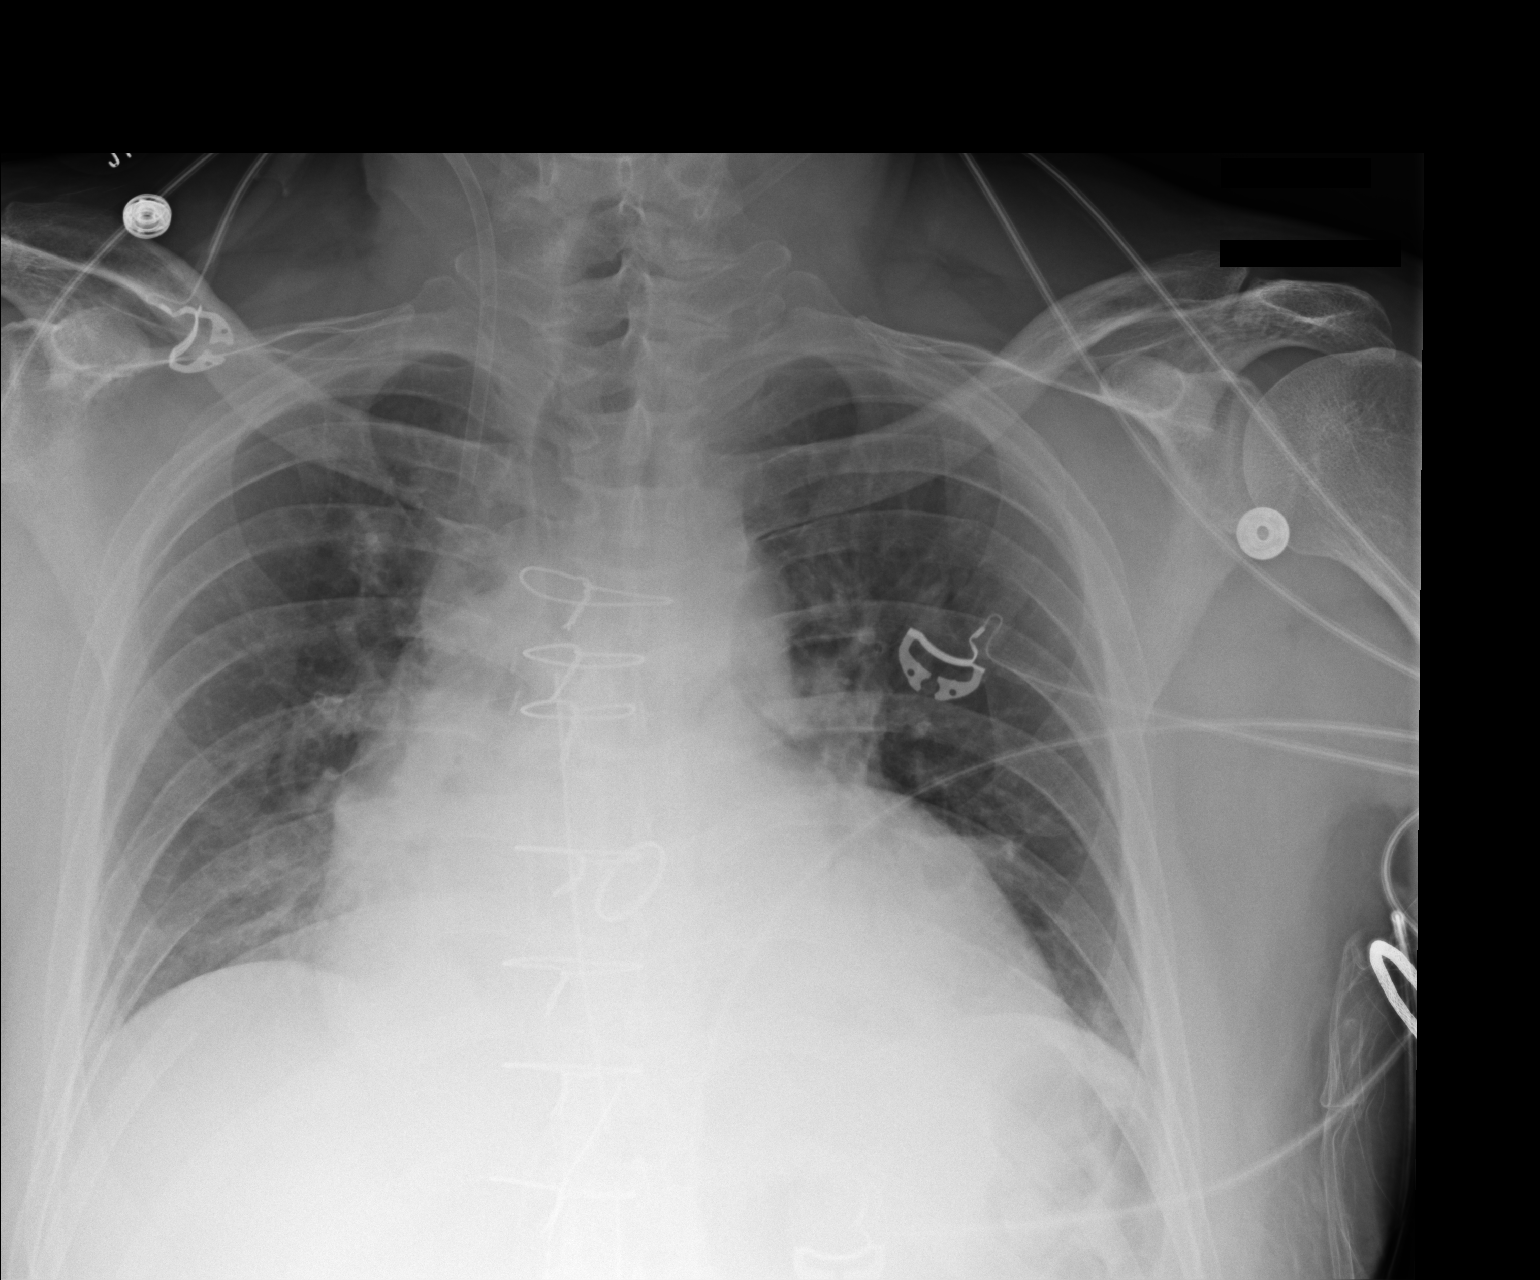

[1 of 1 positions shown; findings below may reference images not displayed]

FINDINGS: Heart remains moderately enlarged. Sy removed with the
introducer left in place. Stable mediastinal chest tube. Low
volumes. Normal vascularity. Bibasilar atelectasis increased. No
pneumothorax.
IMPRESSION: Increased bibasilar atelectasis.

No sign of pulmonary edema or vascular congestion.  No pneumothorax.

## 2016-03-28 ENCOUNTER — Ambulatory Visit (INDEPENDENT_AMBULATORY_CARE_PROVIDER_SITE_OTHER): Payer: 59 | Admitting: Pharmacist

## 2016-03-28 DIAGNOSIS — Z954 Presence of other heart-valve replacement: Secondary | ICD-10-CM

## 2016-04-07 ENCOUNTER — Other Ambulatory Visit: Payer: Self-pay | Admitting: Internal Medicine

## 2016-04-08 LAB — PROTIME-INR
INR: 1.9 — AB (ref 0.8–1.2)
Prothrombin Time: 19.8 s — ABNORMAL HIGH (ref 9.1–12.0)

## 2016-04-14 IMAGING — CR DG CHEST 2V
2 series · 2 of 2 positions shown · non-contrast
Comparison: 11/30/2014.

CLINICAL DATA: Heart surgery.

EXAM:
CHEST  2 VIEW

[view not recorded (1 of 2)]
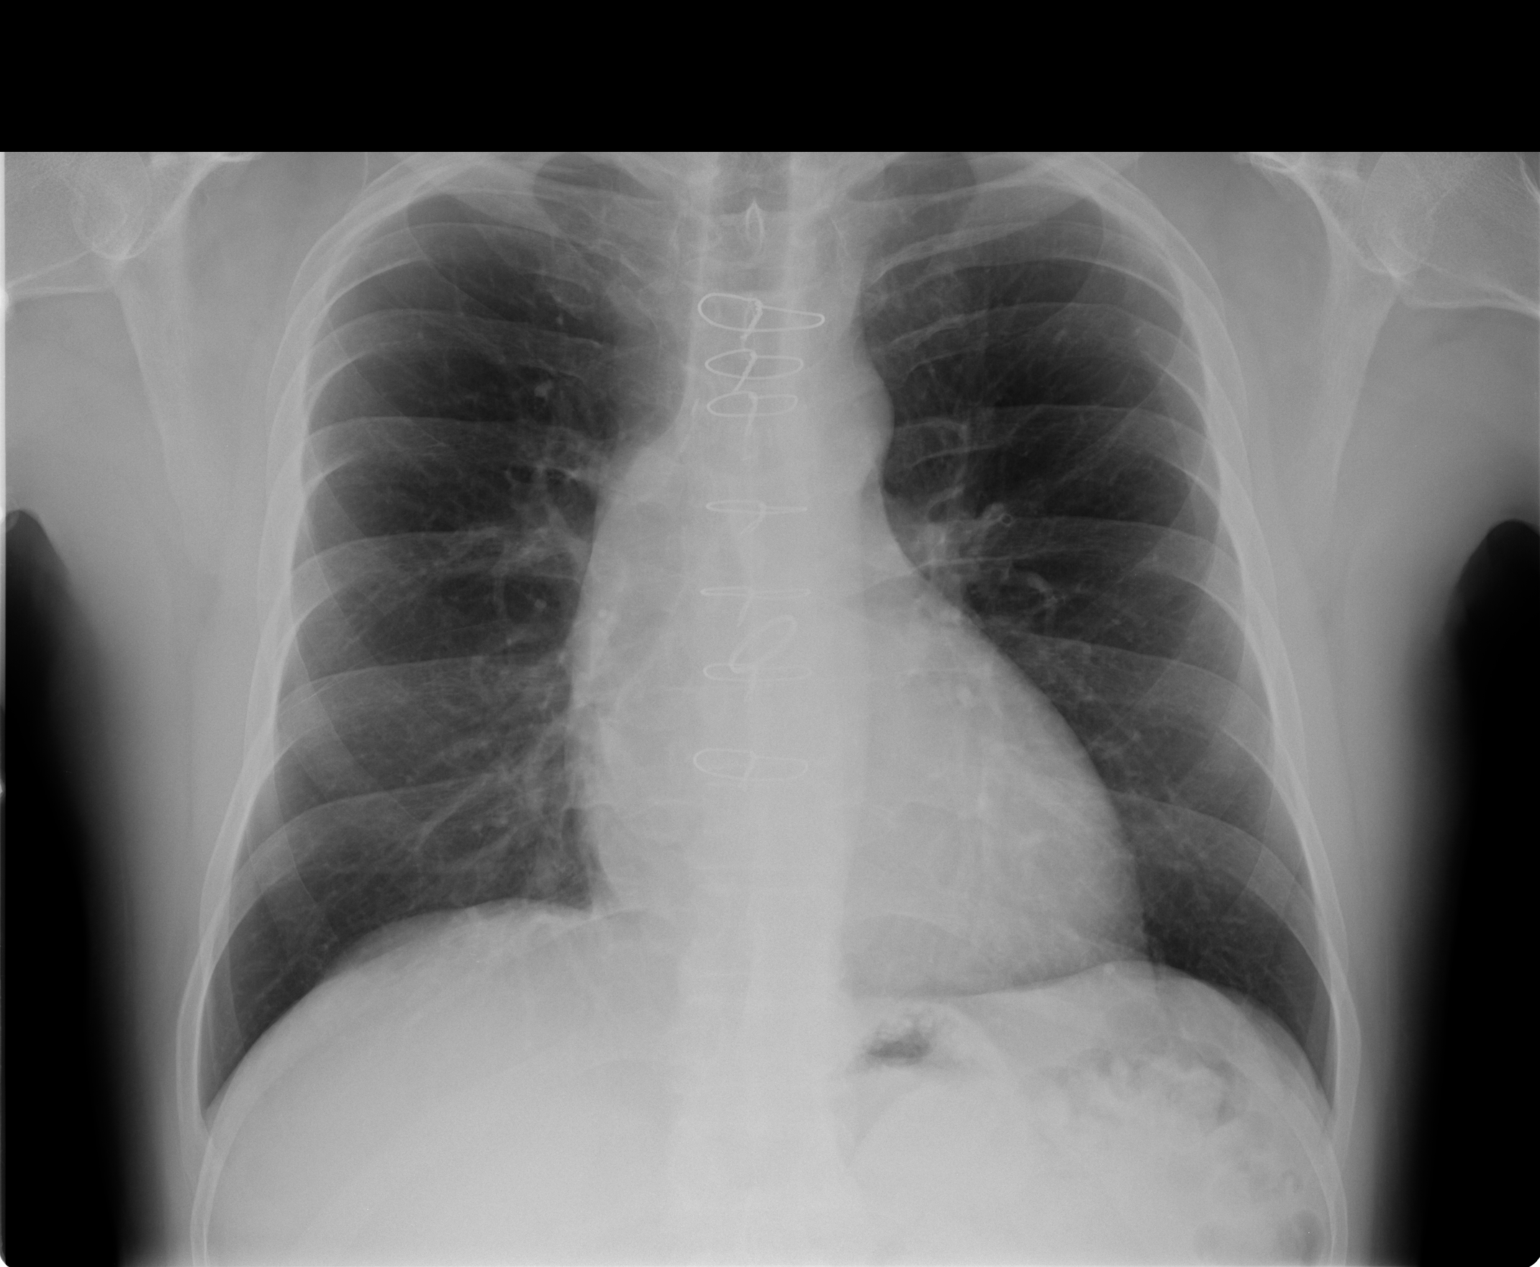

[view not recorded (2 of 2)]
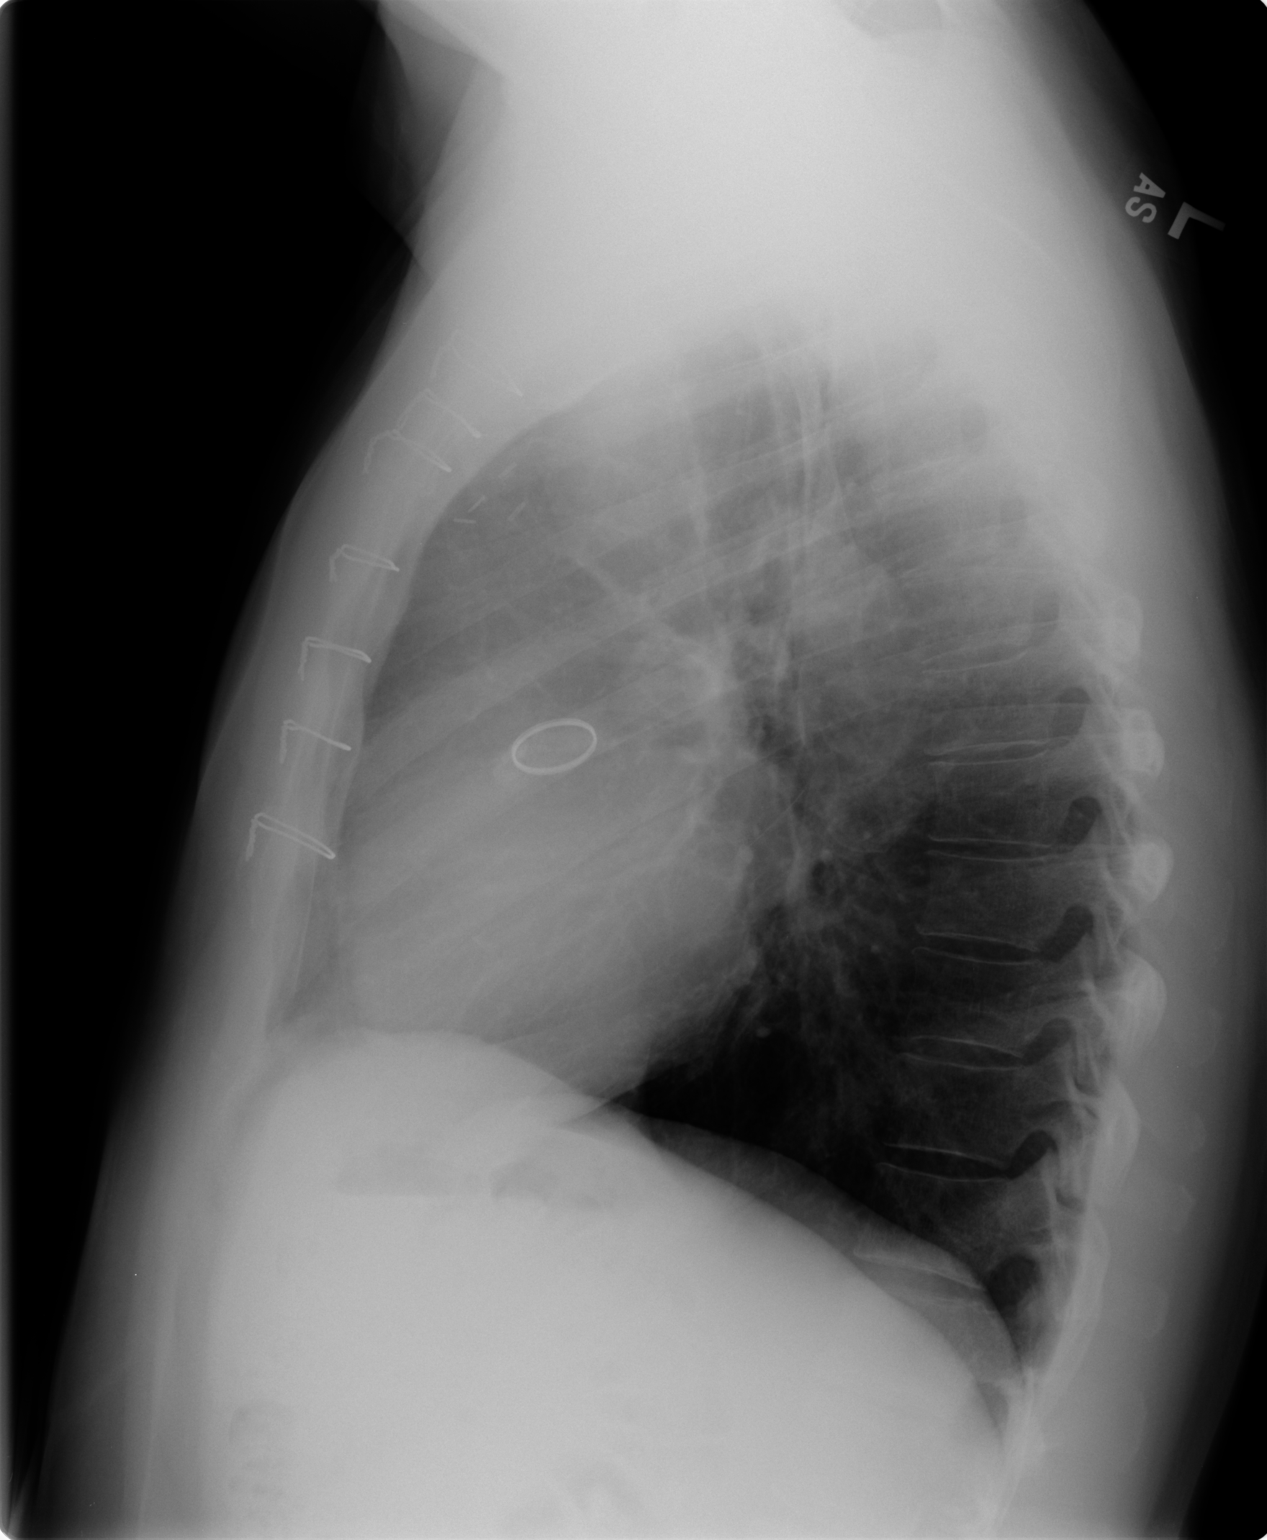

[2 of 2 positions shown; findings below may reference images not displayed]

FINDINGS: Prior aortic valve replacement. Cardiomegaly. No pulmonary venous
congestion. No focal pulmonary infiltrate. No pleural effusion or
pneumothorax. No acute bony abnormality .
IMPRESSION: Aortic valve replacement. Mild cardiomegaly. No CHF. No acute
pulmonary disease.

## 2016-04-22 ENCOUNTER — Ambulatory Visit (INDEPENDENT_AMBULATORY_CARE_PROVIDER_SITE_OTHER): Payer: 59 | Admitting: Pharmacist Clinician (PhC)/ Clinical Pharmacy Specialist

## 2016-04-22 DIAGNOSIS — Z954 Presence of other heart-valve replacement: Secondary | ICD-10-CM | POA: Diagnosis not present

## 2016-05-07 ENCOUNTER — Encounter: Payer: Self-pay | Admitting: Internal Medicine

## 2016-05-07 ENCOUNTER — Ambulatory Visit (INDEPENDENT_AMBULATORY_CARE_PROVIDER_SITE_OTHER): Payer: 59 | Admitting: Internal Medicine

## 2016-05-07 VITALS — BP 136/82 | HR 73 | Temp 97.9°F | Ht 69.0 in | Wt 183.0 lb

## 2016-05-07 DIAGNOSIS — Z23 Encounter for immunization: Secondary | ICD-10-CM

## 2016-05-07 DIAGNOSIS — Z125 Encounter for screening for malignant neoplasm of prostate: Secondary | ICD-10-CM | POA: Diagnosis not present

## 2016-05-07 DIAGNOSIS — Z7901 Long term (current) use of anticoagulants: Secondary | ICD-10-CM | POA: Diagnosis not present

## 2016-05-07 DIAGNOSIS — R5383 Other fatigue: Secondary | ICD-10-CM | POA: Diagnosis not present

## 2016-05-07 DIAGNOSIS — I10 Essential (primary) hypertension: Secondary | ICD-10-CM | POA: Diagnosis not present

## 2016-05-07 DIAGNOSIS — Z0000 Encounter for general adult medical examination without abnormal findings: Secondary | ICD-10-CM

## 2016-05-07 DIAGNOSIS — I2510 Atherosclerotic heart disease of native coronary artery without angina pectoris: Secondary | ICD-10-CM

## 2016-05-07 DIAGNOSIS — R7989 Other specified abnormal findings of blood chemistry: Secondary | ICD-10-CM

## 2016-05-07 DIAGNOSIS — Z954 Presence of other heart-valve replacement: Secondary | ICD-10-CM

## 2016-05-07 DIAGNOSIS — I471 Supraventricular tachycardia, unspecified: Secondary | ICD-10-CM

## 2016-05-07 DIAGNOSIS — E785 Hyperlipidemia, unspecified: Secondary | ICD-10-CM

## 2016-05-07 NOTE — Progress Notes (Addendum)
Patient ID: Frank [name], male    DOB: October 29, 1965  Age: 50 y.o. MRN: [id number]  The patient is here for annual  wellness examination and management of other chronic and acute problems.   The risk factors are reflected in the social history.  The roster of all physicians providing medical care to patient - is listed in the Snapshot section of the chart.   Home safety : The patient has smoke detectors in the home. They wear seatbelts.  There are no firearms at home. There is no violence in the home.   There is no risks for hepatitis, STDs or HIV. There is no   history of blood transfusion. They have no travel history to infectious disease endemic areas of the world.  The patient has seen their dentist in the last six month. They have seen their eye doctor in the last year.   . Discussed the need for sun protection: hats, long sleeves and use of sunscreen if there is significant sun exposure.   Diet: the importance of a healthy diet is discussed. They do have a healthy diet.  The benefits of regular aerobic exercise were discussed. She runs and orks out 3 to 4 days per week. 60 minutes.   Depression screen: there are no signs or vegative symptoms of depression- irritability, change in appetite, anhedonia, sadness/tearfullness.   The following portions of the patient's history were reviewed and updated as appropriate: allergies, current medications, past family history, past medical history,  past surgical history, past social history  and problem list.  Visual acuity was not assessed per patient preference since she has regular follow up with her ophthalmologist. Hearing and body mass index were assessed and reviewed.   During the course of the visit the patient was educated and counseled about appropriate screening and preventive services including : fall prevention , diabetes screening, nutrition counseling, colorectal cancer screening, and recommended immunizations.    CC: The  primary encounter diagnosis was Encounter for preventive health examination. Diagnoses of Encounter for immunization, Prostate cancer screening, Long term current use of anticoagulant therapy, Other fatigue, Hyperlipidemia, CAD in native artery, Dyslipidemia, S/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, SVT (supraventricular tachycardia) ([facility]), Essential hypertension, and Abnormal TSH were also pertinent to this visit.  S/p aortic valve replacement  in May 2016.  All going well .   Coumadin clinic through [facility],   dose has been increased for INR drifting down  History of internal hemorrhoids ,  Has brief episodes of bleeding , self limited.   Occurs with therapeutic INR only when exercising  Last colonoscopy 2011 for same nor polyps.    Insomnia:  Chronic, using 1/2 xanax for early morning waking that occurs typcally at . 2 am..  Not every night,  Just weekdays .  Tolerating Crestor   History Frank [name] has a past medical history of Congenital heart valve abnormality; Dysrhythmia; GERD (gastroesophageal reflux disease); Hypercholesterolemia; and PONV (postoperative nausea and vomiting).   He has a past surgical history that includes Inguinal hernia repair (2006); Tonsillectomy (2001); Anterior cruciate ligament repair (Right, 08/01/2014); TEE without cardioversion (N/A, 08/01/2014); coronary angiogram (10/26/2014); Cardiac catheterization; Bentall procedure (N/A, 11/27/2014); and TEE without cardioversion (N/A, 11/27/2014).   His family history includes Arthritis in his mother; Heart disease in his father.He reports that he has never smoked. He has never used smokeless tobacco. He reports that he drinks about 4.2 oz of alcohol per week . He reports that he does not use drugs.  Outpatient Medications Prior to Visit  Medication Sig Dispense Refill  . ALPRAZolam (XANAX) 0.5 MG tablet Take 1 tablet (0.5 mg total) by mouth at bedtime as needed. 30 tablet 5  . Ascorbic Acid (VITAMIN C) 1000 MG  tablet Take 1,000 mg by mouth daily.    Marland Kitchen losartan (COZAAR) 50 MG tablet Take 1 tablet (50 mg total) by mouth daily. 90 tablet 3  . metoprolol tartrate (LOPRESSOR) 25 MG tablet Take 1 tablet by mouth two  times daily 180 tablet 2  . rosuvastatin (CRESTOR) 20 MG tablet Take 2 tablets by mouth  daily 180 tablet 2  . warfarin (COUMADIN) 5 MG tablet TAKE 1 TABLET BY MOUTH  DAILY. OR AS DIRECTED BY  COUMADIN CLINIC 90 tablet 1   No facility-administered medications prior to visit.     Review of Systems   Patient denies headache, fevers, malaise, unintentional weight loss, skin rash, eye pain, sinus congestion and sinus pain, sore throat, dysphagia,  hemoptysis , cough, dyspnea, wheezing, chest pain, palpitations, orthopnea, edema, abdominal pain, nausea, melena, diarrhea, constipation, flank pain, dysuria, hematuria, urinary  Frequency, nocturia, numbness, tingling, seizures,  Focal weakness, Loss of consciousness,  Tremor, insomnia, depression, anxiety, and suicidal ideation.      Objective:  BP 136/82   Pulse 73   Temp 97.9 F (36.6 C) (Oral)   Ht 5\' 9"  (1.753 m)   Wt 183 lb (83 kg)   SpO2 99%   BMI 27.02 kg/m   Physical Exam   General appearance: alert, cooperative and appears stated age Ears: normal TM's and external ear canals both ears Throat: lips, mucosa, and tongue normal; teeth and gums normal Neck: no adenopathy, no carotid bruit, supple, symmetrical, trachea midline and thyroid not enlarged, symmetric, no tenderness/mass/nodules Back: symmetric, no curvature. ROM normal. No CVA tenderness. Lungs: clear to auscultation bilaterally Heart: regular rate and rhythm, S1, S2 normal, no murmur, click, rub or gallop Abdomen: soft, non-tender; bowel sounds normal; no masses,  no organomegaly Pulses: 2+ and symmetric Skin: thoracic midline vertical scra with hyeremic hyperkeratotic lower third resembling keloid formation.  otherwise skin color, texture, turgor normal. No rashes or  lesions Lymph nodes: Cervical, supraclavicular, and axillary nodes normal.    Assessment & Plan:   Problem List Items Addressed This Visit    Encounter for preventive health examination - Primary    Annual comprehensive preventive exam was done as well as an evaluation and management of acute and chronic conditions .  During the course of the visit the patient was educated and counseled about appropriate screening and preventive services including :  diabetes screening, lipid analysis with projected  10 year  risk for CAD , nutrition counseling, prostate and colorectal cancer screening, and recommended immunizations.  Printed recommendations for health maintenance screenings was given.       SVT (supraventricular tachycardia) (H[facility])    continue metoprolol       S/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    He has [id number] fully from surgery done in 2016 and maintains an active lifestyle with no exercise intolerance.       Essential hypertension    Well controlled on current regimen of losartan (and metoprolol for histor of SVT) . Renal function stable, no changes today.  . Lab Results  Component Value Date   CREATININE 1.24 05/08/2016   Lab Results  Component Value Date   NA 139 05/08/2016   K 4.8 05/08/2016   CL 100 05/08/2016   CO2 24  05/08/2016         CAD in native artery    Non critical.  Continue crestor. On coumadin for AVR.   Lab Results  Component Value Date   CHOL 143 05/08/2016   HDL 43 05/08/2016   LDLCALC 74 05/08/2016   TRIG 130 05/08/2016   CHOLHDL 3.3 05/08/2016         Dyslipidemia    Managed with Crestor for CAD noted during workup for AVR      Abnormal TSH    He has had weight gain but no other symptoms  Repeat in 6 months        Other Visit Diagnoses    Encounter for immunization       Relevant Orders   Flu Vaccine QUAD 36+ mos IM (Completed)   Prostate cancer screening       Relevant Orders   PSA (Completed)   Long term  current use of anticoagulant therapy       Relevant Orders   CBC with Differential/Platelet (Completed)   Other fatigue       Relevant Orders   Comprehensive metabolic panel (Completed)   TSH (Completed)   Hyperlipidemia       Relevant Orders   Lipid panel (Completed)      I am having Mr. Burkins maintain his vitamin C, warfarin, losartan, rosuvastatin, metoprolol tartrate, and ALPRAZolam.  No orders of the defined types were placed in this encounter.   There are no discontinued medications.  Follow-up: Return in about 1 year (around 05/07/2017).   [name], MD

## 2016-05-07 NOTE — Patient Instructions (Signed)

## 2016-05-09 LAB — LIPID PANEL
Chol/HDL Ratio: 3.3 ratio units (ref 0.0–5.0)
Cholesterol, Total: 143 mg/dL (ref 100–199)
HDL: 43 mg/dL (ref >39)
LDL Calculated: 74 mg/dL (ref 0–99)
Triglycerides: 130 mg/dL (ref 0–149)
VLDL Cholesterol Cal: 26 mg/dL (ref 5–40)

## 2016-05-09 LAB — CBC WITH DIFFERENTIAL/PLATELET
BASOS ABS: 0 10*3/uL (ref 0.0–0.2)
Basos: 0 %
EOS (ABSOLUTE): 0.2 10*3/uL (ref 0.0–0.4)
Eos: 3 %
Hematocrit: 45 % (ref 37.5–51.0)
Hemoglobin: 15.6 g/dL (ref 12.6–17.7)
IMMATURE GRANULOCYTES: 0 %
Immature Grans (Abs): 0 10*3/uL (ref 0.0–0.1)
LYMPHS ABS: 2.1 10*3/uL (ref 0.7–3.1)
Lymphs: 30 %
MCH: 31 pg (ref 26.6–33.0)
MCHC: 34.7 g/dL (ref 31.5–35.7)
MCV: 89 fL (ref 79–97)
MONOS ABS: 1 10*3/uL — AB (ref 0.1–0.9)
Monocytes: 14 %
NEUTROS PCT: 53 %
Neutrophils Absolute: 3.8 10*3/uL (ref 1.4–7.0)
PLATELETS: 227 10*3/uL (ref 150–379)
RBC: 5.04 x10E6/uL (ref 4.14–5.80)
RDW: 14.3 % (ref 12.3–15.4)
WBC: 7.2 10*3/uL (ref 3.4–10.8)

## 2016-05-09 LAB — COMPREHENSIVE METABOLIC PANEL
A/G RATIO: 1.7 (ref 1.2–2.2)
ALBUMIN: 4.7 g/dL (ref 3.5–5.5)
ALK PHOS: 58 IU/L (ref 39–117)
ALT: 29 IU/L (ref 0–44)
AST: 31 IU/L (ref 0–40)
BILIRUBIN TOTAL: 0.5 mg/dL (ref 0.0–1.2)
BUN / CREAT RATIO: 10 (ref 9–20)
BUN: 13 mg/dL (ref 6–24)
CHLORIDE: 100 mmol/L (ref 96–106)
CO2: 24 mmol/L (ref 18–29)
Calcium: 9.6 mg/dL (ref 8.7–10.2)
Creatinine, Ser: 1.24 mg/dL (ref 0.76–1.27)
GFR calc non Af Amer: 67 mL/min/{1.73_m2} (ref >59)
GFR, EST AFRICAN AMERICAN: 77 mL/min/{1.73_m2} (ref >59)
GLOBULIN, TOTAL: 2.8 g/dL (ref 1.5–4.5)
GLUCOSE: 92 mg/dL (ref 65–99)
POTASSIUM: 4.8 mmol/L (ref 3.5–5.2)
SODIUM: 139 mmol/L (ref 134–144)
TOTAL PROTEIN: 7.5 g/dL (ref 6.0–8.5)

## 2016-05-09 LAB — PSA: Prostate Specific Ag, Serum: 0.4 ng/mL (ref 0.0–4.0)

## 2016-05-09 LAB — TSH: TSH: 5.25 u[IU]/mL — AB (ref 0.450–4.500)

## 2016-05-10 ENCOUNTER — Encounter: Payer: Self-pay | Admitting: Internal Medicine

## 2016-05-10 DIAGNOSIS — R7989 Other specified abnormal findings of blood chemistry: Secondary | ICD-10-CM | POA: Insufficient documentation

## 2016-05-10 NOTE — Assessment & Plan Note (Signed)
Managed with Crestor for CAD noted during workup for AVR

## 2016-05-10 NOTE — Assessment & Plan Note (Signed)
Non critical.  Continue crestor. On coumadin for AVR.   Lab Results  Component Value Date   CHOL 143 05/08/2016   HDL 43 05/08/2016   LDLCALC 74 05/08/2016   TRIG 130 05/08/2016   CHOLHDL 3.3 05/08/2016

## 2016-05-10 NOTE — Assessment & Plan Note (Signed)

## 2016-05-10 NOTE — Assessment & Plan Note (Signed)
He has recovere3d fully from surgery done in 2016 and maintains an active lifestyle with no exercise intolerance.

## 2016-05-10 NOTE — Assessment & Plan Note (Signed)
continue metoprolol ?

## 2016-05-10 NOTE — Assessment & Plan Note (Signed)
He has had weight gain but no other symptoms  Repeat in 6 months

## 2016-05-10 NOTE — Assessment & Plan Note (Signed)
Well controlled on current regimen of losartan (and metoprolol for histor of SVT) . Renal function stable, no changes today.  . Lab Results  Component Value Date   CREATININE 1.24 05/08/2016   Lab Results  Component Value Date   NA 139 05/08/2016   K 4.8 05/08/2016   CL 100 05/08/2016   CO2 24 05/08/2016

## 2016-05-27 ENCOUNTER — Other Ambulatory Visit: Payer: Self-pay | Admitting: Internal Medicine

## 2016-05-28 NOTE — Telephone Encounter (Signed)
Patient overdue for

## 2016-05-29 ENCOUNTER — Telehealth: Payer: Self-pay | Admitting: Pharmacist Clinician (PhC)/ Clinical Pharmacy Specialist

## 2016-05-29 NOTE — Telephone Encounter (Signed)
Patient works for [employer] for him to be sure the INR results are being sent to Dr. Debara Pickett.

## 2016-06-05 ENCOUNTER — Ambulatory Visit (INDEPENDENT_AMBULATORY_CARE_PROVIDER_SITE_OTHER): Payer: 59 | Admitting: Pharmacist

## 2016-06-05 DIAGNOSIS — Z952 Presence of prosthetic heart valve: Secondary | ICD-10-CM | POA: Diagnosis not present

## 2016-06-05 DIAGNOSIS — Z954 Presence of other heart-valve replacement: Secondary | ICD-10-CM | POA: Diagnosis not present

## 2016-06-05 LAB — POCT INR: INR: 3.6

## 2016-06-18 ENCOUNTER — Other Ambulatory Visit: Payer: Self-pay | Admitting: Internal Medicine

## 2016-06-19 LAB — PROTIME-INR
INR: 2.3 — AB (ref 0.8–1.2)
PROTHROMBIN TIME: 23.4 s — AB (ref 9.1–12.0)

## 2016-06-20 ENCOUNTER — Encounter: Payer: Self-pay | Admitting: Internal Medicine

## 2016-06-20 ENCOUNTER — Ambulatory Visit (INDEPENDENT_AMBULATORY_CARE_PROVIDER_SITE_OTHER): Payer: 59 | Admitting: Pharmacist

## 2016-06-20 DIAGNOSIS — Z954 Presence of other heart-valve replacement: Secondary | ICD-10-CM

## 2016-07-21 ENCOUNTER — Other Ambulatory Visit: Payer: Self-pay | Admitting: Internal Medicine

## 2016-07-22 LAB — PROTIME-INR
INR: 2 — AB (ref 0.8–1.2)
PROTHROMBIN TIME: 20.4 s — AB (ref 9.1–12.0)

## 2016-07-23 ENCOUNTER — Ambulatory Visit (INDEPENDENT_AMBULATORY_CARE_PROVIDER_SITE_OTHER): Payer: 59 | Admitting: Pharmacist Clinician (PhC)/ Clinical Pharmacy Specialist

## 2016-07-23 DIAGNOSIS — Z954 Presence of other heart-valve replacement: Secondary | ICD-10-CM

## 2016-08-22 ENCOUNTER — Ambulatory Visit (INDEPENDENT_AMBULATORY_CARE_PROVIDER_SITE_OTHER): Payer: 59 | Admitting: Pharmacist Clinician (PhC)/ Clinical Pharmacy Specialist

## 2016-08-22 ENCOUNTER — Encounter: Payer: Self-pay | Admitting: Internal Medicine

## 2016-08-22 ENCOUNTER — Ambulatory Visit (INDEPENDENT_AMBULATORY_CARE_PROVIDER_SITE_OTHER): Payer: 59 | Admitting: Internal Medicine

## 2016-08-22 VITALS — BP 130/88 | HR 55 | Ht 70.0 in | Wt 186.4 lb

## 2016-08-22 DIAGNOSIS — Z5181 Encounter for therapeutic drug level monitoring: Secondary | ICD-10-CM

## 2016-08-22 DIAGNOSIS — I2510 Atherosclerotic heart disease of native coronary artery without angina pectoris: Secondary | ICD-10-CM | POA: Diagnosis not present

## 2016-08-22 DIAGNOSIS — E785 Hyperlipidemia, unspecified: Secondary | ICD-10-CM

## 2016-08-22 DIAGNOSIS — Z95828 Presence of other vascular implants and grafts: Secondary | ICD-10-CM

## 2016-08-22 DIAGNOSIS — Z954 Presence of other heart-valve replacement: Secondary | ICD-10-CM

## 2016-08-22 LAB — POCT INR: INR: 2.7

## 2016-08-22 NOTE — Patient Instructions (Addendum)
Your physician wants you to follow-up in: ONE YEAR with Dr. Debara Pickett. You will receive a reminder letter in the mail two months in advance. If you don't receive a letter, please call our office to schedule the follow-up appointment.  Your physician has requested that you have an echocardiogram in June 2018. Echocardiography is a painless test that uses sound waves to create images of your heart. It provides your doctor with information about the size and shape of your heart and how well your heart's chambers and valves are working. This procedure takes approximately one hour. There are no restrictions for this procedure.

## 2016-08-22 NOTE — Progress Notes (Signed)
OFFICE NOTE  Chief Complaint:  Routine [name]  Primary Care Physician: Frank Mc, MD  HPI:  Frank [name] is a 51 y.o. male with a history of at least moderate AS (followed by Frank [name] at the [facility]), presented for ACL repair with Frank [name]. Preoperatively, he was evaluated by Frank [name] (cardiac anesthesia) and found to have a loud AS murmur- After some discussion, interoperative TEE was recommended and performed. I personally reviewed the TEE images which do demonstrate severe aortic stenosis, moderate AI and mild to moderate MR (obtained during surgery, on pressor, PPV, etc, but still significant). He was recently told he probably had another 5 years before needing to consider surgery. Mean gradient is already above 40 mmHg. Clinically, he has been asymptomatic (no chest pain, dyspnea, syncope, etc,) but recently he has had more palpitations (PVC's) and was started on b-blocker. He did manage to get through surgery uneventfully.   He returns [name] in [name] to the office. He continues to deny any chest pain, worsening shortness of breath, presyncope or syncopal episodes. He has not pushed himself significantly due to his recent knee surgery. He is undergoing rehabilitation and getting stronger. A repeat echocardiogram was just performed as an outpatient. This was initially read by one of my partners and indicated mild to moderate aortic stenosis based on a mean valve gradient in the 20s. However subsequent images during the study do indicate mean gradients greater than 50 from the right upper sternal border using Pedoff probe. I therefore personally reviewed the echocardiogram images and made the appropriate corrections, and the results are as follows:  Study Conclusions  - Left ventricle: The cavity size was normal. Wall thickness was increased in a pattern of mild LVH. Systolic function was normal. The estimated ejection fraction was in the range of 55%  to 60%. - Aortic valve: The aortic valve is bicuspid and is heavily calcified with restricted leaflet motion. Peak and mean gradients through the valve are 100 and 59 mm Hg respectively, consistent with severe AS. The calculated AVA is 0.6 cm2. There is moderate, eccentric AI. There was mild regurgitation. - Aorta: Aortic root dimension: 39 mm (ED). - Aortic root: The aortic root is mildly dilated. - Mitral valve: Calcified annulus. Mildly thickened leaflets . There was mild regurgitation. - Left atrium: LA Volume/BSA= 46.4 ml/m2. The atrium is moderately dilated.  Impressions:  - LVEF 60-65%, moderate concentric LVH, bicuspid and severely stenotic aortic valve- AVA 0.6 cm2, peak and mean gradients of 100 mmHg and 59 mmHg, respectively - LVOT diameter measured at 2.4 cm. There is moderate AI. There is mild to moderate MR with restricted anterior leaflet motion secondary to eccentric AI.   Pedoff measurements do indeed show a very high mean gradient of 59 and peak gradient of 100 mmHg. Based on a generous LVOT diameter of 2.4 cm, the calculated aortic valve gradient was 0.6 cm consistent with severe aortic stenosis.  I saw Frank [name] back in the office [name]. He was recently on a trip and noticed increasing shortness of breath and easy fatigue with exercise which was a new finding for him. He feels that this is symptomatic aortic stenosis and I would agree. I discussed the case further with Frank [name], the cardiac thoracic surgeon that he's been seeing, and we feel that it's probably the time now to entertain valve replacement. Based on that there is also concern about aortopathy given his bicuspid valve. He underwent a CT scan of  the aorta and coronaries which showed does reveal multivessel coronary artery calcification as well as calcium deposits in the left main coronary. The aortic valve is heavily calcified and restricted in motion. There was at least mild cardiac  hypertrophy and dilatation of the left ventricle. The ascending aorta is dilated measuring 4.3-4.4 cm. There is no evidence for coarctation or enlargement of the thoracic or descending aorta.  Frank [name]. He successfully underwent Bentall procedure with hemi-arch replacement using a 28 mm Hemashield graft under deep hypothermic circulatory arrest and Bentall Procedure ( replacement of aortic valve and aortic root with reimplantation of the coronary arteries) using a 21 mm St. Jude mechanical valve graft. Overall he is doing exceedingly well. He continues to do exercise. He is also recovering from recent knee surgery. He denies any chest pain or worsening shortness of breath. His energy levels improved significantly. His median sternotomy is healing well. He did have some postoperative anemia however this seems to have improved and his energy level is better.  I saw Frank [name] back in the office [name] for [name]. He is doing exceedingly well. He's been active and working and traveling without any restrictions. He denies any chest pain or shortness of breath. He's had a small amount of weight gain however BMI is only 26. He feels better than he has in years he says. Recently we increased his cholesterol medication as his LDL was still not treated to goal. Particle number was just over 1100. He is due for recheck of this next month. He says he does get some mild muscle soreness after exercise that does not resolve quickly which he may attribute to increase in Crestor.  01/16/2016  Frank [name]. He reports that he is doing exceedingly well. His last use been excellent for him. He is exercising more regularly. He's had a small amount of weight gain but is less than 5 pounds and is starting to come down. He denies any chest pain or shortness of breath. His blood pressure is slightly higher recently however and he was started on losartan 50 mg daily.  Initial blood pressure [name] was 130/90 however recheck was 110/80 in the left arm and 112/82 in the right arm. He was concerned about some differential blood pressures in the past however this difference is negligible. In discussion [name] we realized he had not had an echocardiogram since his valve surgery and we need to establish a baseline valve gradient.  08/22/2016  Frank [name]. He says is feeling exceedingly well. He exercises regularly now and has a better energy level. He denies any shortness of breath or chest pain. He's been compliant with his INR checks and is Coumadin levels have been appropriate. Blood pressure appears well controlled. He has excellent cholesterol control with LDL in the 80s on rosuvastatin.   PMHx:  Past Medical History:  Diagnosis Date  . Congenital heart valve abnormality    bicuspid aortic valve  . Dysrhythmia    PVC's  . GERD (gastroesophageal reflux disease)   . Hypercholesterolemia   . PONV (postoperative nausea and vomiting)    after a tonsillectomy..none since    Past Surgical History:  Procedure Laterality Date  . ANTERIOR CRUCIATE LIGAMENT REPAIR Right 08/01/2014   Procedure: RECONSTRUCTION ANTERIOR CRUCIATE LIGAMENT (ACL) WITH HAMSTRING GRAFT, MENISCAL DEBRIDEMENT AS NEEDED.;  Surgeon: Meredith Pel, MD;  Location: [place];  Service: Orthopedics;  Laterality: Right;  . BENTALL PROCEDURE N/A 11/27/2014  Procedure: BENTALL PROCEDURE;  Surgeon: [name], MD;  Location: [place];  Service: Open Heart Surgery;  Laterality: N/A;  WITH CIRC ARREST  . CARDIAC CATHETERIZATION     10/26/2014  . CORONARY ANGIOGRAM  10/26/2014   Procedure: CORONARY ANGIOGRAM;  Surgeon: [name], MD;  Location: [facility] CATH LAB;  Service: Cardiovascular;;  . INGUINAL HERNIA REPAIR  2006   left, Dr. Bary Castilla  . TEE WITHOUT CARDIOVERSION N/A 08/01/2014   Procedure: TRANSESOPHAGEAL ECHOCARDIOGRAM (TEE);  Surgeon: Meredith Pel, MD;   Location: [place];  Service: Orthopedics;  Laterality: N/A;  . TEE WITHOUT CARDIOVERSION N/A 11/27/2014   Procedure: TRANSESOPHAGEAL ECHOCARDIOGRAM (TEE);  Surgeon: [name], MD;  Location: [place];  Service: Open Heart Surgery;  Laterality: N/A;  . TONSILLECTOMY  2001    FAMHx:  Family History  Problem Relation Age of Onset  . Arthritis Mother     psoriatis, Crohn's   . Heart disease Father     CAD in his early 91s    SOCHx:   reports that he has never smoked. He has never used smokeless tobacco. He reports that he drinks about 4.2 oz of alcohol per week . He reports that he does not use drugs.  ALLERGIES:  No Known Allergies  ROS: Pertinent items noted in HPI and remainder of comprehensive ROS otherwise negative.  HOME MEDS: Current Outpatient Prescriptions on File Prior to Visit  Medication Sig Dispense Refill  . ALPRAZolam (XANAX) 0.5 MG tablet Take 1 tablet (0.5 mg total) by mouth at bedtime as needed. 30 tablet 5  . Ascorbic Acid (VITAMIN C) 1000 MG tablet Take 1,000 mg by mouth daily.    Marland Kitchen losartan (COZAAR) 50 MG tablet Take 1 tablet (50 mg total) by mouth daily. 90 tablet 3  . metoprolol tartrate (LOPRESSOR) 25 MG tablet Take 1 tablet by mouth two  times daily 180 tablet 2  . rosuvastatin (CRESTOR) 20 MG tablet Take 2 tablets by mouth  daily 180 tablet 2  . warfarin (COUMADIN) 5 MG tablet TAKE 1 TABLET BY MOUTH  DAILY OR AS DIRECTED BY  COUMADIN CLINIC 90 tablet 0   No current facility-administered medications on file prior to visit.     LABS/IMAGING: No results found for this or any previous visit (from the past 48 hour(s)). No results found.  WEIGHTS: Wt Readings from Last 3 Encounters:  08/22/16 186 lb 6.4 oz (84.6 kg)  05/07/16 183 lb (83 kg)  01/16/16 187 lb (84.8 kg)    VITALS: BP 130/88   Pulse (!) 55   Ht 5\' 10"  (1.778 m)   Wt 186 lb 6.4 oz (84.6 kg)   BMI 26.75 kg/m   EXAM: General appearance: alert and no distress Neck: no carotid bruit and  no JVD Lungs: clear to auscultation bilaterally Heart: regular rate and rhythm, S1, S2 normal and Sharp mechanical valve sounds Abdomen: soft, non-tender; bowel sounds normal; no masses,  no organomegaly Extremities: extremities normal, atraumatic, no cyanosis or edema Pulses: 2+ and symmetric Skin: Skin color, texture, turgor normal. No rashes or lesions Neurologic: Grossly normal I: Pleasant  EKG: Sinus bradycardia at 55, nonspecific T wave changes  ASSESSMENT: 1. Status post aVR with the mechanical valve 2. Bentall hemi-arch repair for ascending aortic aneurysm 3. Mild nonobstructive coronary disease 4. Dyslipidemia 5. History of bicuspid aortic valve with aortopathy  PLAN: 1.   Frank [name] is doing well without any worsening shortness of breath or chest pain. His INR has  been stable. His cholesterol is well controlled. He had an echo 6 months ago which showed a normally functioning mechanical aortic valve. Plan to continue current medications and I'll see him back in [name] annually or sooner as necessary.  [name], MD, [facility] Attending Cardiologist [place] 08/22/2016, 1:41 PM

## 2016-08-28 ENCOUNTER — Ambulatory Visit: Payer: 59 | Admitting: Internal Medicine

## 2016-08-31 ENCOUNTER — Other Ambulatory Visit: Payer: Self-pay | Admitting: Internal Medicine

## 2016-09-05 ENCOUNTER — Telehealth: Payer: Self-pay

## 2016-09-05 MED ORDER — ALPRAZOLAM 0.5 MG PO TABS: 0.5000 mg | ORAL_TABLET | Freq: Every evening | ORAL | 5 refills | Status: DC | PRN

## 2016-09-05 NOTE — Telephone Encounter (Signed)
Refilled for local refill,  No 90 day mail order.

## 2016-09-05 NOTE — Telephone Encounter (Signed)
Refill request for Xanax, last seen [id number], last filled [id number].  Please advise.

## 2016-09-05 NOTE — Addendum Note (Signed)
Addended by: [name] on: 09/05/2016 03:22 PM   Modules accepted: Orders

## 2016-09-12 ENCOUNTER — Ambulatory Visit (INDEPENDENT_AMBULATORY_CARE_PROVIDER_SITE_OTHER): Payer: 59 | Admitting: Family Medicine

## 2016-09-12 ENCOUNTER — Encounter: Payer: Self-pay | Admitting: Family Medicine

## 2016-09-12 VITALS — BP 108/82 | HR 95 | Temp 100.9°F | Ht 70.0 in | Wt 187.7 lb

## 2016-09-12 DIAGNOSIS — J111 Influenza due to unidentified influenza virus with other respiratory manifestations: Secondary | ICD-10-CM | POA: Diagnosis not present

## 2016-09-12 MED ORDER — BENZONATATE 100 MG PO CAPS: 100.0000 mg | ORAL_CAPSULE | Freq: Two times a day (BID) | ORAL | 0 refills | Status: DC | PRN

## 2016-09-12 MED ORDER — OSELTAMIVIR PHOSPHATE 75 MG PO CAPS
75.0000 mg | ORAL_CAPSULE | Freq: Two times a day (BID) | ORAL | 0 refills | Status: DC
Start: 2016-09-12 — End: 2016-12-26

## 2016-09-12 NOTE — Progress Notes (Signed)
Pre visit review using our clinic review tool, if applicable. No additional management support is needed unless otherwise documented below in the visit note. 

## 2016-09-12 NOTE — Progress Notes (Signed)
HPI:  Flu like symptoms" -started: < 24 hours ago suddenly -symptoms:nasal congestion, cough, body aches and fever -denies:SOB, NVD, tooth pain, rash -has tried: tylenol -sick contacts/travel/risks: no reported flu, strep or tick exposure  ROS: See pertinent positives and negatives per HPI.  Past Medical History:  Diagnosis Date  . Congenital heart valve abnormality    bicuspid aortic valve  . Dysrhythmia    PVC's  . GERD (gastroesophageal reflux disease)   . Hypercholesterolemia   . PONV (postoperative nausea and vomiting)    after a tonsillectomy..none since    Past Surgical History:  Procedure Laterality Date  . ANTERIOR CRUCIATE LIGAMENT REPAIR Right 08/01/2014   Procedure: RECONSTRUCTION ANTERIOR CRUCIATE LIGAMENT (ACL) WITH HAMSTRING GRAFT, MENISCAL DEBRIDEMENT AS NEEDED.;  Surgeon: Meredith Pel, MD;  Location: [place];  Service: Orthopedics;  Laterality: Right;  . BENTALL PROCEDURE N/A 11/27/2014   Procedure: BENTALL PROCEDURE;  Surgeon: [name], MD;  Location: [place];  Service: Open Heart Surgery;  Laterality: N/A;  WITH CIRC ARREST  . CARDIAC CATHETERIZATION     10/26/2014  . CORONARY ANGIOGRAM  10/26/2014   Procedure: CORONARY ANGIOGRAM;  Surgeon: [name], MD;  Location: [facility] CATH LAB;  Service: Cardiovascular;;  . INGUINAL HERNIA REPAIR  2006   left, Dr. Bary Castilla  . TEE WITHOUT CARDIOVERSION N/A 08/01/2014   Procedure: TRANSESOPHAGEAL ECHOCARDIOGRAM (TEE);  Surgeon: Meredith Pel, MD;  Location: [place];  Service: Orthopedics;  Laterality: N/A;  . TEE WITHOUT CARDIOVERSION N/A 11/27/2014   Procedure: TRANSESOPHAGEAL ECHOCARDIOGRAM (TEE);  Surgeon: [name], MD;  Location: [place];  Service: Open Heart Surgery;  Laterality: N/A;  . TONSILLECTOMY  2001    Family History  Problem Relation Age of Onset  . Arthritis Mother     psoriatis, Crohn's   . Heart disease Father     CAD in his early 74s    Social History   Social History  .  Marital status: Married    Spouse name: N/A  . Number of children: N/A  . Years of education: N/A   Occupational History  . 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 Social research officer, government    Social History Main Topics  . Smoking status: Never Smoker  . Smokeless tobacco: Never Used  . Alcohol use 4.2 oz/week    7 Glasses of wine per week     Comment: occasional, drink 1 beer per wk  . Drug use: No  . Sexual activity: Yes   Other Topics Concern  . None   Social History Narrative   Pt has a dog.   Exercises regularly- isometrics, no running.     Current Outpatient Prescriptions:  .  ALPRAZolam (XANAX) 0.5 MG tablet, Take 1 tablet (0.5 mg total) by mouth at bedtime as needed., Disp: 30 tablet, Rfl: 5 .  Ascorbic Acid (VITAMIN C) 1000 MG tablet, Take 1,000 mg by mouth daily., Disp: , Rfl:  .  losartan (COZAAR) 50 MG tablet, Take 1 tablet (50 mg total) by mouth daily., Disp: 90 tablet, Rfl: 3 .  metoprolol tartrate (LOPRESSOR) 25 MG tablet, Take 1 tablet by mouth two  times daily, Disp: 180 tablet, Rfl: 2 .  rosuvastatin (CRESTOR) 20 MG tablet, Take 2 tablets by mouth  daily, Disp: 180 tablet, Rfl: 2 .  warfarin (COUMADIN) 5 MG tablet, Take 5 mg (1 tablet) every Monday, Wednesday and Friday and 2.5mg  (1/2 tablet) all other days of the week OR  as directed by coumadin clinic., Disp: 60 tablet, Rfl: 1 .  benzonatate (TESSALON) 100 MG capsule, Take 1 capsule (100 mg total) by mouth 2 (two) times daily as needed for cough., Disp: 20 capsule, Rfl: 0 .  oseltamivir (TAMIFLU) 75 MG capsule, Take 1 capsule (75 mg total) by mouth 2 (two) times daily., Disp: 10 capsule, Rfl: 0  EXAM:  Vitals:   09/12/16 1531  BP: 108/82  Pulse: 95  Temp: (!) 100.9 F (38.3 C)    Body mass index is 26.93 kg/m.  GENERAL: vitals reviewed and listed above, alert, oriented, appears well hydrated and in no acute distress  HEENT: atraumatic, conjunttiva clear, no obvious abnormalities on  inspection of external nose and ears, normal appearance of ear canals and TMs, clear nasal congestion, mild post oropharyngeal erythema with PND, no tonsillar edema or exudate, no sinus TTP  NECK: no obvious masses on inspection  LUNGS: clear to auscultation bilaterally, no wheezes, rales or rhonchi, good air movement  CV: HRRR, no peripheral edema  MS: moves all extremities without noticeable abnormality  PSYCH: pleasant and cooperative, no obvious depression or anxiety  ASSESSMENT AND PLAN:  Discussed the following assessment and plan:  Influenza  -flu likely -discussed limitations testing, treatment options and risks, potential complications, symptomatic care and return precuations -he requests tamiflu as his wife is very anxious after watching the news - rx sent after discussion risks, indications, use, pot benefit -of course, we advised to return or notify a doctor immediately if symptoms worsen or persist or new concerns arise.  Declined AVS  There are no Patient Instructions on file for this visit.  Colin Benton R., DO

## 2016-09-27 ENCOUNTER — Other Ambulatory Visit: Payer: Self-pay | Admitting: Family Medicine

## 2016-10-10 ENCOUNTER — Encounter: Payer: Self-pay | Admitting: Internal Medicine

## 2016-10-10 DIAGNOSIS — R7989 Other specified abnormal findings of blood chemistry: Secondary | ICD-10-CM

## 2016-10-15 ENCOUNTER — Other Ambulatory Visit: Payer: Self-pay | Admitting: Internal Medicine

## 2016-10-15 ENCOUNTER — Telehealth: Payer: Self-pay | Admitting: *Deleted

## 2016-10-15 NOTE — Telephone Encounter (Signed)
Pt has requested a paper copy of his lab orders. Pt stated that [facility] can not see his lab orders. Pt contact [phone]

## 2016-10-15 NOTE — Telephone Encounter (Signed)
Lab order requisition printed and faxed to pt [phone] and placed upfront for pt to p/u per his request.

## 2016-10-16 ENCOUNTER — Encounter: Payer: Self-pay | Admitting: Internal Medicine

## 2016-10-16 ENCOUNTER — Ambulatory Visit (INDEPENDENT_AMBULATORY_CARE_PROVIDER_SITE_OTHER): Payer: 59 | Admitting: Pharmacist

## 2016-10-16 DIAGNOSIS — Z954 Presence of other heart-valve replacement: Secondary | ICD-10-CM

## 2016-10-16 LAB — PROTIME-INR
INR: 1.7 — ABNORMAL HIGH (ref 0.8–1.2)
PROTHROMBIN TIME: 17.5 s — AB (ref 9.1–12.0)

## 2016-10-16 LAB — T4 AND TSH
T4, Total: 6.5 ug/dL (ref 4.5–12.0)
TSH: 4.38 u[IU]/mL (ref 0.450–4.500)

## 2016-11-12 ENCOUNTER — Other Ambulatory Visit: Payer: Self-pay | Admitting: Internal Medicine

## 2016-11-13 LAB — PROTIME-INR
INR: 1.9 — ABNORMAL HIGH (ref 0.8–1.2)
Prothrombin Time: 19.2 s — ABNORMAL HIGH (ref 9.1–12.0)

## 2016-11-19 ENCOUNTER — Other Ambulatory Visit: Payer: Self-pay | Admitting: Internal Medicine

## 2016-11-20 NOTE — Telephone Encounter (Signed)
REFILL 

## 2016-12-02 ENCOUNTER — Ambulatory Visit (INDEPENDENT_AMBULATORY_CARE_PROVIDER_SITE_OTHER): Payer: 59 | Admitting: Pharmacist Clinician (PhC)/ Clinical Pharmacy Specialist

## 2016-12-02 DIAGNOSIS — Z954 Presence of other heart-valve replacement: Secondary | ICD-10-CM

## 2016-12-08 ENCOUNTER — Other Ambulatory Visit: Payer: Self-pay | Admitting: Internal Medicine

## 2016-12-15 ENCOUNTER — Other Ambulatory Visit: Payer: Self-pay | Admitting: Internal Medicine

## 2016-12-16 ENCOUNTER — Ambulatory Visit (INDEPENDENT_AMBULATORY_CARE_PROVIDER_SITE_OTHER): Payer: 59 | Admitting: Pharmacist Clinician (PhC)/ Clinical Pharmacy Specialist

## 2016-12-16 DIAGNOSIS — Z954 Presence of other heart-valve replacement: Secondary | ICD-10-CM

## 2016-12-16 LAB — PROTIME-INR
INR: 2.2 — ABNORMAL HIGH (ref 0.8–1.2)
Prothrombin Time: 22.6 s — ABNORMAL HIGH (ref 9.1–12.0)

## 2016-12-26 ENCOUNTER — Ambulatory Visit (INDEPENDENT_AMBULATORY_CARE_PROVIDER_SITE_OTHER): Payer: 59 | Admitting: Family Medicine

## 2016-12-26 ENCOUNTER — Encounter: Payer: Self-pay | Admitting: Family Medicine

## 2016-12-26 DIAGNOSIS — J0100 Acute maxillary sinusitis, unspecified: Secondary | ICD-10-CM

## 2016-12-26 DIAGNOSIS — J019 Acute sinusitis, unspecified: Secondary | ICD-10-CM | POA: Insufficient documentation

## 2016-12-26 MED ORDER — AMOXICILLIN-POT CLAVULANATE 875-125 MG PO TABS: 1.0000 | ORAL_TABLET | Freq: Two times a day (BID) | ORAL | 0 refills | Status: DC

## 2016-12-26 NOTE — Assessment & Plan Note (Signed)
New problem. Treating with Augmentin. Advised him to have an INR check next week.

## 2016-12-26 NOTE — Patient Instructions (Signed)
Antibiotic as prescribed.  Be sure to get your INR.  Take care  Dr. Lacinda Axon    Sinusitis, Adult Sinusitis is soreness and inflammation of your sinuses. Sinuses are hollow spaces in the bones around your face. Your sinuses are located:  Around your eyes.  In the middle of your forehead.  Behind your nose.  In your cheekbones. Your sinuses and nasal passages are lined with a stringy fluid (mucus). Mucus normally drains out of your sinuses. When your nasal tissues become inflamed or swollen, the mucus can become trapped or blocked so air cannot flow through your sinuses. This allows bacteria, viruses, and funguses to grow, which leads to infection. Sinusitis can develop quickly and last for 7?10 days (acute) or for more than 12 weeks (chronic). Sinusitis often develops after a cold. What are the causes? This condition is caused by anything that creates swelling in the sinuses or stops mucus from draining, including:  Allergies.  Asthma.  Bacterial or viral infection.  Abnormally shaped bones between the nasal passages.  Nasal growths that contain mucus (nasal polyps).  Narrow sinus openings.  Pollutants, such as chemicals or irritants in the air.  A foreign object stuck in the nose.  A fungal infection. This is rare. What increases the risk? The following factors may make you more likely to develop this condition:  Having allergies or asthma.  Having had a recent cold or respiratory tract infection.  Having structural deformities or blockages in your nose or sinuses.  Having a weak immune system.  Doing a lot of swimming or diving.  Overusing nasal sprays.  Smoking. What are the signs or symptoms? The main symptoms of this condition are pain and a feeling of pressure around the affected sinuses. Other symptoms include:  Upper toothache.  Earache.  Headache.  Bad breath.  Decreased sense of smell and taste.  A cough that may get worse at  night.  Fatigue.  Fever.  Thick drainage from your nose. The drainage is often green and it may contain pus (purulent).  Stuffy nose or congestion.  Postnasal drip. This is when extra mucus collects in the throat or back of the nose.  Swelling and warmth over the affected sinuses.  Sore throat.  Sensitivity to light. How is this diagnosed? This condition is diagnosed based on symptoms, a medical history, and a physical exam. To find out if your condition is acute or chronic, your health care provider may:  Look in your nose for signs of nasal polyps.  Tap over the affected sinus to check for signs of infection.  View the inside of your sinuses using an imaging device that has a light attached (endoscope). If your health care provider suspects that you have chronic sinusitis, you may also:  Be tested for allergies.  Have a sample of mucus taken from your nose (nasal culture) and checked for bacteria.  Have a mucus sample examined to see if your sinusitis is related to an allergy. If your sinusitis does not respond to treatment and it lasts longer than 8 weeks, you may have an MRI or CT scan to check your sinuses. These scans also help to determine how severe your infection is. In rare cases, a bone biopsy may be done to rule out more serious types of fungal sinus disease. How is this treated? Treatment for sinusitis depends on the cause and whether your condition is chronic or acute. If a virus is causing your sinusitis, your symptoms will go away on their  own within 10 days. You may be given medicines to relieve your symptoms, including:  Topical nasal decongestants. They shrink swollen nasal passages and let mucus drain from your sinuses.  Antihistamines. These drugs block inflammation that is triggered by allergies. This can help to ease swelling in your nose and sinuses.  Topical nasal corticosteroids. These are nasal sprays that ease inflammation and swelling in your nose  and sinuses.  Nasal saline washes. These rinses can help to get rid of thick mucus in your nose. If your condition is caused by bacteria, you will be given an antibiotic medicine. If your condition is caused by a fungus, you will be given an antifungal medicine. Surgery may be needed to correct underlying conditions, such as narrow nasal passages. Surgery may also be needed to remove polyps. Follow these instructions at home: Medicines   Take, use, or apply over-the-counter and prescription medicines only as told by your health care provider. These may include nasal sprays.  If you were prescribed an antibiotic medicine, take it as told by your health care provider. Do not stop taking the antibiotic even if you start to feel better. Hydrate and Humidify   Drink enough water to keep your urine clear or pale yellow. Staying hydrated will help to thin your mucus.  Use a cool mist humidifier to keep the humidity level in your home above 50%.  Inhale steam for 10-15 minutes, 3-4 times a day or as told by your health care provider. You can do this in the bathroom while a hot shower is running.  Limit your exposure to cool or dry air. Rest   Rest as much as possible.  Sleep with your head raised (elevated).  Make sure to get enough sleep each night. General instructions   Apply a warm, moist washcloth to your face 3-4 times a day or as told by your health care provider. This will help with discomfort.  Wash your hands often with soap and water to reduce your exposure to viruses and other germs. If soap and water are not available, use hand sanitizer.  Do not smoke. Avoid being around people who are smoking (secondhand smoke).  Keep all follow-up visits as told by your health care provider. This is important. Contact a health care provider if:  You have a fever.  Your symptoms get worse.  Your symptoms do not improve within 10 days. Get help right away if:  You have a severe  headache.  You have persistent vomiting.  You have pain or swelling around your face or eyes.  You have vision problems.  You develop confusion.  Your neck is stiff.  You have trouble breathing. This information is not intended to replace advice given to you by your health care provider. Make sure you discuss any questions you have with your health care provider. Document Released: 08/04/2005 Document Revised: 03/30/2016 Document Reviewed: 05/30/2015 Elsevier Interactive Patient Education  2017 [employer].

## 2016-12-26 NOTE — Progress Notes (Signed)
Subjective:  Patient ID: Frank [name], male    DOB: [birth year]  Age: 51 y.o. MRN: [id number]  CC: Concern for sinusitis  HPI:  51 year old male with CAD, hypertension, hyperlipidemia, bicuspid aortic valve status post repair now on chronic anticoagulation due to mechanical valve presents with the above complaint.  Patient reports that he's had severe right frontal and right maxillary sinus pain and pressure since Monday. He's been using over-the-counter Tylenol without significant improvement. Associated congestion. No fever. Has been worsening. Minimal discharge from the nose. No known exacerbating factors. No other associated symptoms. No other complaints or concerns at this time.  Social Hx   Social History   Social History  . Marital status: Married    Spouse name: N/A  . Number of children: N/A  . Years of education: N/A   Occupational History  . 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 Social research officer, government    Social History Main Topics  . Smoking status: Never Smoker  . Smokeless tobacco: Never Used  . Alcohol use 4.2 oz/week    7 Glasses of wine per week     Comment: occasional, drink 1 beer per wk  . Drug use: No  . Sexual activity: Yes   Other Topics Concern  . None   Social History Narrative   Pt has a dog.   Exercises regularly- isometrics, no running.    Review of Systems  Constitutional: Negative for fever.  HENT: Positive for congestion, sinus pain and sinus pressure.    Objective:  BP 130/90   Pulse (!) 54   Temp 97.9 F (36.6 C) (Oral)   Wt 187 lb 6.4 oz (85 kg)   SpO2 98%   BMI 26.89 kg/m   BP/Weight 12/26/2016 6/72/0947 0/04/6282  Systolic BP 662 947 654  Diastolic BP 90 82 88  Wt. (Lbs) 187.4 187.7 186.4  BMI 26.89 26.93 26.75    Physical Exam  Constitutional: He is oriented to person, place, and time. He appears well-developed. No distress.  HENT:  Mouth/Throat: Oropharynx is clear and moist.  Severe right  maxillary sinus tenderness to palpation.  Cardiovascular: Regular rhythm.   Bradycardic. Murmur noted.  Pulmonary/Chest: He has no wheezes. He has no rales.  Neurological: He is alert and oriented to person, place, and time.  Psychiatric: He has a normal mood and affect.  Vitals reviewed.   Lab Results  Component Value Date   WBC 7.2 05/08/2016   HGB 8.8 (L) 12/01/2014   HCT 45.0 05/08/2016   PLT 227 05/08/2016   GLUCOSE 92 05/08/2016   CHOL 143 05/08/2016   TRIG 130 05/08/2016   HDL 43 05/08/2016   LDLCALC 74 05/08/2016   ALT 29 05/08/2016   AST 31 05/08/2016   NA 139 05/08/2016   K 4.8 05/08/2016   CL 100 05/08/2016   CREATININE 1.24 05/08/2016   BUN 13 05/08/2016   CO2 24 05/08/2016   TSH 4.380 10/15/2016   PSA 0.4 04/28/2012   INR 2.2 (H) 12/15/2016   HGBA1C 5.8 (H) 11/23/2014    Assessment & Plan:   Problem List Items Addressed This Visit    Acute sinusitis    New problem. Treating with Augmentin. Advised him to have an INR check next week.      Relevant Medications   amoxicillin-clavulanate (AUGMENTIN) 875-125 MG tablet      Meds ordered this encounter  Medications  . amoxicillin-clavulanate (AUGMENTIN) 875-125 MG tablet    Sig: Take  1 tablet by mouth 2 (two) times daily.    Dispense:  20 tablet    Refill:  0     Follow-up: PRN  [place]

## 2017-01-07 ENCOUNTER — Ambulatory Visit: Payer: 59 | Admitting: Internal Medicine

## 2017-01-14 ENCOUNTER — Other Ambulatory Visit: Payer: Self-pay | Admitting: Internal Medicine

## 2017-01-15 ENCOUNTER — Telehealth: Payer: Self-pay | Admitting: Internal Medicine

## 2017-01-15 ENCOUNTER — Ambulatory Visit (INDEPENDENT_AMBULATORY_CARE_PROVIDER_SITE_OTHER): Payer: 59 | Admitting: Pharmacist

## 2017-01-15 DIAGNOSIS — Z954 Presence of other heart-valve replacement: Secondary | ICD-10-CM

## 2017-01-15 LAB — PROTIME-INR
INR: 2.4 — AB (ref 0.8–1.2)
PROTHROMBIN TIME: 24.4 s — AB (ref 9.1–12.0)

## 2017-01-15 NOTE — Telephone Encounter (Signed)
Faxed signed standing orders for biweekly PT/INRs for [facility]

## 2017-02-02 ENCOUNTER — Other Ambulatory Visit: Payer: Self-pay

## 2017-02-02 ENCOUNTER — Ambulatory Visit (HOSPITAL_COMMUNITY): Payer: 59 | Attending: Cardiology

## 2017-02-02 DIAGNOSIS — Z9889 Other specified postprocedural states: Secondary | ICD-10-CM | POA: Insufficient documentation

## 2017-02-02 DIAGNOSIS — I340 Nonrheumatic mitral (valve) insufficiency: Secondary | ICD-10-CM | POA: Diagnosis not present

## 2017-02-02 DIAGNOSIS — Z954 Presence of other heart-valve replacement: Secondary | ICD-10-CM | POA: Diagnosis not present

## 2017-02-02 DIAGNOSIS — Z952 Presence of prosthetic heart valve: Secondary | ICD-10-CM | POA: Insufficient documentation

## 2017-02-23 ENCOUNTER — Other Ambulatory Visit: Payer: Self-pay | Admitting: Internal Medicine

## 2017-02-24 LAB — PROTIME-INR
INR: 1.8 — ABNORMAL HIGH (ref 0.8–1.2)
Prothrombin Time: 18.6 s — ABNORMAL HIGH (ref 9.1–12.0)

## 2017-02-26 ENCOUNTER — Other Ambulatory Visit: Payer: Self-pay | Admitting: Internal Medicine

## 2017-03-13 ENCOUNTER — Other Ambulatory Visit: Payer: Self-pay | Admitting: Internal Medicine

## 2017-03-16 NOTE — Telephone Encounter (Signed)
Xanax 0.5 mg Rx request LOV: 05/07/2016 Next OV: 05/15/2017 Last refilled 09/05/2016

## 2017-03-16 NOTE — Telephone Encounter (Signed)
Printed, signed and faxed.  

## 2017-03-16 NOTE — Telephone Encounter (Signed)
Refilled for prn use  

## 2017-03-23 ENCOUNTER — Other Ambulatory Visit: Payer: Self-pay | Admitting: Internal Medicine

## 2017-03-24 LAB — PROTIME-INR
INR: 2.4 — AB (ref 0.8–1.2)
Prothrombin Time: 24 s — ABNORMAL HIGH (ref 9.1–12.0)

## 2017-04-16 ENCOUNTER — Telehealth: Payer: Self-pay | Admitting: Pharmacist Clinician (PhC)/ Clinical Pharmacy Specialist

## 2017-04-16 NOTE — Telephone Encounter (Signed)
Coumadin letter 

## 2017-05-08 ENCOUNTER — Encounter: Payer: 59 | Admitting: Internal Medicine

## 2017-05-13 ENCOUNTER — Other Ambulatory Visit: Payer: Self-pay | Admitting: Internal Medicine

## 2017-05-13 NOTE — Telephone Encounter (Signed)
Error

## 2017-05-13 NOTE — Progress Notes (Signed)
Error

## 2017-05-14 LAB — PROTIME-INR
INR: 2.5 — ABNORMAL HIGH (ref 0.8–1.2)
Prothrombin Time: 24.7 s — ABNORMAL HIGH (ref 9.1–12.0)

## 2017-05-14 NOTE — Telephone Encounter (Signed)
Error

## 2017-05-15 ENCOUNTER — Ambulatory Visit (INDEPENDENT_AMBULATORY_CARE_PROVIDER_SITE_OTHER): Payer: 59 | Admitting: Internal Medicine

## 2017-05-15 ENCOUNTER — Encounter: Payer: Self-pay | Admitting: Internal Medicine

## 2017-05-15 VITALS — BP 156/90 | HR 58 | Temp 97.7°F | Resp 15 | Ht 70.0 in | Wt 182.8 lb

## 2017-05-15 DIAGNOSIS — E785 Hyperlipidemia, unspecified: Secondary | ICD-10-CM

## 2017-05-15 DIAGNOSIS — R5383 Other fatigue: Secondary | ICD-10-CM | POA: Diagnosis not present

## 2017-05-15 DIAGNOSIS — E559 Vitamin D deficiency, unspecified: Secondary | ICD-10-CM | POA: Diagnosis not present

## 2017-05-15 DIAGNOSIS — Z0000 Encounter for general adult medical examination without abnormal findings: Secondary | ICD-10-CM

## 2017-05-15 DIAGNOSIS — E663 Overweight: Secondary | ICD-10-CM

## 2017-05-15 DIAGNOSIS — R6884 Jaw pain: Secondary | ICD-10-CM

## 2017-05-15 DIAGNOSIS — I10 Essential (primary) hypertension: Secondary | ICD-10-CM | POA: Diagnosis not present

## 2017-05-15 DIAGNOSIS — N529 Male erectile dysfunction, unspecified: Secondary | ICD-10-CM

## 2017-05-15 DIAGNOSIS — Z23 Encounter for immunization: Secondary | ICD-10-CM | POA: Diagnosis not present

## 2017-05-15 NOTE — Patient Instructions (Addendum)
Referral to [facility] for rectal bleeding  Check bp at home several times if systolic is > 151 we can increase losartan to 100 mg    You should try [employer]'s Sinus rinse ;  It is a stong sinus "flush" using water and medicated salts.  Do it over the sink because it can be a bit messy  If sinus infection symptoms ,  return call me for discussion of abx vs CT scan of sinuses    Health Maintenance, Male A healthy lifestyle and preventive care is important for your health and wellness. Ask your health care provider about what schedule of regular examinations is right for you. What should I know about weight and diet? Eat a Healthy Diet  Eat plenty of vegetables, fruits, whole grains, low-fat dairy products, and lean protein.  Do not eat a lot of foods high in solid fats, added sugars, or salt.  Maintain a Healthy Weight Regular exercise can help you achieve or maintain a healthy weight. You should:  Do at least 150 minutes of exercise each week. The exercise should increase your heart rate and make you sweat (moderate-intensity exercise).  Do strength-training exercises at least twice a week.  Watch Your Levels of Cholesterol and Blood Lipids  Have your blood tested for lipids and cholesterol every 5 years starting at 52 years of age. If you are at high risk for heart disease, you should start having your blood tested when you are 52 years old. You may need to have your cholesterol levels checked more often if: ? Your lipid or cholesterol levels are high. ? You are older than 52 years of age. ? You are at high risk for heart disease.  What should I know about cancer screening? Many types of cancers can be detected early and may often be prevented. Lung Cancer  You should be screened every year for lung cancer if: ? You are a current smoker who has smoked for at least 30 years. ? You are a former smoker who has quit within the past 15 years.  Talk to your health care provider  about your screening options, when you should start screening, and how often you should be screened.  Colorectal Cancer  Routine colorectal cancer screening usually begins at 52 years of age and should be repeated every 5-10 years until you are 52 years old. You may need to be screened more often if early forms of precancerous polyps or small growths are found. Your health care provider may recommend screening at an earlier age if you have risk factors for colon cancer.  Your health care provider may recommend using home test kits to check for hidden blood in the stool.  A small camera at the end of a tube can be used to examine your colon (sigmoidoscopy or colonoscopy). This checks for the earliest forms of colorectal cancer.  Prostate and Testicular Cancer  Depending on your age and overall health, your health care provider may do certain tests to screen for prostate and testicular cancer.  Talk to your health care provider about any symptoms or concerns you have about testicular or prostate cancer.  Skin Cancer  Check your skin from head to toe regularly.  Tell your health care provider about any new moles or changes in moles, especially if: ? There is a change in a mole's size, shape, or color. ? You have a mole that is larger than a pencil eraser.  Always use sunscreen. Apply sunscreen liberally and  repeat throughout the day.  Protect yourself by wearing long sleeves, pants, a wide-brimmed hat, and sunglasses when outside.  What should I know about heart disease, diabetes, and high blood pressure?  If you are 86-60 years of age, have your blood pressure checked every 3-5 years. If you are 64 years of age or older, have your blood pressure checked every year. You should have your blood pressure measured twice-once when you are at a hospital or clinic, and once when you are not at a hospital or clinic. Record the average of the two measurements. To check your blood pressure when you  are not at a hospital or clinic, you can use: ? An automated blood pressure machine at a pharmacy. ? A home blood pressure monitor.  Talk to your health care provider about your target blood pressure.  If you are between 30-14 years old, ask your health care provider if you should take aspirin to prevent heart disease.  Have regular diabetes screenings by checking your fasting blood sugar level. ? If you are at a normal weight and have a low risk for diabetes, have this test once every three years after the age of 19. ? If you are overweight and have a high risk for diabetes, consider being tested at a younger age or more often.  A one-time screening for abdominal aortic aneurysm (AAA) by ultrasound is recommended for men aged 2-75 years who are current or former smokers. What should I know about preventing infection? Hepatitis B If you have a higher risk for hepatitis B, you should be screened for this virus. Talk with your health care provider to find out if you are at risk for hepatitis B infection. Hepatitis C Blood testing is recommended for:  Everyone born from [birth year] through 1966.  Anyone with known risk factors for hepatitis C.  Sexually Transmitted Diseases (STDs)  You should be screened each year for STDs including gonorrhea and chlamydia if: ? You are sexually active and are younger than 52 years of age. ? You are older than 52 years of age and your health care provider tells you that you are at risk for this type of infection. ? Your sexual activity has changed since you were last screened and you are at an increased risk for chlamydia or gonorrhea. Ask your health care provider if you are at risk.  Talk with your health care provider about whether you are at high risk of being infected with HIV. Your health care provider may recommend a prescription medicine to help prevent HIV infection.  What else can I do?  Schedule regular health, dental, and eye exams.  Stay  current with your vaccines (immunizations).  Do not use any tobacco products, such as cigarettes, chewing tobacco, and e-cigarettes. If you need help quitting, ask your health care provider.  Limit alcohol intake to no more than 2 drinks per day. One drink equals 12 ounces of beer, 5 ounces of wine, or 1 ounces of hard liquor.  Do not use street drugs.  Do not share needles.  Ask your health care provider for help if you need support or information about quitting drugs.  Tell your health care provider if you often feel depressed.  Tell your health care provider if you have ever been abused or do not feel safe at home. This information is not intended to replace advice given to you by your health care provider. Make sure you discuss any questions you have with your health care  provider. Document Released: 01/31/2008 Document Revised: 04/02/2016 Document Reviewed: 05/08/2015 Elsevier Interactive Patient Education  [employer].

## 2017-05-15 NOTE — Progress Notes (Signed)
Patient ID: CASHTYN POULIOT, male    DOB: 08-12-1966  Age: 51 y.o. MRN: [id number]  The patient is here for annual  Preventive examination and management of other chronic and acute problems.   The risk factors are reflected in the social history.  The roster of all physicians providing medical care to patient - is listed in the Snapshot section of the chart.  Activities of daily living:  The patient is 100% independent in all ADLs: dressing, toileting, feeding as well as independent mobility  Home safety : The patient has smoke detectors in the home. They wear seatbelts.  There are no firearms at home. There is no violence in the home.   There is no risks for hepatitis, STDs or HIV. There is no   history of blood transfusion. They have no travel history to infectious disease endemic areas of the world.  The patient has seen their dentist in the last six month. T  Discussed the need for sun protection: hats, long sleeves and use of sunscreen if there is significant sun exposure.   Diet: the importance of a healthy diet is discussed. They do have a healthy diet.  The benefits of regular aerobic exercise were discussed. She walks 4 times per week ,  20 minutes.   Depression screen: there are no signs or vegative symptoms of depression- irritability, change in appetite, anhedonia, sadness/tearfullness.   The following portions of the patient's history were reviewed and updated as appropriate: allergies, current medications, past family history, past medical history,  past surgical history, past social history  and problem list.  Visual acuity was not assessed per patient preference since she has regular follow up with her ophthalmologist. Hearing and body mass index were assessed and reviewed.   During the course of the visit the patient was educated and counseled about appropriate screening and preventive services including : fall prevention , diabetes screening, nutrition counseling,  colorectal cancer screening, and recommended immunizations.    CC: The primary encounter diagnosis was Dyslipidemia. Diagnoses of Need for immunization against influenza, Essential hypertension, Erectile dysfunction, unspecified erectile dysfunction type, Fatigue, unspecified type, Vitamin D deficiency, Overweight (BMI 25.0-29.9), Encounter for preventive health examination, and Maxillary pain were also pertinent to this visit.  Marland Kitchen    1) on coumadin  2.5 alt with 5 ng for aortic valve repair .   2) chronic rectal bleeding. last colonoscopy 7 yrs ago.  Bleeding starts after his daily 5 mile walk and accompanied by a feeling a rectal irritation .  May be an internal hemorrhoid but recrords are  Unavailable from [facility] .      Discussed referral to [facility].   .  2) overweight:  Has lost 5 lbs   Cut out ice cream and now walking for 45 minutes daily   3) insomnia:  Secondary to work stress:  Using alprazolam 1/2 tablet at the beginning.  Walking up 3-4 hours later  . 2nd dose makes him hung over in the morning   4) fatigue: wants testosterone level checked .  difficulty with ED.  5) persistent right maxillary pain since treatment for actue bacterial sinusitis several months ago.  No rhinitis,  Dental exam reportedly normal   In May  6) elevated blood pressure,   has not been checking blood pressure lately at home.    History Sharvil has a past medical history of Congenital heart valve abnormality; Dysrhythmia; GERD (gastroesophageal reflux disease); Hypercholesterolemia; and PONV (postoperative nausea and vomiting).   He  has a past surgical history that includes Inguinal hernia repair (2006); Tonsillectomy (2001); Anterior cruciate ligament repair (Right, 08/01/2014); TEE without cardioversion (N/A, 08/01/2014); coronary angiogram (10/26/2014); Cardiac catheterization; Bentall procedure (N/A, 11/27/2014); and TEE without cardioversion (N/A, 11/27/2014).   His family history includes Arthritis in his  mother; Heart disease in his father.He reports that he has never smoked. He has never used smokeless tobacco. He reports that he drinks about 4.2 oz of alcohol per week . He reports that he does not use drugs.  Outpatient Medications Prior to Visit  Medication Sig Dispense Refill  . ALPRAZolam (XANAX) 0.5 MG tablet TAKE 1 TABLET AT BEDTIME AS NEEDED 30 tablet 1  . Ascorbic Acid (VITAMIN C) 1000 MG tablet Take 1,000 mg by mouth daily.    Marland Kitchen losartan (COZAAR) 50 MG tablet TAKE 1 TABLET BY MOUTH  DAILY 90 tablet 3  . metoprolol tartrate (LOPRESSOR) 25 MG tablet TAKE 1 TABLET BY MOUTH TWO  TIMES DAILY 180 tablet 3  . rosuvastatin (CRESTOR) 20 MG tablet TAKE 2 TABLETS BY MOUTH  DAILY 180 tablet 2  . warfarin (COUMADIN) 5 MG tablet Take 1/2 to 1 tablet by mouth daily as directed by coumadin clinic 90 tablet 0  . amoxicillin-clavulanate (AUGMENTIN) 875-125 MG tablet Take 1 tablet by mouth 2 (two) times daily. (Patient not taking: Reported on 05/15/2017) 20 tablet 0  . benzonatate (TESSALON) 100 MG capsule Take 1 capsule (100 mg total) by mouth 2 (two) times daily as needed for cough. 20 capsule 0   No facility-administered medications prior to visit.     Review of Systems   Patient denies headache, fevers, malaise, unintentional weight loss, skin rash, eye pain, sinus congestion and sinus pain, sore throat, dysphagia,  hemoptysis , cough, dyspnea, wheezing, chest pain, palpitations, orthopnea, edema, abdominal pain, nausea, melena, diarrhea, constipation, flank pain, dysuria, hematuria, urinary  Frequency, nocturia, numbness, tingling, seizures,  Focal weakness, Loss of consciousness,  Tremor, insomnia, depression, anxiety, and suicidal ideation.      Objective:  BP (!) 156/90 (BP Location: Left Arm, Patient Position: Sitting, Cuff Size: Normal)   Pulse (!) 58   Temp 97.7 F (36.5 C) (Oral)   Resp 15   Ht 5\' 10"  (1.778 m)   Wt 182 lb 12.8 oz (82.9 kg)   SpO2 98%   BMI 26.23 kg/m   Physical  Exam   General appearance: alert, cooperative and appears stated age Ears: normal TM's and external ear canals both ears Throat: lips, mucosa, and tongue normal; teeth and gums normal Neck: no adenopathy, no carotid bruit, supple, symmetrical, trachea midline and thyroid not enlarged, symmetric, no tenderness/mass/nodules Back: symmetric, no curvature. ROM normal. No CVA tenderness. Lungs: clear to auscultation bilaterally Heart: regular rate and rhythm, S1, S2 normal, prosthetic aortic valve click, no rub or gallop Abdomen: soft, non-tender; bowel sounds normal; no masses,  no organomegaly Pulses: 2+ and symmetric Skin: Skin color, texture, turgor normal. No rashes or lesions Rectal: small internal hemorrhoid at 7:00 position non tender Lymph nodes: Cervical, supraclavicular, and axillary nodes normal.    Assessment & Plan:   Problem List Items Addressed This Visit    Dyslipidemia - Primary   Relevant Orders   Lipid panel   Encounter for preventive health examination    Annual comprehensive preventive exam was done as well as an evaluation and management of acute and chronic conditions .  During the course of the visit the patient was educated and counseled about appropriate screening and preventive  services including :  diabetes screening, lipid analysis with projected  10 year  risk for CAD , nutrition counseling, prostate and colorectal cancer screening, and recommended immunizations.  Printed recommendations for health maintenance screenings was given.       Erectile dysfunction    May be medication side effect of metoprolol.  He has requested testosterone level be checked.       Relevant Orders   Testosterone, Free, Total, SHBG   Essential hypertension    he reports compliance with medication regimen  but has an elevated reading today in office.  He has been asked to check his BP at work and  submit readings for evaluation. Renal function will be checked      Relevant  Orders   Comprehensive metabolic panel   Maxillary pain    Right side,  Since May when he was treated for bacterial sinusitis. All other symptoms of sinusitis have resolved,  Recommend saline flushes as a trial , if persistent,  cT sinuses needed      Overweight (BMI 25.0-29.9)    I have congratulated him in reduction of   BMI and encouraged  Continued weight loss with goal of 10% of body weigh over the next 6 months using a low glycemic index diet and regular exercise a minimum of 5 days per week.         Other Visit Diagnoses    Need for immunization against influenza       Relevant Orders   Flu Vaccine QUAD 36+ mos IM (Completed)   Fatigue, unspecified type       Relevant Orders   TSH   Vitamin B12   CBC with Differential/Platelet   Ferritin   Iron Binding Cap (TIBC)   Vitamin D deficiency       Relevant Orders   VITAMIN D 25 Hydroxy (Vit-D Deficiency, Fractures)      I have discontinued Mr. Kahan's benzonatate and amoxicillin-clavulanate. I am also having him maintain his vitamin C, metoprolol tartrate, losartan, rosuvastatin, warfarin, and ALPRAZolam.  No orders of the defined types were placed in this encounter.   Medications Discontinued During This Encounter  Medication Reason  . amoxicillin-clavulanate (AUGMENTIN) 875-125 MG tablet Patient has not taken in last 30 days  . benzonatate (TESSALON) 100 MG capsule Patient has not taken in last 30 days    Follow-up: Return in about 6 months (around 11/12/2017).   [name], MD

## 2017-05-16 DIAGNOSIS — R6884 Jaw pain: Secondary | ICD-10-CM | POA: Insufficient documentation

## 2017-05-16 DIAGNOSIS — N529 Male erectile dysfunction, unspecified: Secondary | ICD-10-CM | POA: Insufficient documentation

## 2017-05-16 NOTE — Assessment & Plan Note (Signed)
Managed with Crestor for CAD noted during workup for AVR

## 2017-05-16 NOTE — Assessment & Plan Note (Signed)
he reports compliance with medication regimen  but has an elevated reading today in office.  He has been asked to check his BP at work and  submit readings for evaluation. Renal function will be checked

## 2017-05-16 NOTE — Assessment & Plan Note (Signed)
I have congratulated him in reduction of   BMI and encouraged  Continued weight loss with goal of 10% of body weigh over the next 6 months using a low glycemic index diet and regular exercise a minimum of 5 days per week.   

## 2017-05-16 NOTE — Assessment & Plan Note (Signed)
Right side,  Since May when he was treated for bacterial sinusitis. All other symptoms of sinusitis have resolved,  Recommend saline flushes as a trial , if persistent,  cT sinuses needed

## 2017-05-16 NOTE — Assessment & Plan Note (Signed)

## 2017-05-16 NOTE — Assessment & Plan Note (Signed)
May be medication side effect of metoprolol.  He has requested testosterone level be checked.

## 2017-05-18 ENCOUNTER — Other Ambulatory Visit: Payer: Self-pay | Admitting: Internal Medicine

## 2017-05-23 ENCOUNTER — Other Ambulatory Visit: Payer: Self-pay | Admitting: Internal Medicine

## 2017-05-27 ENCOUNTER — Ambulatory Visit (INDEPENDENT_AMBULATORY_CARE_PROVIDER_SITE_OTHER): Payer: 59 | Admitting: Pharmacist Clinician (PhC)/ Clinical Pharmacy Specialist

## 2017-05-27 DIAGNOSIS — Z954 Presence of other heart-valve replacement: Secondary | ICD-10-CM

## 2017-05-28 ENCOUNTER — Encounter: Payer: Self-pay | Admitting: *Deleted

## 2017-06-03 LAB — COMPREHENSIVE METABOLIC PANEL
ALBUMIN: 4.8 g/dL (ref 3.5–5.5)
ALT: 27 IU/L (ref 0–44)
AST: 27 IU/L (ref 0–40)
Albumin/Globulin Ratio: 1.9 (ref 1.2–2.2)
Alkaline Phosphatase: 58 IU/L (ref 39–117)
BUN/Creatinine Ratio: 10 (ref 9–20)
BUN: 13 mg/dL (ref 6–24)
Bilirubin Total: 0.5 mg/dL (ref 0.0–1.2)
CO2: 24 mmol/L (ref 20–29)
Calcium: 9.8 mg/dL (ref 8.7–10.2)
Chloride: 100 mmol/L (ref 96–106)
Creatinine, Ser: 1.25 mg/dL (ref 0.76–1.27)
GFR calc Af Amer: 76 mL/min/{1.73_m2} (ref >59)
GFR calc non Af Amer: 66 mL/min/{1.73_m2} (ref >59)
GLOBULIN, TOTAL: 2.5 g/dL (ref 1.5–4.5)
Glucose: 95 mg/dL (ref 65–99)
Potassium: 4.4 mmol/L (ref 3.5–5.2)
SODIUM: 139 mmol/L (ref 134–144)
Total Protein: 7.3 g/dL (ref 6.0–8.5)

## 2017-06-03 LAB — IRON AND TIBC
Iron Saturation: 30 % (ref 15–55)
Iron: 116 ug/dL (ref 38–169)
Total Iron Binding Capacity: 393 ug/dL (ref 250–450)
UIBC: 277 ug/dL (ref 111–343)

## 2017-06-03 LAB — LIPID PANEL
CHOL/HDL RATIO: 3.5 ratio (ref 0.0–5.0)
CHOLESTEROL TOTAL: 149 mg/dL (ref 100–199)
HDL: 42 mg/dL (ref >39)
LDL CALC: 81 mg/dL (ref 0–99)
TRIGLYCERIDES: 130 mg/dL (ref 0–149)
VLDL Cholesterol Cal: 26 mg/dL (ref 5–40)

## 2017-06-03 LAB — CBC WITH DIFFERENTIAL/PLATELET
BASOS ABS: 0 10*3/uL (ref 0.0–0.2)
Basos: 0 %
EOS (ABSOLUTE): 0.2 10*3/uL (ref 0.0–0.4)
EOS: 2 %
HEMATOCRIT: 42.8 % (ref 37.5–51.0)
HEMOGLOBIN: 14.9 g/dL (ref 13.0–17.7)
Immature Grans (Abs): 0 10*3/uL (ref 0.0–0.1)
Immature Granulocytes: 0 %
LYMPHS ABS: 2 10*3/uL (ref 0.7–3.1)
LYMPHS: 30 %
MCH: 30.5 pg (ref 26.6–33.0)
MCHC: 34.8 g/dL (ref 31.5–35.7)
MCV: 88 fL (ref 79–97)
MONOCYTES: 12 %
Monocytes Absolute: 0.8 10*3/uL (ref 0.1–0.9)
NEUTROS ABS: 3.8 10*3/uL (ref 1.4–7.0)
Neutrophils: 56 %
Platelets: 241 10*3/uL (ref 150–379)
RBC: 4.89 x10E6/uL (ref 4.14–5.80)
RDW: 13.7 % (ref 12.3–15.4)
WBC: 6.8 10*3/uL (ref 3.4–10.8)

## 2017-06-03 LAB — VITAMIN D 25 HYDROXY (VIT D DEFICIENCY, FRACTURES): VIT D 25 HYDROXY: 33.5 ng/mL (ref 30.0–100.0)

## 2017-06-03 LAB — TSH: TSH: 5.75 u[IU]/mL — ABNORMAL HIGH (ref 0.450–4.500)

## 2017-06-03 LAB — TESTOSTERONE, FREE, TOTAL, SHBG
Sex Hormone Binding: 40.8 nmol/L (ref 19.3–76.4)
Testosterone, Free: 10.3 pg/mL (ref 7.2–24.0)
Testosterone: 380 ng/dL (ref 264–916)

## 2017-06-03 LAB — FERRITIN: FERRITIN: 114 ng/mL (ref 30–400)

## 2017-06-03 LAB — VITAMIN B12: VITAMIN B 12: 369 pg/mL (ref 232–1245)

## 2017-06-09 ENCOUNTER — Ambulatory Visit: Payer: Self-pay | Admitting: General Surgery

## 2017-06-11 ENCOUNTER — Encounter: Payer: Self-pay | Admitting: General Surgery

## 2017-06-11 ENCOUNTER — Ambulatory Visit (INDEPENDENT_AMBULATORY_CARE_PROVIDER_SITE_OTHER): Payer: 59 | Admitting: General Surgery

## 2017-06-11 VITALS — BP 138/70 | HR 72 | Resp 12 | Ht 70.0 in | Wt 167.0 lb

## 2017-06-11 DIAGNOSIS — K625 Hemorrhage of anus and rectum: Secondary | ICD-10-CM | POA: Diagnosis not present

## 2017-06-11 LAB — POC HEMOCCULT BLD/STL (OFFICE/1-CARD/DIAGNOSTIC): Fecal Occult Blood, POC: NEGATIVE

## 2017-06-11 NOTE — Patient Instructions (Signed)
Patient to try suppository two times daily .  The patient is aware to call back for any questions or concerns.

## 2017-06-11 NOTE — Progress Notes (Signed)
Patient ID: Frank [name], male   DOB: 12/05/1965, 51 y.o.   MRN: [id number]  Chief Complaint  Patient presents with  . Rectal Bleeding    HPI Frank [name] is a 51 y.o. male here today for rectal bleeding. He states he has been having bleeding over a year now, more in the morning after his daily bowel movement.He states there is bright red blood when he wipes. No blood in the stools.  Occasionally notes a small amount of blood after long periods of activity (walking 4-5 miles). He has tried creams and it has not helped much.   Last colonoscopy 2010. Moves his bowels daily.  No family history of colon cancer or polyps.  HPI  Past Medical History:  Diagnosis Date  . Congenital heart valve abnormality    bicuspid aortic valve  . Dysrhythmia    PVC's  . GERD (gastroesophageal reflux disease)   . Hypercholesterolemia   . PONV (postoperative nausea and vomiting)    after a tonsillectomy..none since    Past Surgical History:  Procedure Laterality Date  . ANTERIOR CRUCIATE LIGAMENT REPAIR Right 08/01/2014   Procedure: RECONSTRUCTION ANTERIOR CRUCIATE LIGAMENT (ACL) WITH HAMSTRING GRAFT, MENISCAL DEBRIDEMENT AS NEEDED.;  Surgeon: Meredith Pel, MD;  Location: [facility];  Service: Orthopedics;  Laterality: Right;  . BENTALL PROCEDURE N/A 11/27/2014   Procedure: BENTALL PROCEDURE;  Surgeon: [name], MD;  Location: [facility];  Service: Open Heart Surgery;  Laterality: N/A;  WITH CIRC ARREST  . CARDIAC CATHETERIZATION     10/26/2014  . COLONOSCOPY  2010  . CORONARY ANGIOGRAM  10/26/2014   Procedure: CORONARY ANGIOGRAM;  Surgeon: [name], MD;  Location: [facility] CATH LAB;  Service: Cardiovascular;;  . INGUINAL HERNIA REPAIR  2006   left, Dr. Bary Castilla  . TEE WITHOUT CARDIOVERSION N/A 08/01/2014   Procedure: TRANSESOPHAGEAL ECHOCARDIOGRAM (TEE);  Surgeon: Meredith Pel, MD;  Location: [facility];  Service: Orthopedics;  Laterality: N/A;  . TEE WITHOUT CARDIOVERSION N/A 11/27/2014    Procedure: TRANSESOPHAGEAL ECHOCARDIOGRAM (TEE);  Surgeon: [name], MD;  Location: [facility];  Service: Open Heart Surgery;  Laterality: N/A;  . TONSILLECTOMY  2001    Family History  Problem Relation Age of Onset  . Arthritis Mother        psoriatis, Crohn's   . Heart disease Father        CAD in his early 76s    Social History Social History  Substance Use Topics  . Smoking status: Never Smoker  . Smokeless tobacco: Never Used  . Alcohol use 4.2 oz/week    7 Glasses of wine per week     Comment: occasional, drink 1 beer per wk    No Known Allergies  Current Outpatient Prescriptions  Medication Sig Dispense Refill  . ALPRAZolam (XANAX) 0.5 MG tablet TAKE 1 TABLET BY MOUTH AT BEDTIME IF NEEDED 30 tablet 1  . Ascorbic Acid (VITAMIN C) 1000 MG tablet Take 1,000 mg by mouth daily.    Marland Kitchen losartan (COZAAR) 50 MG tablet TAKE 1 TABLET BY MOUTH  DAILY 90 tablet 3  . metoprolol tartrate (LOPRESSOR) 25 MG tablet TAKE 1 TABLET BY MOUTH TWO  TIMES DAILY 180 tablet 3  . rosuvastatin (CRESTOR) 20 MG tablet TAKE 2 TABLETS BY MOUTH  DAILY 180 tablet 2  . warfarin (COUMADIN) 5 MG tablet TAKE 1/2 TO 1 TABLET BY  MOUTH DAILY AS DIRECTED BY  COUMADIN CLINIC 90 tablet 1   No current facility-administered  medications for this visit.     Review of Systems Review of Systems  Constitutional: Negative.   Respiratory: Negative.   Cardiovascular: Negative.   Gastrointestinal: Positive for anal bleeding.    Blood pressure 138/70, pulse 72, resp. rate 12, height 5\' 10"  (1.778 m), weight 167 lb (75.8 kg).  Physical Exam Physical Exam  Constitutional: He is oriented to person, place, and time. He appears well-developed and well-nourished.  Cardiovascular: Normal rate and regular rhythm.   Murmur heard.  Systolic murmur is present with a grade of 2/6  Normal valve click.   Pulmonary/Chest: Effort normal and breath sounds normal.  Genitourinary: Rectal exam shows no external hemorrhoid, no  internal hemorrhoid, no fissure, no mass, no tenderness, anal tone normal and guaiac negative stool.  Neurological: He is alert and oriented to person, place, and time.  Skin: Skin is warm and dry.    Data Reviewed Anoscopy failed to show any bleeding source.  Assessment    Rectal bleeding, anorectal source by history.     Plan    Patient has been less active for the last week secondary to travel.  When he develops recurrent bleeding, to try OTC suppositories BID.   Cologuard or [facility] equivalent for colon cancer screening    My chart note to report progress..   The patient is aware to call back for any questions or concerns.   HPI, Physical Exam, Assessment and Plan have been scribed under the direction and in the presence of Hervey Ard, MD.  Gaspar Cola, CMA  I have completed the exam and reviewed the above documentation for accuracy and completeness.  I agree with the above.  Haematologist has been used and any errors in dictation or transcription are unintentional.  Hervey Ard, M.D., F.A.C.S.  Robert Bellow 06/11/2017, 7:38 PM

## 2017-07-13 ENCOUNTER — Other Ambulatory Visit: Payer: Self-pay | Admitting: Internal Medicine

## 2017-07-14 LAB — PROTIME-INR
INR: 3 — ABNORMAL HIGH (ref 0.8–1.2)
PROTHROMBIN TIME: 29.2 s — AB (ref 9.1–12.0)

## 2017-07-19 ENCOUNTER — Other Ambulatory Visit: Payer: Self-pay | Admitting: Internal Medicine

## 2017-07-20 NOTE — Telephone Encounter (Signed)
Refilled: 05/18/2017 Last OV: 05/15/2017 Next OV: 11/12/2017

## 2017-07-21 NOTE — Telephone Encounter (Signed)
Printed, signed and faxed.  

## 2017-08-25 LAB — PROTIME-INR: INR: 2.1 — AB (ref 0.9–1.1)

## 2017-08-26 ENCOUNTER — Ambulatory Visit (INDEPENDENT_AMBULATORY_CARE_PROVIDER_SITE_OTHER): Payer: Managed Care, Other (non HMO) | Admitting: Pharmacist Clinician (PhC)/ Clinical Pharmacy Specialist

## 2017-08-26 DIAGNOSIS — Z954 Presence of other heart-valve replacement: Secondary | ICD-10-CM

## 2017-08-26 DIAGNOSIS — Z7901 Long term (current) use of anticoagulants: Secondary | ICD-10-CM | POA: Diagnosis not present

## 2017-08-28 ENCOUNTER — Ambulatory Visit: Payer: Managed Care, Other (non HMO) | Admitting: Internal Medicine

## 2017-08-28 ENCOUNTER — Encounter: Payer: Self-pay | Admitting: Internal Medicine

## 2017-08-28 VITALS — BP 142/88 | HR 64 | Resp 16 | Ht 70.0 in | Wt 189.2 lb

## 2017-08-28 DIAGNOSIS — E785 Hyperlipidemia, unspecified: Secondary | ICD-10-CM | POA: Diagnosis not present

## 2017-08-28 DIAGNOSIS — Z7901 Long term (current) use of anticoagulants: Secondary | ICD-10-CM | POA: Diagnosis not present

## 2017-08-28 DIAGNOSIS — Z954 Presence of other heart-valve replacement: Secondary | ICD-10-CM

## 2017-08-28 DIAGNOSIS — I2510 Atherosclerotic heart disease of native coronary artery without angina pectoris: Secondary | ICD-10-CM

## 2017-08-28 NOTE — Patient Instructions (Signed)
Your physician wants you to follow-up in: ONE YEAR with Dr. Hilty. You will receive a reminder letter in the mail two months in advance. If you don't receive a letter, please call our office to schedule the follow-up appointment.  

## 2017-08-28 NOTE — Progress Notes (Signed)
OFFICE NOTE  Chief Complaint:  Routine follow-up  Primary Care Physician: [name], MD  HPI:  Frank [name] is a 52 y.o. male with a history of at least moderate AS (followed by Dr. [name] at the [facility]), presented for ACL repair with Dr. [name]. Preoperatively, he was evaluated by Dr. [name] (cardiac anesthesia) and found to have a loud AS murmur- After some discussion, interoperative TEE was recommended and performed. I personally reviewed the TEE images which do demonstrate severe aortic stenosis, moderate AI and mild to moderate MR (obtained during surgery, on pressor, PPV, etc, but still significant). He was recently told he probably had another 5 years before needing to consider surgery. Mean gradient is already above 40 mmHg. Clinically, he has been asymptomatic (no chest pain, dyspnea, syncope, etc,) but recently he has had more palpitations (PVC's) and was started on b-blocker. He did manage to get through surgery uneventfully.   He returns today in follow-up to the office. He continues to deny any chest pain, worsening shortness of breath, presyncope or syncopal episodes. He has not pushed himself significantly due to his recent knee surgery. He is undergoing rehabilitation and getting stronger. A repeat echocardiogram was just performed as an outpatient. This was initially read by one of my partners and indicated mild to moderate aortic stenosis based on a mean valve gradient in the 20s. However subsequent images during the study do indicate mean gradients greater than 50 from the right upper sternal border using Pedoff probe. I therefore personally reviewed the echocardiogram images and made the appropriate corrections, and the results are as follows:  Study Conclusions  - Left ventricle: The cavity size was normal. Wall thickness was increased in a pattern of mild LVH. Systolic function was normal. The estimated ejection fraction was in the range of 55%  to 60%. - Aortic valve: The aortic valve is bicuspid and is heavily calcified with restricted leaflet motion. Peak and mean gradients through the valve are 100 and 59 mm Hg respectively, consistent with severe AS. The calculated AVA is 0.6 cm2. There is moderate, eccentric AI. There was mild regurgitation. - Aorta: Aortic root dimension: 39 mm (ED). - Aortic root: The aortic root is mildly dilated. - Mitral valve: Calcified annulus. Mildly thickened leaflets . There was mild regurgitation. - Left atrium: LA Volume/BSA= 46.4 ml/m2. The atrium is moderately dilated.  Impressions:  - LVEF 60-65%, moderate concentric LVH, bicuspid and severely stenotic aortic valve- AVA 0.6 cm2, peak and mean gradients of 100 mmHg and 59 mmHg, respectively - LVOT diameter measured at 2.4 cm. There is moderate AI. There is mild to moderate MR with restricted anterior leaflet motion secondary to eccentric AI.   Pedoff measurements do indeed show a very high mean gradient of 59 and peak gradient of 100 mmHg. Based on a generous LVOT diameter of 2.4 cm, the calculated aortic valve gradient was 0.6 cm consistent with severe aortic stenosis.  I saw Frank [name] back in the office today. He was recently on a trip and noticed increasing shortness of breath and easy fatigue with exercise which was a new finding for him. He feels that this is symptomatic aortic stenosis and I would agree. I discussed the case further with Dr. Cyndia Bent, the cardiac thoracic surgeon that he's been seeing, and we feel that it's probably the time now to entertain valve replacement. Based on that there is also concern about aortopathy given his bicuspid valve. He underwent a CT scan of  the aorta and coronaries which showed does reveal multivessel coronary artery calcification as well as calcium deposits in the left main coronary. The aortic valve is heavily calcified and restricted in motion. There was at least mild cardiac  hypertrophy and dilatation of the left ventricle. The ascending aorta is dilated measuring 4.3-4.4 cm. There is no evidence for coarctation or enlargement of the thoracic or descending aorta.  Frank [name] returns today for follow-up. He successfully underwent Bentall procedure with hemi-arch replacement using a 28 mm Hemashield graft under deep hypothermic circulatory arrest and Bentall Procedure ( replacement of aortic valve and aortic root with reimplantation of the coronary arteries) using a 21 mm St. Jude mechanical valve graft. Overall he is doing exceedingly well. He continues to do exercise. He is also recovering from recent knee surgery. He denies any chest pain or worsening shortness of breath. His energy levels improved significantly. His median sternotomy is healing well. He did have some postoperative anemia however this seems to have improved and his energy level is better.  I saw Carsen back in the office today for follow-up. He is doing exceedingly well. He's been active and working and traveling without any restrictions. He denies any chest pain or shortness of breath. He's had a small amount of weight gain however BMI is only 26. He feels better than he has in years he says. Recently we increased his cholesterol medication as his LDL was still not treated to goal. Particle number was just over 1100. He is due for recheck of this next month. He says he does get some mild muscle soreness after exercise that does not resolve quickly which he may attribute to increase in Crestor.  01/16/2016  Frank [name] returns today for follow-up. He reports that he is doing exceedingly well. His last use been excellent for him. He is exercising more regularly. He's had a small amount of weight gain but is less than 5 pounds and is starting to come down. He denies any chest pain or shortness of breath. His blood pressure is slightly higher recently however and he was started on losartan 50 mg daily.  Initial blood pressure today was 130/90 however recheck was 110/80 in the left arm and 112/82 in the right arm. He was concerned about some differential blood pressures in the past however this difference is negligible. In discussion today we realized he had not had an echocardiogram since his valve surgery and we need to establish a baseline valve gradient.  08/22/2016  Frank [name] returns today for follow-up. He says is feeling exceedingly well. He exercises regularly now and has a better energy level. He denies any shortness of breath or chest pain. He's been compliant with his INR checks and is Coumadin levels have been appropriate. Blood pressure appears well controlled. He has excellent cholesterol control with LDL in the 80s on rosuvastatin.   08/28/2017  Frank [name] is doing well and is without complaints. He is pleased with his AVR. He exercises regularly and has gained some muscle mass. He has maintained a therapeutic INR. He had an echo this past Summer which demonstrated a stable aortic valve gradient and normal LV function. BP is well controlled. Recent labs show LDL of 81 on Crestor.  PMHx:  Past Medical History:  Diagnosis Date  . Congenital heart valve abnormality    bicuspid aortic valve  . Dysrhythmia    PVC's  . GERD (gastroesophageal reflux disease)   . Hypercholesterolemia   . PONV (postoperative nausea and  vomiting)    after a tonsillectomy..none since    Past Surgical History:  Procedure Laterality Date  . ANTERIOR CRUCIATE LIGAMENT REPAIR Right 08/01/2014   Procedure: RECONSTRUCTION ANTERIOR CRUCIATE LIGAMENT (ACL) WITH HAMSTRING GRAFT, MENISCAL DEBRIDEMENT AS NEEDED.;  Surgeon: Meredith Pel, MD;  Location: [place];  Service: Orthopedics;  Laterality: Right;  . BENTALL PROCEDURE N/A 11/27/2014   Procedure: BENTALL PROCEDURE;  Surgeon: [name], MD;  Location: [place];  Service: Open Heart Surgery;  Laterality: N/A;  WITH CIRC ARREST  . CARDIAC  CATHETERIZATION     10/26/2014  . COLONOSCOPY  2010  . CORONARY ANGIOGRAM  10/26/2014   Procedure: CORONARY ANGIOGRAM;  Surgeon: [name], MD;  Location: [facility] CATH LAB;  Service: Cardiovascular;;  . INGUINAL HERNIA REPAIR  2006   left, Dr. Bary Castilla  . TEE WITHOUT CARDIOVERSION N/A 08/01/2014   Procedure: TRANSESOPHAGEAL ECHOCARDIOGRAM (TEE);  Surgeon: Meredith Pel, MD;  Location: [place];  Service: Orthopedics;  Laterality: N/A;  . TEE WITHOUT CARDIOVERSION N/A 11/27/2014   Procedure: TRANSESOPHAGEAL ECHOCARDIOGRAM (TEE);  Surgeon: [name], MD;  Location: [employer];  Service: Open Heart Surgery;  Laterality: N/A;  . TONSILLECTOMY  2001    FAMHx:  Family History  Problem Relation Age of Onset  . Arthritis Mother        psoriatis, Crohn's   . Heart disease Father        CAD in his early 66s    SOCHx:   reports that  has never smoked. he has never used smokeless tobacco. He reports that he drinks about 4.2 oz of alcohol per week. He reports that he does not use drugs.  ALLERGIES:  No Known Allergies  ROS: Pertinent items noted in HPI and remainder of comprehensive ROS otherwise negative.  HOME MEDS: Current Outpatient Medications on File Prior to Visit  Medication Sig Dispense Refill  . ALPRAZolam (XANAX) 0.5 MG tablet TAKE 1 TABLET BY MOUTH AT BEDTIME IF NEEDED 30 tablet 1  . Ascorbic Acid (VITAMIN C) 1000 MG tablet Take 1,000 mg by mouth daily.    Marland Kitchen losartan (COZAAR) 50 MG tablet TAKE 1 TABLET BY MOUTH  DAILY 90 tablet 3  . metoprolol tartrate (LOPRESSOR) 25 MG tablet TAKE 1 TABLET BY MOUTH TWO  TIMES DAILY 180 tablet 3  . rosuvastatin (CRESTOR) 20 MG tablet TAKE 2 TABLETS BY MOUTH  DAILY 180 tablet 2  . warfarin (COUMADIN) 5 MG tablet TAKE 1/2 TO 1 TABLET BY  MOUTH DAILY AS DIRECTED BY  COUMADIN CLINIC 90 tablet 1   No current facility-administered medications on file prior to visit.     LABS/IMAGING: No results found for this or any previous visit (from the past  48 hour(s)). No results found.  WEIGHTS: Wt Readings from Last 3 Encounters:  08/28/17 189 lb 3.2 oz (85.8 kg)  06/11/17 167 lb (75.8 kg)  05/15/17 182 lb 12.8 oz (82.9 kg)    VITALS: BP (!) 142/88   Pulse 64   Resp 16   Ht 5\' 10"  (1.778 m)   Wt 189 lb 3.2 oz (85.8 kg)   SpO2 100%   BMI 27.15 kg/m   EXAM: General appearance: alert and no distress Neck: no carotid bruit and no JVD Lungs: clear to auscultation bilaterally Heart: regular rate and rhythm, S1, S2 normal and Sharp mechanical valve sounds Abdomen: soft, non-tender; bowel sounds normal; no masses,  no organomegaly Extremities: extremities normal, atraumatic, no cyanosis or edema Pulses: 2+ and  symmetric Skin: Skin color, texture, turgor normal. No rashes or lesions Neurologic: Grossly normal I: Pleasant  EKG: Sinus bradycardia at 55, RBBB- personally reviewed  ASSESSMENT: 1. Status post aVR with the mechanical valve 2. Bentall hemi-arch repair for ascending aortic aneurysm 3. Mild nonobstructive coronary disease 4. Dyslipidemia 5. History of bicuspid aortic valve with aortopathy  PLAN: 1.   Frank [name] is doing well without any worsening shortness of breath or chest pain. His INR has been stable. His cholesterol is well controlled. He had an echo 6 months ago which showed a normally functioning mechanical aortic valve. Plan to continue current medications and I'll see him back in follow-up annually or sooner as necessary.  [name], MD, [facility], [place] Director of the [facility] Lipid [facility] &  Cardiovascular [facility] Reduction [facility] Diplomate of the American Board of Clinical Lipidology Attending Cardiologist  Direct Dial: [phone]  Fax: [phone]  Website:  [email or web address] Kiyona Mcnall 08/28/2017, 8:05 AM

## 2017-08-29 ENCOUNTER — Encounter: Payer: Self-pay | Admitting: Internal Medicine

## 2017-08-29 DIAGNOSIS — Z7901 Long term (current) use of anticoagulants: Secondary | ICD-10-CM | POA: Insufficient documentation

## 2017-09-28 ENCOUNTER — Other Ambulatory Visit: Payer: Self-pay | Admitting: Internal Medicine

## 2017-09-29 NOTE — Telephone Encounter (Signed)
Refilled: 07/20/2017 Last OV: 05/15/2017 Next OV: 11/12/2017

## 2017-09-30 NOTE — Telephone Encounter (Signed)
Printed, signed and faxed.  

## 2017-10-02 ENCOUNTER — Other Ambulatory Visit: Payer: Self-pay | Admitting: Internal Medicine

## 2017-10-02 NOTE — Telephone Encounter (Signed)
REFILL 

## 2017-10-10 ENCOUNTER — Other Ambulatory Visit: Payer: Self-pay | Admitting: Internal Medicine

## 2017-10-26 LAB — PROTIME-INR: INR: 2.3 — AB (ref 0.9–1.1)

## 2017-10-27 ENCOUNTER — Ambulatory Visit (INDEPENDENT_AMBULATORY_CARE_PROVIDER_SITE_OTHER): Payer: Managed Care, Other (non HMO) | Admitting: Pharmacist

## 2017-10-27 DIAGNOSIS — Z954 Presence of other heart-valve replacement: Secondary | ICD-10-CM | POA: Diagnosis not present

## 2017-10-28 ENCOUNTER — Other Ambulatory Visit: Payer: Self-pay | Admitting: Internal Medicine

## 2017-11-09 ENCOUNTER — Encounter: Payer: Self-pay | Admitting: Internal Medicine

## 2017-11-09 DIAGNOSIS — R7989 Other specified abnormal findings of blood chemistry: Secondary | ICD-10-CM

## 2017-11-11 ENCOUNTER — Other Ambulatory Visit: Payer: Self-pay | Admitting: Internal Medicine

## 2017-11-12 ENCOUNTER — Ambulatory Visit: Payer: 59 | Admitting: Internal Medicine

## 2017-11-12 ENCOUNTER — Encounter: Payer: Self-pay | Admitting: Internal Medicine

## 2017-11-12 VITALS — BP 132/90 | HR 68 | Temp 97.3°F | Resp 15 | Ht 70.0 in | Wt 187.4 lb

## 2017-11-12 DIAGNOSIS — E785 Hyperlipidemia, unspecified: Secondary | ICD-10-CM

## 2017-11-12 DIAGNOSIS — E039 Hypothyroidism, unspecified: Secondary | ICD-10-CM | POA: Diagnosis not present

## 2017-11-12 DIAGNOSIS — Z5181 Encounter for therapeutic drug level monitoring: Secondary | ICD-10-CM

## 2017-11-12 DIAGNOSIS — I10 Essential (primary) hypertension: Secondary | ICD-10-CM

## 2017-11-12 LAB — TSH: TSH: 6.62 u[IU]/mL — ABNORMAL HIGH (ref 0.450–4.500)

## 2017-11-12 MED ORDER — LOSARTAN POTASSIUM-HCTZ 50-12.5 MG PO TABS: 1.0000 | ORAL_TABLET | Freq: Every day | ORAL | 3 refills | Status: DC

## 2017-11-12 MED ORDER — LEVOTHYROXINE SODIUM 25 MCG PO TABS: 25.0000 ug | ORAL_TABLET | Freq: Every day | ORAL | 0 refills | Status: DC

## 2017-11-12 NOTE — Progress Notes (Signed)
Subjective:  Patient ID: Frank [name], male    DOB: 03-Oct-1965  Age: 52 y.o. MRN: [id number]  CC: The primary encounter diagnosis was Encounter for therapeutic drug monitoring. Diagnoses of Acquired hypothyroidism, Essential hypertension, and Dyslipidemia were also pertinent to this visit.  HPI [name] presents for follow up on hypertension  Hypertension: patient checks blood pressure  daily at 7:30 am post exercise pre food and post medications. .  Taking losartan in the evening and metoprorol bid  .  Readings have been for the most part > 130/70 at rest . Patient is following a reduced salt diet  and is taking medications as prescribed.  He has been exercising regularly and watching his diet but is unable to lose the desired weight  He  had an elevated TSH 5 months ago  and recent repeat was 6.6 .  He requests a trial of thyroid replacement.    +  Outpatient Medications Prior to Visit  Medication Sig Dispense Refill  . ALPRAZolam (XANAX) 0.5 MG tablet TAKE 1 TABLET BY MOUTH AT BEDTIME IF NEEDED 30 tablet 1  . Ascorbic Acid (VITAMIN C) 1000 MG tablet Take 1,000 mg by mouth daily.    Marland Kitchen losartan (COZAAR) 50 MG tablet TAKE 1 TABLET BY MOUTH  DAILY 90 tablet 1  . metoprolol tartrate (LOPRESSOR) 25 MG tablet TAKE 1 TABLET BY MOUTH TWO  TIMES DAILY 180 tablet 3  . rosuvastatin (CRESTOR) 20 MG tablet TAKE 2 TABLETS BY MOUTH  DAILY 180 tablet 7  . warfarin (COUMADIN) 5 MG tablet TAKE 1/2 TO 1 TABLET BY  MOUTH DAILY AS DIRECTED BY  COUMADIN CLINIC 90 tablet 1   No facility-administered medications prior to visit.     Review of Systems;  Patient denies headache, fevers, malaise, unintentional weight loss, skin rash, eye pain, sinus congestion and sinus pain, sore throat, dysphagia,  hemoptysis , cough, dyspnea, wheezing, chest pain, palpitations, orthopnea, edema, abdominal pain, nausea, melena, diarrhea, constipation, flank pain, dysuria, hematuria, urinary  Frequency, nocturia,  numbness, tingling, seizures,  Focal weakness, Loss of consciousness,  Tremor, insomnia, depression, anxiety, and suicidal ideation.      Objective:  BP 132/90 (BP Location: Left Arm, Patient Position: Sitting, Cuff Size: Normal)   Pulse 68   Temp (!) 97.3 F (36.3 C) (Oral)   Resp 15   Ht 5\' 10"  (1.778 m)   Wt 187 lb 6.4 oz (85 kg)   SpO2 93%   BMI 26.89 kg/m   BP Readings from Last 3 Encounters:  11/12/17 132/90  08/28/17 (!) 142/88  06/11/17 138/70    Wt Readings from Last 3 Encounters:  11/12/17 187 lb 6.4 oz (85 kg)  08/28/17 189 lb 3.2 oz (85.8 kg)  06/11/17 167 lb (75.8 kg)    General appearance: alert, cooperative and appears stated age Ears: normal TM's and external ear canals both ears Throat: lips, mucosa, and tongue normal; teeth and gums normal Neck: no adenopathy, no carotid bruit, supple, symmetrical, trachea midline and thyroid not enlarged, symmetric, no tenderness/mass/nodules Back: symmetric, no curvature. ROM normal. No CVA tenderness. Lungs: clear to auscultation bilaterally Heart: regular rate and rhythm, S1, S2 normal, no murmur, click, rub or gallop Abdomen: soft, non-tender; bowel sounds normal; no masses,  no organomegaly Pulses: 2+ and symmetric Skin: Skin color, texture, turgor normal. No rashes or lesions Lymph nodes: Cervical, supraclavicular, and axillary nodes normal.  Lab Results  Component Value Date   HGBA1C 5.8 (H) 11/23/2014  Lab Results  Component Value Date   CREATININE 1.25 06/02/2017   CREATININE 1.24 05/08/2016   CREATININE 1.29 (H) 11/27/2015    Lab Results  Component Value Date   WBC 6.8 06/02/2017   HGB 14.9 06/02/2017   HCT 42.8 06/02/2017   PLT 241 06/02/2017   GLUCOSE 95 06/02/2017   CHOL 149 06/02/2017   TRIG 130 06/02/2017   HDL 42 06/02/2017   LDLCALC 81 06/02/2017   ALT 27 06/02/2017   AST 27 06/02/2017   NA 139 06/02/2017   K 4.4 06/02/2017   CL 100 06/02/2017   CREATININE 1.25 06/02/2017   BUN  13 06/02/2017   CO2 24 06/02/2017   TSH 6.620 (H) 11/11/2017   PSA 0.4 04/28/2012   INR 2.3 (A) 10/26/2017   HGBA1C 5.8 (H) 11/23/2014    Dg Chest 2 View  Result Date: 01/03/2015 CLINICAL DATA:  Heart surgery. EXAM: CHEST  2 VIEW COMPARISON:  11/30/2014. FINDINGS: Prior aortic valve replacement. Cardiomegaly. No pulmonary venous congestion. No focal pulmonary infiltrate. No pleural effusion or pneumothorax. No acute bony abnormality . IMPRESSION: Aortic valve replacement. Mild cardiomegaly. No CHF. No acute pulmonary disease. Electronically Signed   By: Marcello Moores  Register   On: 01/03/2015 11:28    Assessment & Plan:   Problem List Items Addressed This Visit    Encounter for therapeutic drug monitoring - Primary   Relevant Orders   INR/PT   Dyslipidemia   Relevant Orders   Lipid panel   Essential hypertension    Not at goal,  Adding hctz to losartan and metoprolol.  Needs repeat bmet in one to 2 weeks       Relevant Medications   losartan-hydrochlorothiazide (HYZAAR) 50-12.5 MG tablet   Other Relevant Orders   Basic metabolic panel   Comprehensive metabolic panel   Acquired hypothyroidism    Starting levothyroxine for persistently elevated TSH and inability to lose weight despite diet and exercise,       Relevant Medications   levothyroxine (SYNTHROID, LEVOTHROID) 25 MCG tablet   Other Relevant Orders   TSH    A total of 25 minutes of face to face time was spent with patient more than half of which was spent in counselling about the above mentioned conditions  and coordination of care   I am having Stephannie Li Percle start on levothyroxine and losartan-hydrochlorothiazide. I am also having him maintain his vitamin C, ALPRAZolam, losartan, rosuvastatin, metoprolol tartrate, and warfarin.  Meds ordered this encounter  Medications  . levothyroxine (SYNTHROID, LEVOTHROID) 25 MCG tablet    Sig: Take 1 tablet (25 mcg total) by mouth daily before breakfast.    Dispense:  90  tablet    Refill:  0  . losartan-hydrochlorothiazide (HYZAAR) 50-12.5 MG tablet    Sig: Take 1 tablet by mouth daily.    Dispense:  90 tablet    Refill:  3    There are no discontinued medications.  Follow-up: No follow-ups on file.   [name], MD

## 2017-11-12 NOTE — Patient Instructions (Signed)
Starting levothyroxine at 25 mcg daily in the morning  changing losartan to losartan/hct 50/12.5  Daily in the morning   Get bmet checked along with INR one week after starting meds   Repeat TSH 6 weeks after starting levothyroxine  6 weeks  After dose change

## 2017-11-14 NOTE — Assessment & Plan Note (Signed)
Not at goal,  Adding hctz to losartan and metoprolol.  Needs repeat bmet in one to 2 weeks

## 2017-11-14 NOTE — Assessment & Plan Note (Signed)
Starting levothyroxine for persistently elevated TSH and inability to lose weight despite diet and exercise,

## 2017-11-23 LAB — PROTIME-INR: INR: 2.1 — AB (ref 0.9–1.1)

## 2017-11-25 ENCOUNTER — Ambulatory Visit (INDEPENDENT_AMBULATORY_CARE_PROVIDER_SITE_OTHER): Payer: Managed Care, Other (non HMO) | Admitting: Pharmacist

## 2017-11-25 ENCOUNTER — Telehealth: Payer: Self-pay | Admitting: Internal Medicine

## 2017-11-25 DIAGNOSIS — Z7901 Long term (current) use of anticoagulants: Secondary | ICD-10-CM | POA: Diagnosis not present

## 2017-11-25 DIAGNOSIS — Z954 Presence of other heart-valve replacement: Secondary | ICD-10-CM

## 2017-11-25 NOTE — Telephone Encounter (Signed)
His last INR was 2.1 on November 23 2017

## 2017-12-11 ENCOUNTER — Telehealth: Payer: Self-pay

## 2017-12-14 NOTE — Telephone Encounter (Signed)
Error

## 2017-12-16 ENCOUNTER — Other Ambulatory Visit: Payer: Self-pay

## 2017-12-16 MED ORDER — LEVOTHYROXINE SODIUM 25 MCG PO TABS: 25.0000 ug | ORAL_TABLET | Freq: Every day | ORAL | 0 refills | Status: DC

## 2018-01-01 ENCOUNTER — Other Ambulatory Visit: Payer: Self-pay | Admitting: Internal Medicine

## 2018-01-01 LAB — COMPREHENSIVE METABOLIC PANEL
ALBUMIN: 4.5 g/dL (ref 3.5–5.5)
ALT: 22 IU/L (ref 0–44)
AST: 24 IU/L (ref 0–40)
Albumin/Globulin Ratio: 1.7 (ref 1.2–2.2)
Alkaline Phosphatase: 61 IU/L (ref 39–117)
BUN / CREAT RATIO: 9 (ref 9–20)
BUN: 12 mg/dL (ref 6–24)
Bilirubin Total: 0.5 mg/dL (ref 0.0–1.2)
CALCIUM: 9.9 mg/dL (ref 8.7–10.2)
CO2: 22 mmol/L (ref 20–29)
CREATININE: 1.29 mg/dL — AB (ref 0.76–1.27)
Chloride: 101 mmol/L (ref 96–106)
GFR, EST AFRICAN AMERICAN: 73 mL/min/{1.73_m2} (ref >59)
GFR, EST NON AFRICAN AMERICAN: 63 mL/min/{1.73_m2} (ref >59)
GLOBULIN, TOTAL: 2.7 g/dL (ref 1.5–4.5)
Glucose: 93 mg/dL (ref 65–99)
Potassium: 4.7 mmol/L (ref 3.5–5.2)
SODIUM: 138 mmol/L (ref 134–144)
TOTAL PROTEIN: 7.2 g/dL (ref 6.0–8.5)

## 2018-01-01 LAB — LIPID PANEL
CHOL/HDL RATIO: 4.1 ratio (ref 0.0–5.0)
Cholesterol, Total: 147 mg/dL (ref 100–199)
HDL: 36 mg/dL — ABNORMAL LOW (ref >39)
LDL CALC: 70 mg/dL (ref 0–99)
TRIGLYCERIDES: 204 mg/dL — AB (ref 0–149)
VLDL Cholesterol Cal: 41 mg/dL — ABNORMAL HIGH (ref 5–40)

## 2018-01-01 LAB — TSH: TSH: 3.09 u[IU]/mL (ref 0.450–4.500)

## 2018-01-14 ENCOUNTER — Telehealth: Payer: Self-pay | Admitting: Internal Medicine

## 2018-01-14 NOTE — Telephone Encounter (Signed)
Please advise 

## 2018-01-14 NOTE — Telephone Encounter (Signed)
Copied from [place] [id number]. Topic: Quick Communication - Rx Refill/Question >> Jan 14, 2018 10:30 AM Robina Ade, Helene Kelp D wrote: Medication:ALPRAZolam Duanne Moron) 0.5 MG tablet for 90 day supply but pt would like 0.25 mg.  Has the patient contacted their pharmacy? Yes (Agent: If no, request that the patient contact the pharmacy for the refill.) (Agent: If yes, when and what did the pharmacy advise?)  Preferred Pharmacy (with phone number or street name): [facility], [place]  Agent: Please be advised that RX refills may take up to 3 business days. We ask that you follow-up with your pharmacy.

## 2018-01-15 MED ORDER — HYDROCHLOROTHIAZIDE 12.5 MG PO TABS: 12.5000 mg | ORAL_TABLET | Freq: Every day | ORAL | 1 refills | Status: DC

## 2018-01-15 MED ORDER — ALPRAZOLAM 0.5 MG PO TABS: ORAL_TABLET | ORAL | 5 refills | Status: DC

## 2018-01-15 NOTE — Telephone Encounter (Signed)
I am not sure if I understand message, but if he has rx already then can take 1/2 tablet.

## 2018-01-15 NOTE — Telephone Encounter (Signed)
Pt did not need a rx for the alprazolam.

## 2018-01-15 NOTE — Telephone Encounter (Signed)
Refilled: 09/29/2017 Last OV: 11/12/2017 Next OV: 05/17/2018

## 2018-01-15 NOTE — Telephone Encounter (Signed)
Ok   to refill the alprazolam by phone .  Will send in the rx for hctz.

## 2018-01-15 NOTE — Telephone Encounter (Signed)
Spoke with pt and he stated that he did not request this refill through [facility] nor did he request a dose change. While on the phone the pt stated that the last time he was in you changed his plain losartan to losartan/hctz. He stated that he has never started that because [facility] due to it being on recalled and on back order. The pt wanted to let you know that he has been taking just the losartan and the metoprolol as he was but is okay with taking a separate diuretic if you would like for him too.

## 2018-01-16 ENCOUNTER — Other Ambulatory Visit: Payer: Self-pay | Admitting: Internal Medicine

## 2018-01-19 NOTE — Telephone Encounter (Signed)
Pt [facility] not [facility]. Printed, signed and faxed.

## 2018-01-28 ENCOUNTER — Encounter: Payer: Self-pay | Admitting: Internal Medicine

## 2018-01-28 LAB — BASIC METABOLIC PANEL
BUN / CREAT RATIO: 13 (ref 9–20)
BUN: 17 mg/dL (ref 6–24)
CHLORIDE: 98 mmol/L (ref 96–106)
CO2: 25 mmol/L (ref 20–29)
Calcium: 9.8 mg/dL (ref 8.7–10.2)
Creatinine, Ser: 1.26 mg/dL (ref 0.76–1.27)
Creatinine: 1.2 (ref 0.6–1.3)
GFR calc non Af Amer: 65 mL/min/{1.73_m2} (ref >59)
GFR, EST AFRICAN AMERICAN: 75 mL/min/{1.73_m2} (ref >59)
GLUCOSE: 95 mg/dL (ref 65–99)
Potassium: 4.3 mmol/L (ref 3.5–5.2)
SODIUM: 136 mmol/L (ref 134–144)

## 2018-01-28 LAB — LIPID PANEL
Cholesterol: 145 (ref 0–200)
LDL CALC: 75

## 2018-01-28 LAB — PROTIME-INR
INR: 2.8 — ABNORMAL HIGH (ref 0.8–1.2)
PROTHROMBIN TIME: 27.6 s — AB (ref 9.1–12.0)

## 2018-02-24 ENCOUNTER — Other Ambulatory Visit: Payer: Self-pay | Admitting: Internal Medicine

## 2018-02-26 ENCOUNTER — Telehealth: Payer: Self-pay | Admitting: *Deleted

## 2018-02-26 MED ORDER — LOSARTAN POTASSIUM 50 MG PO TABS: 50.0000 mg | ORAL_TABLET | Freq: Every day | ORAL | 1 refills | Status: DC

## 2018-02-26 MED ORDER — HYDROCHLOROTHIAZIDE 12.5 MG PO TABS: 12.5000 mg | ORAL_TABLET | Freq: Every day | ORAL | 1 refills | Status: DC

## 2018-02-26 MED ORDER — LEVOTHYROXINE SODIUM 25 MCG PO TABS: ORAL_TABLET | ORAL | 0 refills | Status: DC

## 2018-02-26 NOTE — Telephone Encounter (Signed)
Patient verified he would like [facility] for medications below

## 2018-02-26 NOTE — Telephone Encounter (Signed)
Copied from [place] [phone]. Topic: Inquiry >> Feb 26, 2018 10:51 AM Pricilla Handler wrote: Reason for CRM: [place] with [facility] [phone]) called requesting to speak with someone in the office you can authorize patient's medication. Please call [place] with [facility] at [phone].       Thank You!!!  >> Feb 26, 2018 10:56 AM Pricilla Handler wrote:  hydrochlorothiazide (HYDRODIURIL) 12.5 MG tablet    levothyroxine (SYNTHROID, LEVOTHROID) 25 MCG tablet    losartan (COZAAR) 50 MG tablet

## 2018-03-19 ENCOUNTER — Other Ambulatory Visit: Payer: Self-pay | Admitting: Internal Medicine

## 2018-03-20 LAB — PROTIME-INR
INR: 2.5 — AB (ref 0.8–1.2)
PROTHROMBIN TIME: 24.3 s — AB (ref 9.1–12.0)

## 2018-04-11 ENCOUNTER — Other Ambulatory Visit: Payer: Self-pay | Admitting: Internal Medicine

## 2018-05-04 LAB — PROTIME-INR: INR: 2.4 — AB (ref 0.9–1.1)

## 2018-05-06 ENCOUNTER — Ambulatory Visit (INDEPENDENT_AMBULATORY_CARE_PROVIDER_SITE_OTHER): Payer: Managed Care, Other (non HMO) | Admitting: Pharmacist Clinician (PhC)/ Clinical Pharmacy Specialist

## 2018-05-06 DIAGNOSIS — Z954 Presence of other heart-valve replacement: Secondary | ICD-10-CM | POA: Diagnosis not present

## 2018-05-06 DIAGNOSIS — Z7901 Long term (current) use of anticoagulants: Secondary | ICD-10-CM | POA: Diagnosis not present

## 2018-05-17 ENCOUNTER — Encounter: Payer: Self-pay | Admitting: Internal Medicine

## 2018-05-17 ENCOUNTER — Ambulatory Visit (INDEPENDENT_AMBULATORY_CARE_PROVIDER_SITE_OTHER): Payer: Managed Care, Other (non HMO) | Admitting: Internal Medicine

## 2018-05-17 VITALS — BP 114/80 | HR 54 | Temp 97.9°F | Resp 14 | Ht 70.0 in | Wt 183.4 lb

## 2018-05-17 DIAGNOSIS — I10 Essential (primary) hypertension: Secondary | ICD-10-CM

## 2018-05-17 DIAGNOSIS — F5101 Primary insomnia: Secondary | ICD-10-CM

## 2018-05-17 DIAGNOSIS — Z125 Encounter for screening for malignant neoplasm of prostate: Secondary | ICD-10-CM | POA: Diagnosis not present

## 2018-05-17 DIAGNOSIS — E039 Hypothyroidism, unspecified: Secondary | ICD-10-CM | POA: Diagnosis not present

## 2018-05-17 DIAGNOSIS — Z0000 Encounter for general adult medical examination without abnormal findings: Secondary | ICD-10-CM

## 2018-05-17 DIAGNOSIS — Z23 Encounter for immunization: Secondary | ICD-10-CM | POA: Diagnosis not present

## 2018-05-17 MED ORDER — LEVOTHYROXINE SODIUM 50 MCG PO TABS: 50.0000 ug | ORAL_TABLET | Freq: Every day | ORAL | 1 refills | Status: DC

## 2018-05-17 NOTE — Assessment & Plan Note (Signed)
Managed with alprazolam for sleep initiation. he has been cautioned not to use this in combination with alcohol.

## 2018-05-17 NOTE — Patient Instructions (Addendum)
WE DISCUSSED INCREASING YOUR THYROID DOSE TO 50   MCG DAILY AND REPEATING YOUR TSH IN 6 WEEKS ,  WITH YOUR PSA AND CMET  INCREASE YOUR VIAMIN D INTAKE TO 2000 IUS DAILY FROM November THROUGH April   MMR GIVEN TODAY   BEST TASTING HIGH FIBER LOW CARB WHITE LOAF BREAD IS SOLA  HERE ARE MORE LOW CARB  BREAD CHOICES THAT ARE HIGH IN FIBER.  THE [place] HAS 23 GRAMS.         Health Maintenance, Male A healthy lifestyle and preventive care is important for your health and wellness. Ask your health care provider about what schedule of regular examinations is right for you. What should I know about weight and diet? Eat a Healthy Diet  Eat plenty of vegetables, fruits, whole grains, low-fat dairy products, and lean protein.  Do not eat a lot of foods high in solid fats, added sugars, or salt.  Maintain a Healthy Weight Regular exercise can help you achieve or maintain a healthy weight. You should:  Do at least 150 minutes of exercise each week. The exercise should increase your heart rate and make you sweat (moderate-intensity exercise).  Do strength-training exercises at least twice a week.  Watch Your Levels of Cholesterol and Blood Lipids  Have your blood tested for lipids and cholesterol every 5 years starting at 53 years of age. If you are at high risk for heart disease, you should start having your blood tested when you are 53 years old. You may need to have your cholesterol levels checked more often if: ? Your lipid or cholesterol levels are high. ? You are older than 53 years of age. ? You are at high risk for heart disease.  What should I know about cancer screening? Many types of cancers can be detected early and may often be prevented. Lung Cancer  You should be screened every year for lung cancer if: ? You are a current smoker who has smoked for at least 30 years. ? You are a former smoker who has quit within the past 15 years.  Talk to your health care  provider about your screening options, when you should start screening, and how often you should be screened.  Colorectal Cancer  Routine colorectal cancer screening usually begins at 53 years of age and should be repeated every 5-10 years until you are 53 years old. You may need to be screened more often if early forms of precancerous polyps or small growths are found. Your health care provider may recommend screening at an earlier age if you have risk factors for colon cancer.  Your health care provider may recommend using home test kits to check for hidden blood in the stool.  A small camera at the end of a tube can be used to examine your colon (sigmoidoscopy or colonoscopy). This checks for the earliest forms of colorectal cancer.  Prostate and Testicular Cancer  Depending on your age and overall health, your health care provider may do certain tests to screen for prostate and testicular cancer.  Talk to your health care provider about any symptoms or concerns you have about testicular or prostate cancer.  Skin Cancer  Check your skin from head to toe regularly.  Tell your health care provider about any new moles or changes in moles, especially if: ? There is a change in a mole's size, shape, or color. ? You have a mole that is larger than a pencil eraser.  Always use sunscreen.  Apply sunscreen liberally and repeat throughout the day.  Protect yourself by wearing long sleeves, pants, a wide-brimmed hat, and sunglasses when outside.  What should I know about heart disease, diabetes, and high blood pressure?  If you are 79-50 years of age, have your blood pressure checked every 3-5 years. If you are 33 years of age or older, have your blood pressure checked every year. You should have your blood pressure measured twice-once when you are at a hospital or clinic, and once when you are not at a hospital or clinic. Record the average of the two measurements. To check your blood pressure  when you are not at a hospital or clinic, you can use: ? An automated blood pressure machine at a pharmacy. ? A home blood pressure monitor.  Talk to your health care provider about your target blood pressure.  If you are between 3-56 years old, ask your health care provider if you should take aspirin to prevent heart disease.  Have regular diabetes screenings by checking your fasting blood sugar level. ? If you are at a normal weight and have a low risk for diabetes, have this test once every three years after the age of 71. ? If you are overweight and have a high risk for diabetes, consider being tested at a younger age or more often.  A one-time screening for abdominal aortic aneurysm (AAA) by ultrasound is recommended for men aged 57-75 years who are current or former smokers. What should I know about preventing infection? Hepatitis B If you have a higher risk for hepatitis B, you should be screened for this virus. Talk with your health care provider to find out if you are at risk for hepatitis B infection. Hepatitis C Blood testing is recommended for:  Everyone born from [birth year] through 1966.  Anyone with known risk factors for hepatitis C.  Sexually Transmitted Diseases (STDs)  You should be screened each year for STDs including gonorrhea and chlamydia if: ? You are sexually active and are younger than 53 years of age. ? You are older than 53 years of age and your health care provider tells you that you are at risk for this type of infection. ? Your sexual activity has changed since you were last screened and you are at an increased risk for chlamydia or gonorrhea. Ask your health care provider if you are at risk.  Talk with your health care provider about whether you are at high risk of being infected with HIV. Your health care provider may recommend a prescription medicine to help prevent HIV infection.  What else can I do?  Schedule regular health, dental, and eye  exams.  Stay current with your vaccines (immunizations).  Do not use any tobacco products, such as cigarettes, chewing tobacco, and e-cigarettes. If you need help quitting, ask your health care provider.  Limit alcohol intake to no more than 2 drinks per day. One drink equals 12 ounces of beer, 5 ounces of wine, or 1 ounces of hard liquor.  Do not use street drugs.  Do not share needles.  Ask your health care provider for help if you need support or information about quitting drugs.  Tell your health care provider if you often feel depressed.  Tell your health care provider if you have ever been abused or do not feel safe at home. This information is not intended to replace advice given to you by your health care provider. Make sure you discuss any questions you have  with your health care provider. Document Released: 01/31/2008 Document Revised: 04/02/2016 Document Reviewed: 05/08/2015 Elsevier Interactive Patient Education  [employer].

## 2018-05-17 NOTE — Progress Notes (Signed)
Patient ID: Frank [name], male    DOB: 1966/05/12  Age: 52 y.o. MRN: [id number]  The patient is here for annual PREVENTIVE examination and management of other chronic and acute   problems.   LABS DONE AT [facility]. NEEDS MUMPS REVACCINATED   Lab Results  Component Value Date   TSH 3.090 12/31/2017    The risk factors are reflected in the social history.  The roster of all physicians providing medical care to patient - is listed in the Snapshot section of the chart.  Activities of daily living:  The patient is 100% independent in all ADLs: dressing, toileting, feeding as well as independent mobility  Home safety : The patient has smoke detectors in the home. They wear seatbelts.  There are no firearms at home. There is no violence in the home.   There is no risks for hepatitis, STDs or HIV. There is no   history of blood transfusion. They have no travel history to infectious disease endemic areas of the world.  The patient has seen their dentist in the last six month. They have seen their eye doctor in the last year. nO HEARING ISSUES.They do not  have excessive sun exposure. Discussed the need for sun protection: hats, long sleeves and use of sunscreen if there is significant sun exposure.   Diet: the importance of a healthy diet is discussed. They do have a healthy diet.  The benefits of regular aerobic exercise were discussed. She walks 4 times per week ,  20 minutes.   Depression screen: there are no signs or vegative symptoms of depression- irritability, change in appetite, anhedonia, sadness/tearfullness.  Cognitive assessment: the patient manages all their financial and personal affairs and is actively engaged. They could relate day,date,year and events; recalled 2/3 objects at 3 minutes; performed clock-face test normally.  The following portions of the patient's history were reviewed and updated as appropriate: allergies, current medications, past family history, past medical  history,  past surgical history, past social history  and problem list.  Visual acuity was not assessed per patient preference since she has regular follow up with her ophthalmologist. Hearing and body mass index were assessed and reviewed.   During the course of the visit the patient was educated and counseled about appropriate screening and preventive services including : fall prevention , diabetes screening, nutrition counseling, colorectal cancer screening, and recommended immunizations.    CC: The primary encounter diagnosis was Need for measles-mumps-rubella (MMR) vaccine. Diagnoses of Need for influenza vaccination, Essential hypertension, Prostate cancer screening, Acquired hypothyroidism, Encounter for preventive health examination, and Primary insomnia were also pertinent to this visit.  Feels much much bette since starting thyroid medication in march..  Mental clarity.  Last tsh was 3  Discussed increasing his dose  To 50 mcg   History Jafar has a past medical history of Congenital heart valve abnormality, Dysrhythmia, GERD (gastroesophageal reflux disease), Hypercholesterolemia, and PONV (postoperative nausea and vomiting).   He has a past surgical history that includes Inguinal hernia repair (2006); Tonsillectomy (2001); Anterior cruciate ligament repair (Right, 08/01/2014); TEE without cardioversion (N/A, 08/01/2014); coronary angiogram (10/26/2014); Cardiac catheterization; Bentall procedure (N/A, 11/27/2014); TEE without cardioversion (N/A, 11/27/2014); and Colonoscopy (2010).   His family history includes Arthritis in his mother; Heart disease in his father.He reports that he has never smoked. He has never used smokeless tobacco. He reports that he drinks about 7.0 standard drinks of alcohol per week. He reports that he does not use drugs.  Outpatient Medications  Prior to Visit  Medication Sig Dispense Refill  . ALPRAZolam (XANAX) 0.5 MG tablet TAKE 1 TABLET BY MOUTH AT BEDTIME IF  NEEDED 30 tablet 5  . Ascorbic Acid (VITAMIN C) 1000 MG tablet Take 1,000 mg by mouth daily.    . hydrochlorothiazide (HYDRODIURIL) 12.5 MG tablet Take 1 tablet (12.5 mg total) by mouth daily. 90 tablet 1  . losartan (COZAAR) 50 MG tablet Take 1 tablet (50 mg total) by mouth daily. 90 tablet 1  . metoprolol tartrate (LOPRESSOR) 25 MG tablet TAKE 1 TABLET BY MOUTH TWO  TIMES DAILY 180 tablet 3  . rosuvastatin (CRESTOR) 20 MG tablet TAKE 2 TABLETS BY MOUTH  DAILY 180 tablet 7  . warfarin (COUMADIN) 5 MG tablet TAKE 1/2 TO 1 TABLET BY  MOUTH DAILY AS DIRECTED BY  COUMADIN CLINIC 90 tablet 1  . levothyroxine (SYNTHROID, LEVOTHROID) 25 MCG tablet TAKE 1 TABLET BY MOUTH  DAILY BEFORE BREAKFAST 90 tablet 0   No facility-administered medications prior to visit.     Review of Systems   Patient denies headache, fevers, malaise, unintentional weight loss, skin rash, eye pain, sinus congestion and sinus pain, sore throat, dysphagia,  hemoptysis , cough, dyspnea, wheezing, chest pain, palpitations, orthopnea, edema, abdominal pain, nausea, melena, diarrhea, constipation, flank pain, dysuria, hematuria, urinary  Frequency, nocturia, numbness, tingling, seizures,  Focal weakness, Loss of consciousness,  Tremor, insomnia, depression, anxiety, and suicidal ideation.      Objective:  BP 114/80 (BP Location: Left Arm, Patient Position: Sitting, Cuff Size: Normal)   Pulse (!) 54   Temp 97.9 F (36.6 C) (Oral)   Resp 14   Ht '5\' 10"'$  (1.778 m)   Wt 183 lb 6.4 oz (83.2 kg)   SpO2 96%   BMI 26.32 kg/m   Physical Exam   General appearance: alert, cooperative and appears stated age Ears: normal TM's and external ear canals both ears Throat: lips, mucosa, and tongue normal; teeth and gums normal Neck: no adenopathy, no carotid bruit, supple, symmetrical, trachea midline and thyroid not enlarged, symmetric, no tenderness/mass/nodules Back: symmetric, no curvature. ROM normal. No CVA tenderness. Lungs: clear  to auscultation bilaterally Heart: regular rate and rhythm, S1, S2 normal, no murmur, click, rub or gallop Abdomen: soft, non-tender; bowel sounds normal; no masses,  no organomegaly Pulses: 2+ and symmetric Skin: Skin color, texture, turgor normal. No rashes or lesions Lymph nodes: Cervical, supraclavicular, and axillary nodes normal.    Assessment & Plan:   Problem List Items Addressed This Visit    Acquired hypothyroidism    Will increased doseof  levothyroxine for goal TSH of 1.0  Given his  inability to lose weight despite diet and exercise,       Relevant Medications   levothyroxine (SYNTHROID, LEVOTHROID) 50 MCG tablet   Other Relevant Orders   TSH   Encounter for preventive health examination    Annual comprehensive preventive exam was done as well as an evaluation and management of acute and chronic conditions .  During the course of the visit the patient was educated and counseled about appropriate screening and preventive services including :  diabetes screening, lipid analysis with projected  10 year  risk for CAD , nutrition counseling, prostate and colorectal cancer screening, and recommended immunizations.  Printed recommendations for health maintenance screenings was given.       Essential hypertension    Well controlled on current regimen. Renal function stable, no changes today.  .      Relevant Orders  Comprehensive metabolic panel   Insomnia    Managed with alprazolam for sleep initiation. he has been cautioned not to use this in combination with alcohol.       Other Visit Diagnoses    Need for measles-mumps-rubella (MMR) vaccine    -  Primary   Relevant Orders   MMR vaccine subcutaneous (Completed)   Need for influenza vaccination       Relevant Orders   Flu Vaccine QUAD 6+ mos PF IM (Fluarix Quad PF) (Completed)   Prostate cancer screening       Relevant Orders   PSA      I have changed Stephannie Li. Sugarman's levothyroxine. I am also having him  maintain his vitamin C, rosuvastatin, metoprolol tartrate, warfarin, ALPRAZolam, losartan, and hydrochlorothiazide.  Meds ordered this encounter  Medications  . levothyroxine (SYNTHROID, LEVOTHROID) 50 MCG tablet    Sig: Take 1 tablet (50 mcg total) by mouth daily before breakfast.    Dispense:  90 tablet    Refill:  1    Medications Discontinued During This Encounter  Medication Reason  . levothyroxine (SYNTHROID, LEVOTHROID) 25 MCG tablet     Follow-up: Return in about 6 months (around 11/15/2018).   [name], MD

## 2018-05-17 NOTE — Assessment & Plan Note (Signed)

## 2018-05-17 NOTE — Assessment & Plan Note (Signed)
Managed with Crestor for CAD .

## 2018-05-17 NOTE — Assessment & Plan Note (Signed)
Will increased doseof  levothyroxine for goal TSH of 1.0  Given his  inability to lose weight despite diet and exercise,

## 2018-05-17 NOTE — Assessment & Plan Note (Signed)
Well controlled on current regimen. Renal function stable, no changes today. 

## 2018-05-24 ENCOUNTER — Encounter: Payer: Self-pay | Admitting: Lab

## 2018-06-02 DIAGNOSIS — Z125 Encounter for screening for malignant neoplasm of prostate: Secondary | ICD-10-CM

## 2018-06-09 LAB — PROTIME-INR: INR: 2.8 — AB (ref 0.9–1.1)

## 2018-06-10 ENCOUNTER — Ambulatory Visit (INDEPENDENT_AMBULATORY_CARE_PROVIDER_SITE_OTHER): Payer: Managed Care, Other (non HMO) | Admitting: Pharmacist

## 2018-06-10 DIAGNOSIS — Z7901 Long term (current) use of anticoagulants: Secondary | ICD-10-CM | POA: Diagnosis not present

## 2018-06-10 DIAGNOSIS — Z954 Presence of other heart-valve replacement: Secondary | ICD-10-CM | POA: Diagnosis not present

## 2018-06-10 LAB — PSA, TOTAL AND FREE
PROSTATE SPECIFIC AG, SERUM: 0.5 ng/mL (ref 0.0–4.0)
PSA FREE PCT: 40 %
PSA, Free: 0.2 ng/mL

## 2018-07-05 ENCOUNTER — Other Ambulatory Visit: Payer: Self-pay | Admitting: Internal Medicine

## 2018-07-10 ENCOUNTER — Other Ambulatory Visit: Payer: Self-pay | Admitting: Internal Medicine

## 2018-07-15 LAB — COMPREHENSIVE METABOLIC PANEL
ALK PHOS: 56 IU/L (ref 39–117)
ALT: 28 IU/L (ref 0–44)
AST: 29 IU/L (ref 0–40)
Albumin/Globulin Ratio: 1.8 (ref 1.2–2.2)
Albumin: 4.5 g/dL (ref 3.5–5.5)
BILIRUBIN TOTAL: 0.5 mg/dL (ref 0.0–1.2)
BUN/Creatinine Ratio: 15 (ref 9–20)
BUN: 18 mg/dL (ref 6–24)
CHLORIDE: 99 mmol/L (ref 96–106)
CO2: 24 mmol/L (ref 20–29)
CREATININE: 1.23 mg/dL (ref 0.76–1.27)
Calcium: 9.9 mg/dL (ref 8.7–10.2)
GFR calc Af Amer: 77 mL/min/{1.73_m2} (ref >59)
GFR calc non Af Amer: 67 mL/min/{1.73_m2} (ref >59)
GLUCOSE: 90 mg/dL (ref 65–99)
Globulin, Total: 2.5 g/dL (ref 1.5–4.5)
Potassium: 4.5 mmol/L (ref 3.5–5.2)
Sodium: 139 mmol/L (ref 134–144)
Total Protein: 7 g/dL (ref 6.0–8.5)

## 2018-07-15 LAB — TSH: TSH: 2.07 u[IU]/mL (ref 0.450–4.500)

## 2018-07-18 ENCOUNTER — Other Ambulatory Visit: Payer: Self-pay | Admitting: Internal Medicine

## 2018-07-19 NOTE — Telephone Encounter (Signed)
Refilled: 01/18/2018 Last OV: 05/17/2018 Next OV: 11/15/2018

## 2018-07-20 ENCOUNTER — Ambulatory Visit: Payer: Self-pay

## 2018-07-20 NOTE — Progress Notes (Signed)
Scheduled overdue inr 

## 2018-08-02 ENCOUNTER — Encounter: Payer: Self-pay | Admitting: Internal Medicine

## 2018-08-02 ENCOUNTER — Ambulatory Visit: Payer: Managed Care, Other (non HMO) | Admitting: Internal Medicine

## 2018-08-02 DIAGNOSIS — J209 Acute bronchitis, unspecified: Secondary | ICD-10-CM | POA: Diagnosis not present

## 2018-08-02 MED ORDER — HYDROCOD POLST-CPM POLST ER 10-8 MG/5ML PO SUER: 5.0000 mL | Freq: Every evening | ORAL | 0 refills | Status: DC | PRN

## 2018-08-02 MED ORDER — AMOXICILLIN-POT CLAVULANATE 875-125 MG PO TABS: 1.0000 | ORAL_TABLET | Freq: Two times a day (BID) | ORAL | 0 refills | Status: DC

## 2018-08-02 MED ORDER — PREDNISONE 10 MG PO TABS: ORAL_TABLET | ORAL | 0 refills | Status: DC

## 2018-08-02 NOTE — Progress Notes (Signed)
Subjective:  Patient ID: Frank [name], male    DOB: July 29, 1966  Age: 52 y.o. MRN: [id number]  CC: The encounter diagnosis was Acute bronchitis, unspecified organism.  HPI Frank [name] presents for productive cough for the last 5 days.  Recent  Travel? Ys, to [place] .  Symptoms Started while  Working in [place] .  Denies fevers, body aches.  Taking mucinex and tylenol.  Sleep deprived due to persistent cough in supine position .  Some sinus pressure and pain.  Ribs hurt with cough but not with deep breathing.   Outpatient Medications Prior to Visit  Medication Sig Dispense Refill  . ALPRAZolam (XANAX) 0.5 MG tablet TAKE 1 TABLET BY MOUTH AT BEDTIME AS NEEDED 30 tablet 5  . Ascorbic Acid (VITAMIN C) 1000 MG tablet Take 1,000 mg by mouth daily.    . hydrochlorothiazide (HYDRODIURIL) 12.5 MG tablet TAKE 1 TABLET BY MOUTH  DAILY 90 tablet 1  . levothyroxine (SYNTHROID, LEVOTHROID) 25 MCG tablet TAKE 1 TABLET BY MOUTH  DAILY BEFORE BREAKFAST 90 tablet 0  . levothyroxine (SYNTHROID, LEVOTHROID) 50 MCG tablet Take 1 tablet (50 mcg total) by mouth daily before breakfast. 90 tablet 1  . losartan (COZAAR) 50 MG tablet Take 1 tablet (50 mg total) by mouth daily. 90 tablet 1  . metoprolol tartrate (LOPRESSOR) 25 MG tablet TAKE 1 TABLET BY MOUTH TWO  TIMES DAILY 180 tablet 3  . rosuvastatin (CRESTOR) 20 MG tablet TAKE 2 TABLETS BY MOUTH  DAILY 180 tablet 7  . warfarin (COUMADIN) 5 MG tablet TAKE 1/2 TO 1 TABLET BY  MOUTH DAILY AS DIRECTED BY  COUMADIN CLINIC 90 tablet 1   No facility-administered medications prior to visit.     Review of Systems;  Patient denies headache, fevers, , unintentional weight loss, skin rash, eye pain,  sore throat, dysphagia,  hemoptysis , , dyspnea, wheezing except after a prolonged cough ,  chest pain, palpitations, orthopnea, edema, abdominal pain, nausea, melena, diarrhea, constipation, flank pain, dysuria, hematuria, urinary  Frequency, nocturia, numbness,  tingling, seizures,  Focal weakness, Loss of consciousness,  Tremor, insomnia, depression, anxiety, and suicidal ideation.      Objective:  BP 118/74 (BP Location: Left Arm, Patient Position: Sitting, Cuff Size: Normal)   Pulse 76   Temp 98 F (36.7 C) (Oral)   Resp 16   Ht 5\' 10"  (1.778 m)   Wt 186 lb 9.6 oz (84.6 kg)   SpO2 96%   BMI 26.77 kg/m   BP Readings from Last 3 Encounters:  08/02/18 118/74  05/17/18 114/80  11/12/17 132/90    Wt Readings from Last 3 Encounters:  08/02/18 186 lb 9.6 oz (84.6 kg)  05/17/18 183 lb 6.4 oz (83.2 kg)  11/12/17 187 lb 6.4 oz (85 kg)    General appearance: alert, cooperative and appears stated age Ears:  TM's occluded by cerumen  Throat: lips, mucosa, and tongue normal; teeth and gums normal Face: bilateral maxillary sinus tenderness to palpation Neck: no adenopathy, no carotid bruit, supple, symmetrical, trachea midline and thyroid not enlarged, symmetric, no tenderness/mass/nodules Back: symmetric, no curvature. ROM normal. No CVA tenderness. Lungs: clear to auscultation bilaterally.  No ronchi or egophony  Heart: regular rate and rhythm, S1, S2 normal, no murmur, click, rub or gallop Abdomen: soft, non-tender; bowel sounds normal; no masses,  no organomegaly Pulses: 2+ and symmetric Skin: Skin color, texture, turgor normal. No rashes or lesions Lymph nodes: Cervical, supraclavicular, and axillary nodes normal.  Lab Results  Component Value Date   HGBA1C 5.8 (H) 11/23/2014    Lab Results  Component Value Date   CREATININE 1.23 07/14/2018   CREATININE 1.2 01/28/2018   CREATININE 1.26 01/27/2018    Lab Results  Component Value Date   WBC 6.8 06/02/2017   HGB 14.9 06/02/2017   HCT 42.8 06/02/2017   PLT 241 06/02/2017   GLUCOSE 90 07/14/2018   CHOL 145 01/28/2018   TRIG 204 (H) 12/31/2017   HDL 36 (L) 12/31/2017   LDLCALC 75 01/28/2018   ALT 28 07/14/2018   AST 29 07/14/2018   NA 139 07/14/2018   K 4.5 07/14/2018    CL 99 07/14/2018   CREATININE 1.23 07/14/2018   BUN 18 07/14/2018   CO2 24 07/14/2018   TSH 2.070 07/14/2018   PSA 0.4 04/28/2012   INR 2.8 (A) 06/09/2018   HGBA1C 5.8 (H) 11/23/2014    Dg Chest 2 View  Result Date: 01/03/2015 CLINICAL DATA:  Heart surgery. EXAM: CHEST  2 VIEW COMPARISON:  11/30/2014. FINDINGS: Prior aortic valve replacement. Cardiomegaly. No pulmonary venous congestion. No focal pulmonary infiltrate. No pleural effusion or pneumothorax. No acute bony abnormality . IMPRESSION: Aortic valve replacement. Mild cardiomegaly. No CHF. No acute pulmonary disease. Electronically Signed   By: Marcello Moores  Register   On: 01/03/2015 11:28    Assessment & Plan:   Problem List Items Addressed This Visit    Bronchitis, acute    Prednisone taper and tussionex,  Antibiotics added due to sinus pain suggestive of concurrent sinusitis probiotic dvised          I have discontinued Frank [name]'s chlorpheniramine-HYDROcodone. I am also having him start on amoxicillin-clavulanate, predniSONE, and chlorpheniramine-HYDROcodone. Additionally, I am having him maintain his vitamin C, rosuvastatin, metoprolol tartrate, warfarin, losartan, levothyroxine, hydrochlorothiazide, levothyroxine, and ALPRAZolam.  Meds ordered this encounter  Medications  . amoxicillin-clavulanate (AUGMENTIN) 875-125 MG tablet    Sig: Take 1 tablet by mouth 2 (two) times daily.    Dispense:  14 tablet    Refill:  0  . predniSONE (DELTASONE) 10 MG tablet    Sig: 6 tablets on Day 1 , then reduce by 1 tablet daily until gone    Dispense:  21 tablet    Refill:  0  . DISCONTD: chlorpheniramine-HYDROcodone (TUSSIONEX PENNKINETIC ER) 10-8 MG/5ML SUER    Sig: Take 5 mLs by mouth at bedtime as needed for cough.    Dispense:  140 mL    Refill:  0  . chlorpheniramine-HYDROcodone (TUSSIONEX PENNKINETIC ER) 10-8 MG/5ML SUER    Sig: Take 5 mLs by mouth at bedtime as needed for cough.    Dispense:  140 mL    Refill:  0     Medications Discontinued During This Encounter  Medication Reason  . chlorpheniramine-HYDROcodone (TUSSIONEX PENNKINETIC ER) 10-8 MG/5ML SUER     Follow-up: No follow-ups on file.   [name], MD

## 2018-08-02 NOTE — Patient Instructions (Signed)
I am treating you for sinusitis/bronchitis  which is a complication from your viral infection due to  persistent sinus congestion.   I am prescribing an antibiotic (augmentin ) and a prednisone taper  To manage the infection and the inflammation in your ear/sinuses.   The prednisone can be taken as follows:  6 tablets all at once on Day 1,  Then taper by 1 tablet daily until gone  I also advise use of the following OTC meds to help with your other symptoms.   Take generic OTC benadryl 25 mg every 8 hours for the drainage,  Sudafed PE  10 to 30 mg every 8 hours for the congestion, you may substitute Afrin nasal spray if you do not tolerate the side  Effects .  Gargle with salt water as needed for sore throat.   Use the tussionex liquid cough syrup at night for severe cough or headache.  It has vicodin in it so DO NOT SHARE WITH OTHERS  Please take a probiotic ( Align, Floraque or Culturelle) while you are on the antibiotic to prevent  the  serious antibiotic associated diarrhea  Called clostridium dificile colitis and a vaginal yeast infection

## 2018-08-03 DIAGNOSIS — J209 Acute bronchitis, unspecified: Secondary | ICD-10-CM | POA: Insufficient documentation

## 2018-08-03 NOTE — Assessment & Plan Note (Signed)
Prednisone taper and tussionex,  Antibiotics added due to sinus pain suggestive of concurrent sinusitis probiotic dvised

## 2018-08-18 ENCOUNTER — Other Ambulatory Visit: Payer: Self-pay | Admitting: Internal Medicine

## 2018-08-20 ENCOUNTER — Other Ambulatory Visit: Payer: Self-pay

## 2018-08-24 ENCOUNTER — Other Ambulatory Visit: Payer: Self-pay

## 2018-08-24 LAB — POCT INR: INR: 2.6 (ref 2.0–3.0)

## 2018-08-24 MED ORDER — LEVOTHYROXINE SODIUM 50 MCG PO TABS: 50.0000 ug | ORAL_TABLET | Freq: Every day | ORAL | 1 refills | Status: DC

## 2018-08-25 ENCOUNTER — Ambulatory Visit (INDEPENDENT_AMBULATORY_CARE_PROVIDER_SITE_OTHER): Payer: Managed Care, Other (non HMO) | Admitting: Cardiology

## 2018-08-25 DIAGNOSIS — Z954 Presence of other heart-valve replacement: Secondary | ICD-10-CM | POA: Diagnosis not present

## 2018-09-02 ENCOUNTER — Ambulatory Visit: Payer: Managed Care, Other (non HMO) | Admitting: Internal Medicine

## 2018-09-02 ENCOUNTER — Encounter: Payer: Self-pay | Admitting: Internal Medicine

## 2018-09-02 VITALS — BP 116/72 | HR 54 | Ht 70.0 in | Wt 187.4 lb

## 2018-09-02 DIAGNOSIS — Z7901 Long term (current) use of anticoagulants: Secondary | ICD-10-CM

## 2018-09-02 DIAGNOSIS — I2510 Atherosclerotic heart disease of native coronary artery without angina pectoris: Secondary | ICD-10-CM | POA: Diagnosis not present

## 2018-09-02 DIAGNOSIS — I10 Essential (primary) hypertension: Secondary | ICD-10-CM

## 2018-09-02 DIAGNOSIS — E785 Hyperlipidemia, unspecified: Secondary | ICD-10-CM | POA: Diagnosis not present

## 2018-09-02 DIAGNOSIS — Z954 Presence of other heart-valve replacement: Secondary | ICD-10-CM

## 2018-09-02 NOTE — Patient Instructions (Signed)
Medication Instructions:  Continue current medications If you need a refill on your cardiac medications before your next appointment, please call your pharmacy.   Testing/Procedures: Your physician has requested that you have an echocardiogram. Echocardiography is a painless test that uses sound waves to create images of your heart. It provides your doctor with information about the size and shape of your heart and how well your heart's chambers and valves are working. This procedure takes approximately one hour. There are no restrictions for this procedure. -- please complete prior to next annual visit (Jan 2021)  Follow-Up: At [facility], you and your health needs are our priority.  As part of our continuing mission to provide you with exceptional heart care, we have created designated Provider Care Teams.  These Care Teams include your primary Cardiologist (physician) and Advanced Practice Providers (APPs -  Physician Assistants and Nurse Practitioners) who all work together to provide you with the care you need, when you need it. You will need a follow up appointment in 12 months.  Please call our office 2 months in advance to schedule this appointment.  You may see Dr. Debara Pickett or one of the following Advanced Practice Providers on your designated Care Team: Almyra Deforest, [place] . Fabian Sharp, PA-C  Any Other Special Instructions Will Be Listed Below (If Applicable).

## 2018-09-02 NOTE — Progress Notes (Signed)
OFFICE NOTE  Chief Complaint:  No complaints  Primary Care Physician: [name], MD  HPI:  Frank [name] is a 53 y.o. male with a history of at least moderate AS (followed by Dr. [name] at the [facility] presented for ACL repair with Dr. [name]. Preoperatively, he was evaluated by Dr. [name] (cardiac anesthesia) and found to have a loud AS murmur- After some discussion, interoperative TEE was recommended and performed. I personally reviewed the TEE images which do demonstrate severe aortic stenosis, moderate AI and mild to moderate MR (obtained during surgery, on pressor, PPV, etc, but still significant). He was recently told he probably had another 5 years before needing to consider surgery. Mean gradient is already above 40 mmHg. Clinically, he has been asymptomatic (no chest pain, dyspnea, syncope, etc,) but recently he has had more palpitations (PVC's) and was started on b-blocker. He did manage to get through surgery uneventfully.   He returns today in follow-up to the office. He continues to deny any chest pain, worsening shortness of breath, presyncope or syncopal episodes. He has not pushed himself significantly due to his recent knee surgery. He is undergoing rehabilitation and getting stronger. A repeat echocardiogram was just performed as an outpatient. This was initially read by one of my partners and indicated mild to moderate aortic stenosis based on a mean valve gradient in the 20s. However subsequent images during the study do indicate mean gradients greater than 50 from the right upper sternal border using Pedoff probe. I therefore personally reviewed the echocardiogram images and made the appropriate corrections, and the results are as follows:  Study Conclusions  - Left ventricle: The cavity size was normal. Wall thickness was increased in a pattern of mild LVH. Systolic function was normal. The estimated ejection fraction was in the range of 55% to  60%. - Aortic valve: The aortic valve is bicuspid and is heavily calcified with restricted leaflet motion. Peak and mean gradients through the valve are 100 and 59 mm Hg respectively, consistent with severe AS. The calculated AVA is 0.6 cm2. There is moderate, eccentric AI. There was mild regurgitation. - Aorta: Aortic root dimension: 39 mm (ED). - Aortic root: The aortic root is mildly dilated. - Mitral valve: Calcified annulus. Mildly thickened leaflets . There was mild regurgitation. - Left atrium: LA Volume/BSA= 46.4 ml/m2. The atrium is moderately dilated.  Impressions:  - LVEF 60-65%, moderate concentric LVH, bicuspid and severely stenotic aortic valve- AVA 0.6 cm2, peak and mean gradients of 100 mmHg and 59 mmHg, respectively - LVOT diameter measured at 2.4 cm. There is moderate AI. There is mild to moderate MR with restricted anterior leaflet motion secondary to eccentric AI.   Pedoff measurements do indeed show a very high mean gradient of 59 and peak gradient of 100 mmHg. Based on a generous LVOT diameter of 2.4 cm, the calculated aortic valve gradient was 0.6 cm consistent with severe aortic stenosis.  I saw Frank [name] back in the office today. He was recently on a trip and noticed increasing shortness of breath and easy fatigue with exercise which was a new finding for him. He feels that this is symptomatic aortic stenosis and I would agree. I discussed the case further with Dr. Cyndia Bent, the cardiac thoracic surgeon that he's been seeing, and we feel that it's probably the time now to entertain valve replacement. Based on that there is also concern about aortopathy given his bicuspid valve. He underwent a CT scan of  the aorta and coronaries which showed does reveal multivessel coronary artery calcification as well as calcium deposits in the left main coronary. The aortic valve is heavily calcified and restricted in motion. There was at least mild cardiac  hypertrophy and dilatation of the left ventricle. The ascending aorta is dilated measuring 4.3-4.4 cm. There is no evidence for coarctation or enlargement of the thoracic or descending aorta.  Frank [name] returns today for follow-up. He successfully underwent Bentall procedure with hemi-arch replacement using a 28 mm Hemashield graft under deep hypothermic circulatory arrest and Bentall Procedure ( replacement of aortic valve and aortic root with reimplantation of the coronary arteries) using a 21 mm St. Jude mechanical valve graft. Overall he is doing exceedingly well. He continues to do exercise. He is also recovering from recent knee surgery. He denies any chest pain or worsening shortness of breath. His energy levels improved significantly. His median sternotomy is healing well. He did have some postoperative anemia however this seems to have improved and his energy level is better.  I saw Frank [name] back in the office today for follow-up. He is doing exceedingly well. He's been active and working and traveling without any restrictions. He denies any chest pain or shortness of breath. He's had a small amount of weight gain however BMI is only 26. He feels better than he has in years he says. Recently we increased his cholesterol medication as his LDL was still not treated to goal. Particle number was just over 1100. He is due for recheck of this next month. He says he does get some mild muscle soreness after exercise that does not resolve quickly which he may attribute to increase in Crestor.  01/16/2016  Frank [name] returns today for follow-up. He reports that he is doing exceedingly well. His last use been excellent for him. He is exercising more regularly. He's had a small amount of weight gain but is less than 5 pounds and is starting to come down. He denies any chest pain or shortness of breath. His blood pressure is slightly higher recently however and he was started on losartan 50 mg daily.  Initial blood pressure today was 130/90 however recheck was 110/80 in the left arm and 112/82 in the right arm. He was concerned about some differential blood pressures in the past however this difference is negligible. In discussion today we realized he had not had an echocardiogram since his valve surgery and we need to establish a baseline valve gradient.  08/22/2016  Frank [name] returns today for follow-up. He says is feeling exceedingly well. He exercises regularly now and has a better energy level. He denies any shortness of breath or chest pain. He's been compliant with his INR checks and is Coumadin levels have been appropriate. Blood pressure appears well controlled. He has excellent cholesterol control with LDL in the 80s on rosuvastatin.   08/28/2017  Frank [name] is doing well and is without complaints. He is pleased with his AVR. He exercises regularly and has gained some muscle mass. He has maintained a therapeutic INR. He had an echo this past Summer which demonstrated a stable aortic valve gradient and normal LV function. BP is well controlled. Recent labs show LDL of 81 on Crestor.  09/02/2018  Frank [name] is seen today in follow-up.  He is doing extremely well.  He says he is not felt this great in years.  He was struggling with some palpitations however is added some supplementary potassium to his diet which is  helped his symptoms.  He has been therapeutic on warfarin which is been stable.  Weight is stable.  Blood pressure is now well controlled.  He denies any chest pain or shortness of breath.  He exercises several times a week.  PMHx:  Past Medical History:  Diagnosis Date  . Congenital heart valve abnormality    bicuspid aortic valve  . Dysrhythmia    PVC's  . GERD (gastroesophageal reflux disease)   . Hypercholesterolemia   . PONV (postoperative nausea and vomiting)    after a tonsillectomy..none since    Past Surgical History:  Procedure Laterality Date  .  ANTERIOR CRUCIATE LIGAMENT REPAIR Right 08/01/2014   Procedure: RECONSTRUCTION ANTERIOR CRUCIATE LIGAMENT (ACL) WITH HAMSTRING GRAFT, MENISCAL DEBRIDEMENT AS NEEDED.;  Surgeon: Meredith Pel, MD;  Location: [place];  Service: Orthopedics;  Laterality: Right;  . BENTALL PROCEDURE N/A 11/27/2014   Procedure: BENTALL PROCEDURE;  Surgeon: [name], MD;  Location: [place];  Service: Open Heart Surgery;  Laterality: N/A;  WITH CIRC ARREST  . CARDIAC CATHETERIZATION     10/26/2014  . COLONOSCOPY  2010  . CORONARY ANGIOGRAM  10/26/2014   Procedure: CORONARY ANGIOGRAM;  Surgeon: [name], MD;  Location: [facility] CATH LAB;  Service: Cardiovascular;;  . INGUINAL HERNIA REPAIR  2006   left, Dr. Bary Castilla  . TEE WITHOUT CARDIOVERSION N/A 08/01/2014   Procedure: TRANSESOPHAGEAL ECHOCARDIOGRAM (TEE);  Surgeon: Meredith Pel, MD;  Location: [place];  Service: Orthopedics;  Laterality: N/A;  . TEE WITHOUT CARDIOVERSION N/A 11/27/2014   Procedure: TRANSESOPHAGEAL ECHOCARDIOGRAM (TEE);  Surgeon: [name], MD;  Location: [place];  Service: Open Heart Surgery;  Laterality: N/A;  . TONSILLECTOMY  2001    FAMHx:  Family History  Problem Relation Age of Onset  . Arthritis Mother        psoriatis, Crohn's   . Heart disease Father        CAD in his early 75s    SOCHx:   reports that he has never smoked. He has never used smokeless tobacco. He reports current alcohol use of about 7.0 standard drinks of alcohol per week. He reports that he does not use drugs.  ALLERGIES:  No Known Allergies  ROS: Pertinent items noted in HPI and remainder of comprehensive ROS otherwise negative.  HOME MEDS: Current Outpatient Medications on File Prior to Visit  Medication Sig Dispense Refill  . ALPRAZolam (XANAX) 0.5 MG tablet TAKE 1 TABLET BY MOUTH AT BEDTIME AS NEEDED 30 tablet 5  . Ascorbic Acid (VITAMIN C) 1000 MG tablet Take 1,000 mg by mouth daily.    . hydrochlorothiazide (HYDRODIURIL) 12.5 MG tablet  TAKE 1 TABLET BY MOUTH  DAILY 90 tablet 1  . levothyroxine (SYNTHROID, LEVOTHROID) 50 MCG tablet Take 1 tablet (50 mcg total) by mouth daily before breakfast. 90 tablet 1  . losartan (COZAAR) 50 MG tablet TAKE 1 TABLET BY MOUTH  DAILY 90 tablet 1  . metoprolol tartrate (LOPRESSOR) 25 MG tablet TAKE 1 TABLET BY MOUTH TWO  TIMES DAILY 180 tablet 3  . rosuvastatin (CRESTOR) 20 MG tablet TAKE 2 TABLETS BY MOUTH  DAILY 180 tablet 7  . warfarin (COUMADIN) 5 MG tablet TAKE 1/2 TO 1 TABLET BY  MOUTH DAILY AS DIRECTED BY  COUMADIN CLINIC 90 tablet 1   No current facility-administered medications on file prior to visit.     LABS/IMAGING: No results found for this or any previous visit (from the past 48 hour(s)). No  results found.  WEIGHTS: Wt Readings from Last 3 Encounters:  09/02/18 187 lb 6.4 oz (85 kg)  08/02/18 186 lb 9.6 oz (84.6 kg)  05/17/18 183 lb 6.4 oz (83.2 kg)    VITALS: BP 116/72   Pulse (!) 54   Ht 5\' 10"  (1.778 m)   Wt 187 lb 6.4 oz (85 kg)   BMI 26.89 kg/m   EXAM: General appearance: alert and no distress Neck: no carotid bruit and no JVD Lungs: clear to auscultation bilaterally Heart: regular rate and rhythm, S1, S2 normal and Sharp mechanical valve sounds Abdomen: soft, non-tender; bowel sounds normal; no masses,  no organomegaly Extremities: extremities normal, atraumatic, no cyanosis or edema Pulses: 2+ and symmetric Skin: Skin color, texture, turgor normal. No rashes or lesions Neurologic: Grossly normal I: Pleasant  EKG: Sinus bradycardia 54, nonspecific T wave changes-personally reviewed  ASSESSMENT: 1. Status post aVR with the mechanical valve (11/27/2014) 2. 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 Mild nonobstructive coronary disease 4. Dyslipidemia 5. History of bicuspid aortic valve with aortopathy  PLAN: 1.   Frank [name] continues to do well after mechanical aortic valve replacement and hemi-arch repair for aneurysm.   He will be due for repeat echocardiogram next year.  He had some mild nonobstructive coronary disease but is asymptomatic with this.  His cholesterols been well controlled on statin therapy.  Plan follow-up with me annually or sooner as necessary.  [name], MD, [facility], [place] Director of the Advanced Lipid Disorders &  Cardiovascular Risk Reduction Clinic Diplomate of the American Board of Clinical Lipidology Attending Cardiologist  Direct Dial: [phone]  Fax: [phone]  Website:  [email or web address] 09/02/2018, 9:08 AM

## 2018-10-18 LAB — PROTIME-INR: INR: 3.2 — AB (ref <=1.1)

## 2018-10-20 ENCOUNTER — Telehealth: Payer: Self-pay

## 2018-10-20 ENCOUNTER — Encounter: Payer: Self-pay | Admitting: Internal Medicine

## 2018-10-20 NOTE — Telephone Encounter (Signed)
Called pt left msg for overdue coumadin appt needed

## 2018-10-21 LAB — PROTIME-INR

## 2018-10-22 ENCOUNTER — Ambulatory Visit (INDEPENDENT_AMBULATORY_CARE_PROVIDER_SITE_OTHER): Payer: Managed Care, Other (non HMO) | Admitting: Pharmacist Clinician (PhC)/ Clinical Pharmacy Specialist

## 2018-10-22 DIAGNOSIS — Z954 Presence of other heart-valve replacement: Secondary | ICD-10-CM

## 2018-10-22 DIAGNOSIS — Z7901 Long term (current) use of anticoagulants: Secondary | ICD-10-CM | POA: Diagnosis not present

## 2018-11-06 ENCOUNTER — Other Ambulatory Visit: Payer: Self-pay | Admitting: Internal Medicine

## 2018-11-15 ENCOUNTER — Ambulatory Visit: Payer: Managed Care, Other (non HMO) | Admitting: Internal Medicine

## 2018-11-24 ENCOUNTER — Telehealth: Payer: Self-pay | Admitting: Pharmacist

## 2018-11-24 NOTE — Telephone Encounter (Signed)
LMOM; reminder INR overdue. Complete at [facility] ASAP.

## 2018-11-25 LAB — PROTIME-INR: INR: 3.1 — AB (ref 0.9–1.1)

## 2018-11-26 ENCOUNTER — Ambulatory Visit (INDEPENDENT_AMBULATORY_CARE_PROVIDER_SITE_OTHER): Payer: Managed Care, Other (non HMO) | Admitting: Pharmacist Clinician (PhC)/ Clinical Pharmacy Specialist

## 2018-11-26 DIAGNOSIS — Z5181 Encounter for therapeutic drug level monitoring: Secondary | ICD-10-CM

## 2018-11-26 DIAGNOSIS — Z954 Presence of other heart-valve replacement: Secondary | ICD-10-CM | POA: Diagnosis not present

## 2018-12-06 ENCOUNTER — Other Ambulatory Visit: Payer: Self-pay | Admitting: Internal Medicine

## 2018-12-22 ENCOUNTER — Other Ambulatory Visit: Payer: Self-pay | Admitting: Internal Medicine

## 2018-12-29 LAB — PROTIME-INR: INR: 3.5 — AB (ref 0.9–1.1)

## 2018-12-30 ENCOUNTER — Ambulatory Visit (INDEPENDENT_AMBULATORY_CARE_PROVIDER_SITE_OTHER): Payer: Managed Care, Other (non HMO) | Admitting: Pharmacist Clinician (PhC)/ Clinical Pharmacy Specialist

## 2018-12-30 DIAGNOSIS — Z954 Presence of other heart-valve replacement: Secondary | ICD-10-CM

## 2018-12-30 DIAGNOSIS — Z5181 Encounter for therapeutic drug level monitoring: Secondary | ICD-10-CM | POA: Diagnosis not present

## 2019-01-14 ENCOUNTER — Ambulatory Visit (INDEPENDENT_AMBULATORY_CARE_PROVIDER_SITE_OTHER): Payer: Managed Care, Other (non HMO) | Admitting: Pharmacist Clinician (PhC)/ Clinical Pharmacy Specialist

## 2019-01-14 DIAGNOSIS — Z7901 Long term (current) use of anticoagulants: Secondary | ICD-10-CM | POA: Diagnosis not present

## 2019-01-14 DIAGNOSIS — Z954 Presence of other heart-valve replacement: Secondary | ICD-10-CM

## 2019-01-14 LAB — PROTIME-INR: INR: 2.3 — AB (ref 0.9–1.1)

## 2019-01-31 ENCOUNTER — Other Ambulatory Visit: Payer: Self-pay | Admitting: Internal Medicine

## 2019-02-05 ENCOUNTER — Other Ambulatory Visit: Payer: Self-pay | Admitting: Internal Medicine

## 2019-03-03 ENCOUNTER — Encounter: Payer: Self-pay | Admitting: Internal Medicine

## 2019-03-03 LAB — PROTIME-INR

## 2019-03-24 ENCOUNTER — Telehealth: Payer: Self-pay

## 2019-03-24 NOTE — Telephone Encounter (Signed)
voicemailbox full unable to lmom for overdue inr

## 2019-04-12 ENCOUNTER — Ambulatory Visit (INDEPENDENT_AMBULATORY_CARE_PROVIDER_SITE_OTHER): Payer: Managed Care, Other (non HMO) | Admitting: Pharmacist

## 2019-04-12 DIAGNOSIS — Z7901 Long term (current) use of anticoagulants: Secondary | ICD-10-CM

## 2019-04-12 DIAGNOSIS — Z954 Presence of other heart-valve replacement: Secondary | ICD-10-CM

## 2019-04-12 LAB — PROTIME-INR: INR: 2 — AB (ref 0.9–1.1)

## 2019-04-20 ENCOUNTER — Ambulatory Visit (INDEPENDENT_AMBULATORY_CARE_PROVIDER_SITE_OTHER): Payer: Managed Care, Other (non HMO) | Admitting: Internal Medicine

## 2019-04-20 ENCOUNTER — Encounter: Payer: Self-pay | Admitting: Internal Medicine

## 2019-04-20 ENCOUNTER — Other Ambulatory Visit: Payer: Self-pay

## 2019-04-20 DIAGNOSIS — H7292 Unspecified perforation of tympanic membrane, left ear: Secondary | ICD-10-CM

## 2019-04-20 DIAGNOSIS — H6692 Otitis media, unspecified, left ear: Secondary | ICD-10-CM | POA: Diagnosis not present

## 2019-04-20 MED ORDER — AMOXICILLIN-POT CLAVULANATE 875-125 MG PO TABS: 1.0000 | ORAL_TABLET | Freq: Two times a day (BID) | ORAL | 0 refills | Status: DC

## 2019-04-20 MED ORDER — PREDNISONE 10 MG PO TABS: ORAL_TABLET | ORAL | 0 refills | Status: DC

## 2019-04-20 NOTE — Progress Notes (Signed)
Virtual Visit via [email or web address]  This visit type was conducted due to national recommendations for restrictions regarding the COVID-19 pandemic (e.g. social distancing).  This format is felt to be most appropriate for this patient at this time.  All issues noted in this document were discussed and addressed.  No physical exam was performed (except for noted visual exam findings with Video Visits).   I connected with@ on 04/20/19 at 10:00 AM EDT by a video enabled telemedicine application and verified that I am speaking with the correct person using two identifiers. Location patient: home Location provider: work or home office Persons participating in the virtual visit: patient, provider  I discussed the limitations, risks, security and privacy concerns of performing an evaluation and management service by telephone and the availability of in person appointments. I also discussed with the patient that there may be a patient responsible charge related to this service. The patient expressed understanding and agreed to proceed.  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  Has not scuba dived in 9 months,  Has not flown in 2 weeks.  No preceding sinus symptoms.  Denies dizziness, headache.  Pressure and decreased  Hearing from left ear since Sunday.  Not getting worse.     ROS: See pertinent positives and negatives per HPI.  Past Medical History:  Diagnosis Date  . Congenital heart valve abnormality    bicuspid aortic valve  . Dysrhythmia    PVC's  . GERD (gastroesophageal reflux disease)   . Hypercholesterolemia   . PONV (postoperative nausea and vomiting)    after a tonsillectomy..none since    Past Surgical History:  Procedure Laterality Date  . ANTERIOR CRUCIATE LIGAMENT REPAIR Right 08/01/2014   Procedure: RECONSTRUCTION ANTERIOR CRUCIATE LIGAMENT (ACL) WITH HAMSTRING  GRAFT, MENISCAL DEBRIDEMENT AS NEEDED.;  Surgeon: Meredith Pel, MD;  Location: [place];  Service: Orthopedics;  Laterality: Right;  . BENTALL PROCEDURE N/A 11/27/2014   Procedure: BENTALL PROCEDURE;  Surgeon: [name], MD;  Location: [place];  Service: Open Heart Surgery;  Laterality: N/A;  WITH CIRC ARREST  . CARDIAC CATHETERIZATION     10/26/2014  . COLONOSCOPY  2010  . CORONARY ANGIOGRAM  10/26/2014   Procedure: CORONARY ANGIOGRAM;  Surgeon: [name], MD;  Location: [facility] C[facility] LAB;  Service: Cardiovascular;;  . INGUINAL HERNIA REPAIR  2006   left, Dr. Bary Castilla  . TEE WITHOUT CARDIOVERSION N/A 08/01/2014   Procedure: TRANSESOPHAGEAL ECHOCARDIOGRAM (TEE);  Surgeon: Meredith Pel, MD;  Location: [place];  Service: Orthopedics;  Laterality: N/A;  . TEE WITHOUT CARDIOVERSION N/A 11/27/2014   Procedure: TRANSESOPHAGEAL ECHOCARDIOGRAM (TEE);  Surgeon: [name], MD;  Location: [place];  Service: Open Heart Surgery;  Laterality: N/A;  . TONSILLECTOMY  2001    Family History  Problem Relation Age of Onset  . Arthritis Mother        psoriatis, Crohn's   . Heart disease Father        CAD in his early 28s    SOCIAL HX:  reports that he has never smoked. He has never used smokeless tobacco. He reports current alcohol use of about 7.0 standard drinks of alcohol per week. He reports that he does not use drugs.   Current Outpatient Medications:  .  ALPRAZolam (XANAX) 0.5 MG tablet, TAKE 1 TABLET BY MOUTH EVERY DAY AT BEDTIME  AS NEEDED, Disp: 30 tablet, Rfl: 5 .  Ascorbic Acid (VITAMIN C) 1000 MG tablet, Take 1,000 mg by mouth daily., Disp: , Rfl:  .  hydrochlorothiazide (HYDRODIURIL) 12.5 MG tablet, TAKE 1 TABLET BY MOUTH  DAILY, Disp: 90 tablet, Rfl: 1 .  levothyroxine (SYNTHROID) 50 MCG tablet, TAKE 1 TABLET BY MOUTH  DAILY BEFORE BREAKFAST, Disp: 90 tablet, Rfl: 1 .  losartan (COZAAR) 50 MG tablet, TAKE 1 TABLET BY MOUTH  DAILY, Disp: 90 tablet, Rfl: 1 .  metoprolol tartrate  (LOPRESSOR) 25 MG tablet, TAKE 1 TABLET BY MOUTH TWO  TIMES DAILY, Disp: 180 tablet, Rfl: 3 .  rosuvastatin (CRESTOR) 20 MG tablet, TAKE 2 TABLETS BY MOUTH  DAILY, Disp: 180 tablet, Rfl: 7 .  warfarin (COUMADIN) 5 MG tablet, TAKE 1/2 TO 1 TABLET BY  MOUTH DAILY AS DIRECTED BY  COUMADIN CLINIC, Disp: 90 tablet, Rfl: 1 .  amoxicillin-clavulanate (AUGMENTIN) 875-125 MG tablet, Take 1 tablet by mouth 2 (two) times daily., Disp: 20 tablet, Rfl: 0 .  predniSONE (DELTASONE) 10 MG tablet, 6 tablets on Day 1 , then reduce by 1 tablet daily until gone, Disp: 21 tablet, Rfl: 0  EXAM:  VITALS per patient if applicable:  GENERAL: alert, oriented, appears well and in no acute distress  HEENT: atraumatic, conjunttiva clear, no obvious abnormalities on inspection of external nose and ears  NECK: normal movements of the head and neck  LUNGS: on inspection no signs of respiratory distress, breathing rate appears normal, no obvious gross SOB, gasping or wheezing  CV: no obvious cyanosis  MS: moves all visible extremities without noticeable abnormality  PSYCH/NEURO: pleasant and cooperative, no obvious depression or anxiety, speech and thought processing grossly intact  ASSESSMENT AND PLAN:  Discussed the following assessment and plan:  Acute otitis media of left ear with perforation  Acute otitis media of left ear with perforation prescribing augmentin x 10 days along with prednisone taper to start after 24 hours. ent evaluation if no improvement.   Advised to use oral decongestants if needed.  Probiotic advised.  No swimming for 2 weeks minimum.     I discussed the assessment and treatment plan with the patient. The patient was provided an opportunity to ask questions and all were answered. The patient agreed with the plan and demonstrated an understanding of the instructions.   The patient was advised to call back or seek an in-person evaluation if the symptoms worsen or if the condition fails to  improve as anticipated.  I provided 20 minutes of non-face-to-face time during this encounter providing counselling on management of otitis media with tympanic membrane rupture .   [name], MD

## 2019-04-20 NOTE — Progress Notes (Signed)
Pt stated that he went swimming Saturday with his granddaughter. When he woke up on Sunday morning he cleaned his ears and noticed some blood in the left ear canal. The blood has gone but the "muffled" sound and slight loss of hearing is still there.

## 2019-04-20 NOTE — Assessment & Plan Note (Addendum)
prescribing augmentin x 10 days along with prednisone taper to start after 24 hours. ent evaluation if no improvement.   Advised to use oral decongestants if needed.  Probiotic advised.  No swimming for 2 weeks minimum.

## 2019-04-22 ENCOUNTER — Ambulatory Visit (INDEPENDENT_AMBULATORY_CARE_PROVIDER_SITE_OTHER): Payer: Managed Care, Other (non HMO)

## 2019-04-22 ENCOUNTER — Other Ambulatory Visit: Payer: Self-pay

## 2019-04-22 DIAGNOSIS — Z23 Encounter for immunization: Secondary | ICD-10-CM

## 2019-05-13 ENCOUNTER — Ambulatory Visit (INDEPENDENT_AMBULATORY_CARE_PROVIDER_SITE_OTHER): Payer: Managed Care, Other (non HMO) | Admitting: Pharmacist

## 2019-05-13 DIAGNOSIS — Z954 Presence of other heart-valve replacement: Secondary | ICD-10-CM

## 2019-05-13 DIAGNOSIS — Z7901 Long term (current) use of anticoagulants: Secondary | ICD-10-CM | POA: Diagnosis not present

## 2019-05-13 LAB — PROTIME-INR: INR: 2.8 — AB (ref 0.9–1.1)

## 2019-05-18 ENCOUNTER — Encounter: Payer: Managed Care, Other (non HMO) | Admitting: Internal Medicine

## 2019-05-24 ENCOUNTER — Other Ambulatory Visit: Payer: Self-pay

## 2019-05-25 ENCOUNTER — Ambulatory Visit (INDEPENDENT_AMBULATORY_CARE_PROVIDER_SITE_OTHER): Payer: Managed Care, Other (non HMO) | Admitting: Internal Medicine

## 2019-05-25 ENCOUNTER — Encounter: Payer: Self-pay | Admitting: Internal Medicine

## 2019-05-25 ENCOUNTER — Other Ambulatory Visit: Payer: Self-pay

## 2019-05-25 VITALS — BP 148/90 | HR 82 | Temp 97.4°F | Resp 14 | Ht 70.0 in | Wt 186.2 lb

## 2019-05-25 DIAGNOSIS — Z0001 Encounter for general adult medical examination with abnormal findings: Secondary | ICD-10-CM

## 2019-05-25 DIAGNOSIS — Z23 Encounter for immunization: Secondary | ICD-10-CM

## 2019-05-25 DIAGNOSIS — N62 Hypertrophy of breast: Secondary | ICD-10-CM | POA: Diagnosis not present

## 2019-05-25 DIAGNOSIS — Z0000 Encounter for general adult medical examination without abnormal findings: Secondary | ICD-10-CM | POA: Diagnosis not present

## 2019-05-25 DIAGNOSIS — E785 Hyperlipidemia, unspecified: Secondary | ICD-10-CM

## 2019-05-25 DIAGNOSIS — I10 Essential (primary) hypertension: Secondary | ICD-10-CM | POA: Diagnosis not present

## 2019-05-25 DIAGNOSIS — E039 Hypothyroidism, unspecified: Secondary | ICD-10-CM

## 2019-05-25 NOTE — Progress Notes (Signed)
Patient ID: Frank [name], male    DOB: 1965/09/24  Age: 53 y.o. MRN: [id number]  The patient is here for annual preventive examination and management of other chronic and acute problems.   The risk factors are reflected in the social history.  The roster of all physicians providing medical care to patient - is listed in the Snapshot section of the chart.  Activities of daily living:  The patient is 100% independent in all ADLs: dressing, toileting, feeding as well as independent mobility  Home safety : The patient has smoke detectors in the home. They wear seatbelts.  There are no firearms at home. There is no violence in the home.   There is no risks for hepatitis, STDs or HIV. There is no   history of blood transfusion. They have no travel history to infectious disease endemic areas of the world.  The patient has seen their dentist in the last six month. They have seen their eye doctor in the last year. T They do not  have excessive sun exposure. Discussed the need for sun protection: hats, long sleeves and use of sunscreen if there is significant sun exposure.   Diet: the importance of a healthy diet is discussed. They do have a healthy diet.  The benefits of regular aerobic exercise were discussed. He exercises  4 times per week ,  45 minutes.   Depression screen: there are no signs or vegative symptoms of depression- irritability, change in appetite, anhedonia, sadness/tearfullness.  The following portions of the patient's history were reviewed and updated as appropriate: allergies, current medications, past family history, past medical history,  past surgical history, past social history  and problem list.  Visual acuity was not assessed per patient preference since she has regular follow up with her ophthalmologist. Hearing and body mass index were assessed and reviewed.   During the course of the visit the patient was educated and counseled about appropriate screening and  preventive services including : fall prevention , diabetes screening, nutrition counseling, colorectal cancer screening, and recommended immunizations.    CC: The primary encounter diagnosis was Gynecomastia, male. Diagnoses of Need for shingles vaccine, Encounter for general adult medical examination with abnormal findings, Essential hypertension, Dyslipidemia, and Acquired hypothyroidism were also pertinent to this visit.  1)  Hypertension: patient checks blood pressure daily at home.  Readings have been for the most part <130/80 at rest . Patient is following a reduced salt diet most days and is taking medications as prescribed  2) Breast enlargement: new onset, bilateral, no pain , no nipple discharge, no history of steroid use.  Only noticed it because he started wearing knit shirts to work instead of cotton button downs.  First noticed it several months ago.Marland Kitchen  No change in several months . No new supplements.  No marijuana use.  Has normal libido,  But mild ED;  does not want Viagra.   History Ladislaus has a past medical history of Congenital heart valve abnormality, Dysrhythmia, GERD (gastroesophageal reflux disease), Hypercholesterolemia, and PONV (postoperative nausea and vomiting).   He has a past surgical history that includes Inguinal hernia repair (2006); Tonsillectomy (2001); Anterior cruciate ligament repair (Right, 08/01/2014); TEE without cardioversion (N/A, 08/01/2014); coronary angiogram (10/26/2014); Cardiac catheterization; Bentall procedure (N/A, 11/27/2014); TEE without cardioversion (N/A, 11/27/2014); and Colonoscopy (2010).   His family history includes Arthritis in his mother; Heart disease in his father.He reports that he has never smoked. He has never used smokeless tobacco. He reports current alcohol use  of about 7.0 standard drinks of alcohol per week. He reports that he does not use drugs.  Outpatient Medications Prior to Visit  Medication Sig Dispense Refill  . ALPRAZolam  (XANAX) 0.5 MG tablet TAKE 1 TABLET BY MOUTH EVERY DAY AT BEDTIME AS NEEDED 30 tablet 5  . Ascorbic Acid (VITAMIN C) 1000 MG tablet Take 1,000 mg by mouth daily.    . hydrochlorothiazide (HYDRODIURIL) 12.5 MG tablet TAKE 1 TABLET BY MOUTH  DAILY 90 tablet 1  . levothyroxine (SYNTHROID) 50 MCG tablet TAKE 1 TABLET BY MOUTH  DAILY BEFORE BREAKFAST 90 tablet 1  . losartan (COZAAR) 50 MG tablet TAKE 1 TABLET BY MOUTH  DAILY 90 tablet 1  . metoprolol tartrate (LOPRESSOR) 25 MG tablet TAKE 1 TABLET BY MOUTH TWO  TIMES DAILY 180 tablet 3  . rosuvastatin (CRESTOR) 20 MG tablet TAKE 2 TABLETS BY MOUTH  DAILY 180 tablet 7  . warfarin (COUMADIN) 5 MG tablet TAKE 1/2 TO 1 TABLET BY  MOUTH DAILY AS DIRECTED BY  [facility] 90 tablet 1  . amoxicillin-clavulanate (AUGMENTIN) 875-125 MG tablet Take 1 tablet by mouth 2 (two) times daily. (Patient not taking: Reported on 05/25/2019) 20 tablet 0  . predniSONE (DELTASONE) 10 MG tablet 6 tablets on Day 1 , then reduce by 1 tablet daily until gone (Patient not taking: Reported on 05/25/2019) 21 tablet 0   No facility-administered medications prior to visit.     Review of Systems   Patient denies headache, fevers, malaise, unintentional weight loss, skin rash, eye pain, sinus congestion and sinus pain, sore throat, dysphagia,  hemoptysis , cough, dyspnea, wheezing, chest pain, palpitations, orthopnea, edema, abdominal pain, nausea, melena, diarrhea, constipation, flank pain, dysuria, hematuria, urinary  Frequency, nocturia, numbness, tingling, seizures,  Focal weakness, Loss of consciousness,  Tremor, insomnia, depression, anxiety, and suicidal ideation.      Objective:  BP (!) 148/90 (BP Location: Left Arm, Patient Position: Sitting, Cuff Size: Normal)   Pulse 82   Temp (!) 97.4 F (36.3 C) (Temporal)   Resp 14   Ht 5\' 10"  (1.778 m)   Wt 186 lb 3.2 oz (84.5 kg)   SpO2 98%   BMI 26.72 kg/m   Physical Exam  General appearance: alert, cooperative and  appears stated age Head: Normocephalic, without obvious abnormality, atraumatic Eyes: conjunctivae/corneas clear. PERRL, EOM's intact. Fundi benign. Ears: normal TM's and external ear canals both ears Nose: Nares normal. Septum midline. Mucosa normal. No drainage or sinus tenderness. Throat: lips, mucosa, and tongue normal; teeth and gums normal Neck: no adenopathy, no carotid bruit, no JVD, supple, symmetrical, trachea midline and thyroid not enlarged, symmetric, no tenderness/mass/nodules Lungs: clear to auscultation bilaterally Breasts: mild gynecomastia, , no masses or tenderness Heart: regular rate and rhythm, S1, S2 normal, no murmur, click, rub or gallop Abdomen: soft, non-tender; bowel sounds normal; no masses,  no organomegaly Extremities: extremities normal, atraumatic, no cyanosis or edema Pulses: 2+ and symmetric Skin: Skin color, texture, turgor normal. No rashes or lesions Neurologic: Alert and oriented X 3, normal strength and tone. Normal symmetric reflexes. Normal coordination and gait.     Assessment & Plan:   Problem List Items Addressed This Visit      Unprioritized   Encounter for general adult medical examination with abnormal findings    age appropriate education and counseling updated, referrals for preventative services and immunizations addressed, dietary and smoking counseling addressed, most recent labs reviewed and future labs ordered, including screening labs to investigate gynecomastia .  I have personally reviewed and have noted:  1) the patient's medical and social history 2) The pt's use of alcohol, tobacco, and illicit drugs 3) The patient's current medications and supplements 4) Functional ability including ADL's, fall risk, home safety risk, hearing and visual impairment 5) Diet and physical activities 6) Evidence for depression or mood disorder 7) The patient's height, weight, and BMI have been recorded in the chart  I have made referrals, and  provided counseling and education based on review of the above      Essential hypertension    Well controlled on current regimen. Renal function stable, no changes today.      Dyslipidemia    Managed with Crestor for CAD noted during workup for AVR      Acquired hypothyroidism    Will increased doseof  levothyroxine for goal TSH of 1.0  Given his  inability to lose weight despite diet and exercise,  Repeat tsh is due       Gynecomastia, male - Primary    Etiology unclear .  Screening labs noted       Relevant Orders   Testos,Total,Free and SHBG (Male)   Hormone Panel    Other Visit Diagnoses    Need for shingles vaccine       Relevant Orders   Varicella-zoster vaccine IM (Shingrix) (Completed)      I have discontinued Stephannie Li. Codispoti's amoxicillin-clavulanate and predniSONE. I am also having him maintain his vitamin C, warfarin, rosuvastatin, metoprolol tartrate, levothyroxine, hydrochlorothiazide, losartan, and ALPRAZolam.  No orders of the defined types were placed in this encounter.   Medications Discontinued During This Encounter  Medication Reason  . amoxicillin-clavulanate (AUGMENTIN) 875-125 MG tablet Patient has not taken in last 30 days  . predniSONE (DELTASONE) 10 MG tablet Patient has not taken in last 30 days    Follow-up: No follow-ups on file.   [name], MD

## 2019-05-25 NOTE — Patient Instructions (Addendum)
You received the Shingrx vaccine (#1 of 2)   Your wife should wait at least 8 weeks from her varicella vaccination to receive her vaccination for shingles   The second dose is due after Dec 7 and before May   I will order the screening labs for gynecomastia in  The next 24 hours and let you know when they are ready for collection    Health Maintenance, Male Adopting a healthy lifestyle and getting preventive care are important in promoting health and wellness. Ask your health care provider about:  The right schedule for you to have regular tests and exams.  Things you can do on your own to prevent diseases and keep yourself healthy. What should I know about diet, weight, and exercise? Eat a healthy diet   Eat a diet that includes plenty of vegetables, fruits, low-fat dairy products, and lean protein.  Do not eat a lot of foods that are high in solid fats, added sugars, or sodium. Maintain a healthy weight Body mass index (BMI) is a measurement that can be used to identify possible weight problems. It estimates body fat based on height and weight. Your health care provider can help determine your BMI and help you achieve or maintain a healthy weight. Get regular exercise Get regular exercise. This is one of the most important things you can do for your health. Most adults should:  Exercise for at least 150 minutes each week. The exercise should increase your heart rate and make you sweat (moderate-intensity exercise).  Do strengthening exercises at least twice a week. This is in addition to the moderate-intensity exercise.  Spend less time sitting. Even light physical activity can be beneficial. Watch cholesterol and blood lipids Have your blood tested for lipids and cholesterol at 54 years of age, then have this test every 5 years. You may need to have your cholesterol levels checked more often if:  Your lipid or cholesterol levels are high.  You are older than 54 years of age.   You are at high risk for heart disease. What should I know about cancer screening? Many types of cancers can be detected early and may often be prevented. Depending on your health history and family history, you may need to have cancer screening at various ages. This may include screening for:  Colorectal cancer.  Prostate cancer.  Skin cancer.  Lung cancer. What should I know about heart disease, diabetes, and high blood pressure? Blood pressure and heart disease  High blood pressure causes heart disease and increases the risk of stroke. This is more likely to develop in people who have high blood pressure readings, are of African descent, or are overweight.  Talk with your health care provider about your target blood pressure readings.  Have your blood pressure checked: ? Every 3-5 years if you are 44-21 years of age. ? Every year if you are 87 years old or older.  If you are between the ages of 63 and 24 and are a current or former smoker, ask your health care provider if you should have a one-time screening for abdominal aortic aneurysm (AAA). Diabetes Have regular diabetes screenings. This checks your fasting blood sugar level. Have the screening done:  Once every three years after age 54 if you are at a normal weight and have a low risk for diabetes.  More often and at a younger age if you are overweight or have a high risk for diabetes. What should I know about preventing infection?  Hepatitis B If you have a higher risk for hepatitis B, you should be screened for this virus. Talk with your health care provider to find out if you are at risk for hepatitis B infection. Hepatitis C Blood testing is recommended for:  Everyone born from [birth year] through 1966.  Anyone with known risk factors for hepatitis C. Sexually transmitted infections (STIs)  You should be screened each year for STIs, including gonorrhea and chlamydia, if: ? You are sexually active and are younger than  54 years of age. ? You are older than 54 years of age and your health care provider tells you that you are at risk for this type of infection. ? Your sexual activity has changed since you were last screened, and you are at increased risk for chlamydia or gonorrhea. Ask your health care provider if you are at risk.  Ask your health care provider about whether you are at high risk for HIV. Your health care provider may recommend a prescription medicine to help prevent HIV infection. If you choose to take medicine to prevent HIV, you should first get tested for HIV. You should then be tested every 3 months for as long as you are taking the medicine. Follow these instructions at home: Lifestyle  Do not use any products that contain nicotine or tobacco, such as cigarettes, e-cigarettes, and chewing tobacco. If you need help quitting, ask your health care provider.  Do not use street drugs.  Do not share needles.  Ask your health care provider for help if you need support or information about quitting drugs. Alcohol use  Do not drink alcohol if your health care provider tells you not to drink.  If you drink alcohol: ? Limit how much you have to 0-2 drinks a day. ? Be aware of how much alcohol is in your drink. In the [place] one drink equals one 12 oz bottle of beer (355 mL), one 5 oz glass of wine (148 mL), or one 1 oz glass of hard liquor (44 mL). General instructions  Schedule regular health, dental, and eye exams.  Stay current with your vaccines.  Tell your health care provider if: ? You often feel depressed. ? You have ever been abused or do not feel safe at home. Summary  Adopting a healthy lifestyle and getting preventive care are important in promoting health and wellness.  Follow your health care provider's instructions about healthy diet, exercising, and getting tested or screened for diseases.  Follow your health care provider's instructions on monitoring your cholesterol  and blood pressure. This information is not intended to replace advice given to you by your health care provider. Make sure you discuss any questions you have with your health care provider. Document Released: 01/31/2008 Document Revised: 07/28/2018 Document Reviewed: 07/28/2018 Elsevier Patient Education  2020 [employer].

## 2019-05-26 DIAGNOSIS — N62 Hypertrophy of breast: Secondary | ICD-10-CM

## 2019-05-26 NOTE — Assessment & Plan Note (Signed)
Well controlled on current regimen. Renal function stable, no changes today. 

## 2019-05-26 NOTE — Assessment & Plan Note (Signed)
age appropriate education and counseling updated, referrals for preventative services and immunizations addressed, dietary and smoking counseling addressed, most recent labs reviewed and future labs ordered, including screening labs to investigate gynecomastia .  I have personally reviewed and have noted:  1) the patient's medical and social history 2) The pt's use of alcohol, tobacco, and illicit drugs 3) The patient's current medications and supplements 4) Functional ability including ADL's, fall risk, home safety risk, hearing and visual impairment 5) Diet and physical activities 6) Evidence for depression or mood disorder 7) The patient's height, weight, and BMI have been recorded in the chart  I have made referrals, and provided counseling and education based on review of the above

## 2019-05-26 NOTE — Assessment & Plan Note (Signed)
Etiology unclear .  Screening labs noted

## 2019-05-26 NOTE — Assessment & Plan Note (Signed)
Will increased doseof  levothyroxine for goal TSH of 1.0  Given his  inability to lose weight despite diet and exercise,  Repeat tsh is due

## 2019-05-26 NOTE — Assessment & Plan Note (Signed)
Managed with Crestor for CAD noted during workup for AVR

## 2019-05-31 ENCOUNTER — Other Ambulatory Visit: Payer: Self-pay | Admitting: Internal Medicine

## 2019-06-02 LAB — CBC WITH DIFFERENTIAL/PLATELET
Basophils Absolute: 0.1 10*3/uL (ref 0.0–0.2)
Basos: 1 %
EOS (ABSOLUTE): 0.2 10*3/uL (ref 0.0–0.4)
Eos: 2 %
Hematocrit: 45.8 % (ref 37.5–51.0)
Hemoglobin: 15.7 g/dL (ref 13.0–17.7)
Immature Grans (Abs): 0 10*3/uL (ref 0.0–0.1)
Immature Granulocytes: 0 %
Lymphocytes Absolute: 1.5 10*3/uL (ref 0.7–3.1)
Lymphs: 19 %
MCH: 30.3 pg (ref 26.6–33.0)
MCHC: 34.3 g/dL (ref 31.5–35.7)
MCV: 88 fL (ref 79–97)
Monocytes Absolute: 1.1 10*3/uL — ABNORMAL HIGH (ref 0.1–0.9)
Monocytes: 14 %
Neutrophils Absolute: 5 10*3/uL (ref 1.4–7.0)
Neutrophils: 64 %
Platelets: 216 10*3/uL (ref 150–450)
RBC: 5.19 x10E6/uL (ref 4.14–5.80)
RDW: 13.2 % (ref 11.6–15.4)
WBC: 8 10*3/uL (ref 3.4–10.8)

## 2019-06-02 LAB — COMPREHENSIVE METABOLIC PANEL
ALT: 30 IU/L (ref 0–44)
AST: 28 IU/L (ref 0–40)
Albumin/Globulin Ratio: 1.8 (ref 1.2–2.2)
Albumin: 4.8 g/dL (ref 3.8–4.9)
Alkaline Phosphatase: 63 IU/L (ref 39–117)
BUN/Creatinine Ratio: 12 (ref 9–20)
BUN: 16 mg/dL (ref 6–24)
Bilirubin Total: 0.6 mg/dL (ref 0.0–1.2)
CO2: 23 mmol/L (ref 20–29)
Calcium: 9.9 mg/dL (ref 8.7–10.2)
Chloride: 96 mmol/L (ref 96–106)
Creatinine, Ser: 1.29 mg/dL — ABNORMAL HIGH (ref 0.76–1.27)
GFR calc Af Amer: 72 mL/min/{1.73_m2} (ref >59)
GFR calc non Af Amer: 62 mL/min/{1.73_m2} (ref >59)
Globulin, Total: 2.7 g/dL (ref 1.5–4.5)
Glucose: 90 mg/dL (ref 65–99)
Potassium: 4.3 mmol/L (ref 3.5–5.2)
Sodium: 135 mmol/L (ref 134–144)
Total Protein: 7.5 g/dL (ref 6.0–8.5)

## 2019-06-02 LAB — IRON,TIBC AND FERRITIN PANEL
Ferritin: 162 ng/mL (ref 30–400)
Iron Saturation: 16 % (ref 15–55)
Iron: 63 ug/dL (ref 38–169)
Total Iron Binding Capacity: 403 ug/dL (ref 250–450)
UIBC: 340 ug/dL (ref 111–343)

## 2019-06-02 LAB — TESTOSTERONE, FREE: Testosterone, Free: 5.9 pg/mL — ABNORMAL LOW (ref 7.2–24.0)

## 2019-06-02 LAB — TRANSFERRIN: Transferrin: 346 mg/dL — ABNORMAL HIGH (ref 177–329)

## 2019-06-02 LAB — SEX HORMONE BINDING GLOBULIN: Sex Hormone Binding: 39.1 nmol/L (ref 19.3–76.4)

## 2019-06-02 LAB — PROLACTIN: Prolactin: 9.2 ng/mL (ref 4.0–15.2)

## 2019-06-02 LAB — TESTOSTERONE: Testosterone: 358 ng/dL (ref 264–916)

## 2019-06-02 LAB — FOLLICLE STIMULATING HORMONE: FSH: 3.9 m[IU]/mL (ref 1.5–12.4)

## 2019-06-02 LAB — LUTEINIZING HORMONE: LH: 3.2 m[IU]/mL (ref 1.7–8.6)

## 2019-06-02 LAB — TESTOSTERONE, % FREE: Testosterone-% Free: 1.4 %

## 2019-06-27 LAB — PROTIME-INR: INR: 2.2 — AB (ref 0.9–1.1)

## 2019-06-28 ENCOUNTER — Ambulatory Visit (INDEPENDENT_AMBULATORY_CARE_PROVIDER_SITE_OTHER): Payer: Managed Care, Other (non HMO) | Admitting: Pharmacist

## 2019-06-28 DIAGNOSIS — Z7901 Long term (current) use of anticoagulants: Secondary | ICD-10-CM

## 2019-07-17 ENCOUNTER — Other Ambulatory Visit: Payer: Self-pay | Admitting: Cardiology

## 2019-08-07 ENCOUNTER — Other Ambulatory Visit: Payer: Self-pay | Admitting: Internal Medicine

## 2019-08-08 NOTE — Telephone Encounter (Signed)
Refilled: 02/07/2019 Last OV: 05/25/2019 Next OV: 05/31/2020

## 2019-08-10 LAB — POCT INR: INR: 2.6 (ref 2.0–3.0)

## 2019-08-11 ENCOUNTER — Ambulatory Visit (INDEPENDENT_AMBULATORY_CARE_PROVIDER_SITE_OTHER): Payer: Managed Care, Other (non HMO) | Admitting: Pharmacist Clinician (PhC)/ Clinical Pharmacy Specialist

## 2019-08-11 DIAGNOSIS — Z7901 Long term (current) use of anticoagulants: Secondary | ICD-10-CM

## 2019-08-11 DIAGNOSIS — Z954 Presence of other heart-valve replacement: Secondary | ICD-10-CM | POA: Diagnosis not present

## 2019-08-22 ENCOUNTER — Other Ambulatory Visit: Payer: Self-pay

## 2019-08-22 ENCOUNTER — Ambulatory Visit (HOSPITAL_COMMUNITY): Payer: Managed Care, Other (non HMO) | Attending: Internal Medicine

## 2019-08-22 DIAGNOSIS — Z954 Presence of other heart-valve replacement: Secondary | ICD-10-CM

## 2019-08-26 ENCOUNTER — Ambulatory Visit: Payer: Managed Care, Other (non HMO) | Admitting: Internal Medicine

## 2019-09-04 ENCOUNTER — Other Ambulatory Visit: Payer: Self-pay | Admitting: Internal Medicine

## 2019-09-13 LAB — POCT INR: INR: 2.3 (ref 2.0–3.0)

## 2019-09-14 ENCOUNTER — Ambulatory Visit (INDEPENDENT_AMBULATORY_CARE_PROVIDER_SITE_OTHER): Payer: Managed Care, Other (non HMO) | Admitting: Cardiology

## 2019-09-14 DIAGNOSIS — Z954 Presence of other heart-valve replacement: Secondary | ICD-10-CM | POA: Diagnosis not present

## 2019-09-20 ENCOUNTER — Encounter (INDEPENDENT_AMBULATORY_CARE_PROVIDER_SITE_OTHER): Payer: Self-pay

## 2019-09-21 ENCOUNTER — Ambulatory Visit: Payer: Managed Care, Other (non HMO) | Admitting: Internal Medicine

## 2019-09-21 ENCOUNTER — Other Ambulatory Visit: Payer: Self-pay

## 2019-09-21 ENCOUNTER — Encounter: Payer: Self-pay | Admitting: Internal Medicine

## 2019-09-21 VITALS — BP 142/90 | HR 61 | Temp 96.8°F | Ht 70.0 in | Wt 188.6 lb

## 2019-09-21 DIAGNOSIS — Z954 Presence of other heart-valve replacement: Secondary | ICD-10-CM | POA: Diagnosis not present

## 2019-09-21 DIAGNOSIS — E785 Hyperlipidemia, unspecified: Secondary | ICD-10-CM

## 2019-09-21 DIAGNOSIS — I2510 Atherosclerotic heart disease of native coronary artery without angina pectoris: Secondary | ICD-10-CM | POA: Diagnosis not present

## 2019-09-21 DIAGNOSIS — E039 Hypothyroidism, unspecified: Secondary | ICD-10-CM | POA: Diagnosis not present

## 2019-09-21 LAB — LIPID PANEL
Chol/HDL Ratio: 4 ratio (ref 0.0–5.0)
Cholesterol, Total: 178 mg/dL (ref 100–199)
HDL: 45 mg/dL (ref >39)
LDL Chol Calc (NIH): 103 mg/dL — ABNORMAL HIGH (ref 0–99)
Triglycerides: 174 mg/dL — ABNORMAL HIGH (ref 0–149)
VLDL Cholesterol Cal: 30 mg/dL (ref 5–40)

## 2019-09-21 LAB — TSH+FREE T4
Free T4: 1.52 ng/dL (ref 0.82–1.77)
TSH: 3.12 u[IU]/mL (ref 0.450–4.500)

## 2019-09-21 NOTE — Progress Notes (Signed)
OFFICE NOTE  Chief Complaint:  No complaints  Primary Care Physician: [name], MD  HPI:  Frank [name] is a 54 y.o. male with a history of at least moderate AS (followed by Dr. [name] at the [facility]), presented for ACL repair with Dr. [name]. Preoperatively, he was evaluated by Dr. [name] (cardiac anesthesia) and found to have a loud AS murmur- After some discussion, interoperative TEE was recommended and performed. I personally reviewed the TEE images which do demonstrate severe aortic stenosis, moderate AI and mild to moderate MR (obtained during surgery, on pressor, PPV, etc, but still significant). He was recently told he probably had another 5 years before needing to consider surgery. Mean gradient is already above 40 mmHg. Clinically, he has been asymptomatic (no chest pain, dyspnea, syncope, etc,) but recently he has had more palpitations (PVC's) and was started on b-blocker. He did manage to get through surgery uneventfully.   He returns today in follow-up to the office. He continues to deny any chest pain, worsening shortness of breath, presyncope or syncopal episodes. He has not pushed himself significantly due to his recent knee surgery. He is undergoing rehabilitation and getting stronger. A repeat echocardiogram was just performed as an outpatient. This was initially read by one of my partners and indicated mild to moderate aortic stenosis based on a mean valve gradient in the 20s. However subsequent images during the study do indicate mean gradients greater than 50 from the right upper sternal border using Pedoff probe. I therefore personally reviewed the echocardiogram images and made the appropriate corrections, and the results are as follows:  Study Conclusions  - Left ventricle: The cavity size was normal. Wall thickness was increased in a pattern of mild LVH. Systolic function was normal. The estimated ejection fraction was in the range of 55% to  60%. - Aortic valve: The aortic valve is bicuspid and is heavily calcified with restricted leaflet motion. Peak and mean gradients through the valve are 100 and 59 mm Hg respectively, consistent with severe AS. The calculated AVA is 0.6 cm2. There is moderate, eccentric AI. There was mild regurgitation. - Aorta: Aortic root dimension: 39 mm (ED). - Aortic root: The aortic root is mildly dilated. - Mitral valve: Calcified annulus. Mildly thickened leaflets . There was mild regurgitation. - Left atrium: LA Volume/BSA= 46.4 ml/m2. The atrium is moderately dilated.  Impressions:  - LVEF 60-65%, moderate concentric LVH, bicuspid and severely stenotic aortic valve- AVA 0.6 cm2, peak and mean gradients of 100 mmHg and 59 mmHg, respectively - LVOT diameter measured at 2.4 cm. There is moderate AI. There is mild to moderate MR with restricted anterior leaflet motion secondary to eccentric AI.   Pedoff measurements do indeed show a very high mean gradient of 59 and peak gradient of 100 mmHg. Based on a generous LVOT diameter of 2.4 cm, the calculated aortic valve gradient was 0.6 cm consistent with severe aortic stenosis.  I saw Frank [name] back in the office today. He was recently on a trip and noticed increasing shortness of breath and easy fatigue with exercise which was a new finding for him. He feels that this is symptomatic aortic stenosis and I would agree. I discussed the case further with Dr. Cyndia Bent, the cardiac thoracic surgeon that he's been seeing, and we feel that it's probably the time now to entertain valve replacement. Based on that there is also concern about aortopathy given his bicuspid valve. He underwent a CT scan of  the aorta and coronaries which showed does reveal multivessel coronary artery calcification as well as calcium deposits in the left main coronary. The aortic valve is heavily calcified and restricted in motion. There was at least mild cardiac  hypertrophy and dilatation of the left ventricle. The ascending aorta is dilated measuring 4.3-4.4 cm. There is no evidence for coarctation or enlargement of the thoracic or descending aorta.  Frank [name] returns today for follow-up. He successfully underwent Bentall procedure with hemi-arch replacement using a 28 mm Hemashield graft under deep hypothermic circulatory arrest and Bentall Procedure ( replacement of aortic valve and aortic root with reimplantation of the coronary arteries) using a 21 mm St. Jude mechanical valve graft. Overall he is doing exceedingly well. He continues to do exercise. He is also recovering from recent knee surgery. He denies any chest pain or worsening shortness of breath. His energy levels improved significantly. His median sternotomy is healing well. He did have some postoperative anemia however this seems to have improved and his energy level is better.  I saw Barnabas back in the office today for follow-up. He is doing exceedingly well. He's been active and working and traveling without any restrictions. He denies any chest pain or shortness of breath. He's had a small amount of weight gain however BMI is only 26. He feels better than he has in years he says. Recently we increased his cholesterol medication as his LDL was still not treated to goal. Particle number was just over 1100. He is due for recheck of this next month. He says he does get some mild muscle soreness after exercise that does not resolve quickly which he may attribute to increase in Crestor.  01/16/2016  Frank [name] returns today for follow-up. He reports that he is doing exceedingly well. His last use been excellent for him. He is exercising more regularly. He's had a small amount of weight gain but is less than 5 pounds and is starting to come down. He denies any chest pain or shortness of breath. His blood pressure is slightly higher recently however and he was started on losartan 50 mg daily.  Initial blood pressure today was 130/90 however recheck was 110/80 in the left arm and 112/82 in the right arm. He was concerned about some differential blood pressures in the past however this difference is negligible. In discussion today we realized he had not had an echocardiogram since his valve surgery and we need to establish a baseline valve gradient.  08/22/2016  Frank [name] returns today for follow-up. He says is feeling exceedingly well. He exercises regularly now and has a better energy level. He denies any shortness of breath or chest pain. He's been compliant with his INR checks and is Coumadin levels have been appropriate. Blood pressure appears well controlled. He has excellent cholesterol control with LDL in the 80s on rosuvastatin.   08/28/2017  Frank [name] is doing well and is without complaints. He is pleased with his AVR. He exercises regularly and has gained some muscle mass. He has maintained a therapeutic INR. He had an echo this past Summer which demonstrated a stable aortic valve gradient and normal LV function. BP is well controlled. Recent labs show LDL of 81 on Crestor.  09/02/2018  Frank [name] is seen today in follow-up.  He is doing extremely well.  He says he is not felt this great in years.  He was struggling with some palpitations however is added some supplementary potassium to his diet which is  helped his symptoms.  He has been therapeutic on warfarin which is been stable.  Weight is stable.  Blood pressure is now well controlled.  He denies any chest pain or shortness of breath.  He exercises several times a week.  09/21/2019  Frank [name] returns today for annual follow-up.  He recently underwent an echo which showed normal systolic function, preserved mechanical aortic valve gradient and stable ascending aortic diameters.  Symptomatically he feels well.  He has no new complaints is worsening shortness of breath or chest pain.  He had some recent labs but is not had  reassessment of his lipids or thyroid.  He is on a low-dose of thyroid replacement.  He has been concerned about some recent development of gynecomastia.  He underwent endocrine testing for this which was unremarkable.  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  PMHx:  Past Medical History:  Diagnosis Date  . Congenital heart valve abnormality    bicuspid aortic valve  . Dysrhythmia    PVC's  . GERD (gastroesophageal reflux disease)   . Hypercholesterolemia   . PONV (postoperative nausea and vomiting)    after a tonsillectomy..none since    Past Surgical History:  Procedure Laterality Date  . ANTERIOR CRUCIATE LIGAMENT REPAIR Right 08/01/2014   Procedure: RECONSTRUCTION ANTERIOR CRUCIATE LIGAMENT (ACL) WITH HAMSTRING GRAFT, MENISCAL DEBRIDEMENT AS NEEDED.;  Surgeon: Meredith Pel, MD;  Location: [place];  Service: Orthopedics;  Laterality: Right;  . BENTALL PROCEDURE N/A 11/27/2014   Procedure: BENTALL PROCEDURE;  Surgeon: [name], MD;  Location: [place];  Service: Open Heart Surgery;  Laterality: N/A;  WITH CIRC ARREST  . CARDIAC CATHETERIZATION     10/26/2014  . COLONOSCOPY  2010  . CORONARY ANGIOGRAM  10/26/2014   Procedure: CORONARY ANGIOGRAM;  Surgeon: [name], MD;  Location: [facility] CATH LAB;  Service: Cardiovascular;;  . INGUINAL HERNIA REPAIR  2006   left, Dr. Bary Castilla  . TEE WITHOUT CARDIOVERSION N/A 08/01/2014   Procedure: TRANSESOPHAGEAL ECHOCARDIOGRAM (TEE);  Surgeon: Meredith Pel, MD;  Location: [place];  Service: Orthopedics;  Laterality: N/A;  . TEE WITHOUT CARDIOVERSION N/A 11/27/2014   Procedure: TRANSESOPHAGEAL ECHOCARDIOGRAM (TEE);  Surgeon: [name], MD;  Location: [place];  Service: Open Heart Surgery;  Laterality: N/A;  . TONSILLECTOMY  2001    FAMHx:  Family History  Problem Relation Age of Onset  . Arthritis Mother        psoriatis, Crohn's   . Heart disease  Father        CAD in his early 73s    SOCHx:   reports that he has never smoked. He has never used smokeless tobacco. He reports current alcohol use of about 7.0 standard drinks of alcohol per week. He reports that he does not use drugs.  ALLERGIES:  No Known Allergies  ROS: Pertinent items noted in HPI and remainder of comprehensive ROS otherwise negative.  HOME MEDS: Current Outpatient Medications on File Prior to Visit  Medication Sig Dispense Refill  . ALPRAZolam (XANAX) 0.5 MG tablet TAKE 1 TABLET BY MOUTH EVERY DAY AT BEDTIME AS NEEDED 30 tablet 5  . Ascorbic Acid (VITAMIN C) 1000 MG tablet Take 1,000 mg by mouth daily.    . hydrochlorothiazide (HYDRODIURIL) 12.5 MG tablet TAKE 1 TABLET BY MOUTH  DAILY 90 tablet 3  . levothyroxine (SYNTHROID) 50 MCG tablet TAKE 1 TABLET BY MOUTH  DAILY BEFORE BREAKFAST 90 tablet 3  . losartan (COZAAR) 50 MG tablet TAKE 1 TABLET BY MOUTH  DAILY 90 tablet 3  . metoprolol tartrate (LOPRESSOR) 25 MG tablet TAKE 1 TABLET BY MOUTH TWO  TIMES DAILY 180 tablet 3  . niacin 500 MG tablet Take by mouth.    . rosuvastatin (CRESTOR) 20 MG tablet TAKE 2 TABLETS BY MOUTH  DAILY 180 tablet 7  . warfarin (COUMADIN) 5 MG tablet TAKE 1/2 TO 1 TABLET BY  MOUTH DAILY AS DIRECTED BY  COUMADIN CLINIC 90 tablet 1   No current facility-administered medications on file prior to visit.    LABS/IMAGING: No results found for this or any previous visit (from the past 48 hour(s)). No results found.  WEIGHTS: Wt Readings from Last 3 Encounters:  09/21/19 188 lb 9.6 oz (85.5 kg)  05/25/19 186 lb 3.2 oz (84.5 kg)  04/20/19 187 lb (84.8 kg)    VITALS: BP (!) 142/90   Pulse 61   Temp (!) 96.8 F (36 C)   Ht 5\' 10"  (1.778 m)   Wt 188 lb 9.6 oz (85.5 kg)   SpO2 99%   BMI 27.06 kg/m   EXAM: General appearance: alert and no distress Neck: no carotid bruit and no JVD Lungs: clear to auscultation bilaterally Heart: regular rate and rhythm, S1, S2 normal and Sharp  mechanical valve sounds Abdomen: soft, non-tender; bowel sounds normal; no masses,  no organomegaly Extremities: extremities normal, atraumatic, no cyanosis or edema Pulses: 2+ and symmetric Skin: Skin color, texture, turgor normal. No rashes or lesions Neurologic: Grossly normal I: Pleasant  EKG: Sinus rhythm with PVC at 61, RBBB-personally reviewed  ASSESSMENT: 1. Status post aVR with the mechanical valve (11/27/2014) 2. 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 Mild nonobstructive coronary disease 4. Dyslipidemia 5. History of bicuspid aortic valve with aortopathy  PLAN: 1.   Frank [name] is doing well with stable mechanical aortic valve now almost 5 years out from his procedure.  He had hemiarch repair for aneurysm and the aortic dimensions are stable.  He is due for repeat assessment of his dyslipidemia.  He is also on low-dose thyroid medicine which will need to be checked at least once a year.  We will order that today.  Otherwise, he is doing well symptomatically.  Plan follow-up annually or sooner as necessary.  [name], MD, [facility], [place] Director of the Advanced Lipid Disorders &  Cardiovascular Risk Reduction Clinic Diplomate of the American Board of Clinical Lipidology Attending Cardiologist  Direct Dial: [phone]  Fax: [phone]  Website:  [email or web address] 09/21/2019, 8:07 AM

## 2019-09-21 NOTE — Patient Instructions (Signed)
Medication Instructions:  Your physician recommends that you continue on your current medications as directed. Please refer to the Current Medication list given to you today.  *If you need a refill on your cardiac medications before your next appointment, please call your pharmacy*  Lab Work: LIPID PANEL, TSH, Free T4 today If you have labs (blood work) drawn today and your tests are completely normal, you will receive your results only by: Marland Kitchen MyChart Message (if you have MyChart) OR . A paper copy in the mail If you have any lab test that is abnormal or we need to change your treatment, we will call you to review the results.  Testing/Procedures: NONE  Follow-Up: At [facility], you and your health needs are our priority.  As part of our continuing mission to provide you with exceptional heart care, we have created designated Provider Care Teams.  These Care Teams include your primary Cardiologist (physician) and Advanced Practice Providers (APPs -  Physician Assistants and Nurse Practitioners) who all work together to provide you with the care you need, when you need it.  Your next appointment:   12 month(s)  The format for your next appointment:   In Person  Provider:   You may see [name], MD or one of the following Advanced Practice Providers on your designated Care Team:    Almyra Deforest, PA-C  Fabian Sharp, PA-C or   Roby Lofts, [place]   Other Instructions

## 2019-10-06 ENCOUNTER — Other Ambulatory Visit: Payer: Self-pay | Admitting: Internal Medicine

## 2019-10-18 ENCOUNTER — Ambulatory Visit: Payer: Managed Care, Other (non HMO)

## 2019-10-18 ENCOUNTER — Other Ambulatory Visit: Payer: Self-pay

## 2019-10-18 DIAGNOSIS — Z0001 Encounter for general adult medical examination with abnormal findings: Secondary | ICD-10-CM

## 2019-10-18 NOTE — Progress Notes (Signed)
Dr. Derrel Nip can you please sign off on this nurse visit. Pt was here for second shingrix vaccine and the chart was scheduled wrong, it will not allow me to sign off on the visit.     I have reviewed the above information and agree with above.   Deborra Medina, MD

## 2019-10-24 LAB — POCT INR: INR: 2.2 (ref 2.0–3.0)

## 2019-10-24 LAB — PROTIME-INR: INR: 2.2 — AB (ref 0.9–1.1)

## 2019-10-25 ENCOUNTER — Ambulatory Visit (INDEPENDENT_AMBULATORY_CARE_PROVIDER_SITE_OTHER): Payer: Managed Care, Other (non HMO) | Admitting: Pharmacist Clinician (PhC)/ Clinical Pharmacy Specialist

## 2019-10-25 DIAGNOSIS — Z5181 Encounter for therapeutic drug level monitoring: Secondary | ICD-10-CM | POA: Diagnosis not present

## 2019-10-25 DIAGNOSIS — Z954 Presence of other heart-valve replacement: Secondary | ICD-10-CM

## 2019-12-09 LAB — PROTIME-INR: INR: 2.6 — AB (ref 0.9–1.1)

## 2019-12-12 ENCOUNTER — Ambulatory Visit (INDEPENDENT_AMBULATORY_CARE_PROVIDER_SITE_OTHER): Payer: Managed Care, Other (non HMO) | Admitting: Pharmacist Clinician (PhC)/ Clinical Pharmacy Specialist

## 2019-12-12 DIAGNOSIS — Z7901 Long term (current) use of anticoagulants: Secondary | ICD-10-CM

## 2019-12-12 DIAGNOSIS — Z954 Presence of other heart-valve replacement: Secondary | ICD-10-CM

## 2020-01-23 LAB — PROTIME-INR: INR: 2 — AB (ref 0.9–1.1)

## 2020-01-24 ENCOUNTER — Ambulatory Visit (INDEPENDENT_AMBULATORY_CARE_PROVIDER_SITE_OTHER): Payer: Managed Care, Other (non HMO) | Admitting: Pharmacist

## 2020-01-24 DIAGNOSIS — Z954 Presence of other heart-valve replacement: Secondary | ICD-10-CM

## 2020-01-24 DIAGNOSIS — Z7901 Long term (current) use of anticoagulants: Secondary | ICD-10-CM

## 2020-02-21 ENCOUNTER — Other Ambulatory Visit: Payer: Self-pay | Admitting: Cardiology

## 2020-03-08 LAB — LIPID PANEL
Cholesterol: 164 (ref 0–200)
HDL: 43 (ref 35–70)
LDL Cholesterol: 95
Triglycerides: 145 (ref 40–160)

## 2020-03-08 LAB — PROTIME-INR: INR: 2.6 — AB (ref 0.9–1.1)

## 2020-03-08 LAB — HEMOGLOBIN A1C: Hemoglobin A1C: 5.9

## 2020-03-08 LAB — BASIC METABOLIC PANEL: Glucose: 86

## 2020-03-09 ENCOUNTER — Ambulatory Visit (INDEPENDENT_AMBULATORY_CARE_PROVIDER_SITE_OTHER): Payer: Managed Care, Other (non HMO) | Admitting: Pharmacist

## 2020-03-09 DIAGNOSIS — Z954 Presence of other heart-valve replacement: Secondary | ICD-10-CM

## 2020-03-09 DIAGNOSIS — Z7901 Long term (current) use of anticoagulants: Secondary | ICD-10-CM

## 2020-03-13 ENCOUNTER — Other Ambulatory Visit: Payer: Self-pay | Admitting: Internal Medicine

## 2020-03-13 NOTE — Telephone Encounter (Signed)
Refill request for, xanax last seen 05-24-20, last filled 08-08-19.  Please advise.

## 2020-04-19 LAB — PROTIME-INR: INR: 2.3 — AB (ref 0.9–1.1)

## 2020-04-20 ENCOUNTER — Ambulatory Visit (INDEPENDENT_AMBULATORY_CARE_PROVIDER_SITE_OTHER): Payer: Managed Care, Other (non HMO) | Admitting: Pharmacist

## 2020-04-20 DIAGNOSIS — Z7901 Long term (current) use of anticoagulants: Secondary | ICD-10-CM | POA: Diagnosis not present

## 2020-04-20 DIAGNOSIS — Z954 Presence of other heart-valve replacement: Secondary | ICD-10-CM | POA: Diagnosis not present

## 2020-04-25 ENCOUNTER — Other Ambulatory Visit: Payer: Self-pay | Admitting: Internal Medicine

## 2020-05-06 ENCOUNTER — Other Ambulatory Visit: Payer: Self-pay | Admitting: Internal Medicine

## 2020-05-11 ENCOUNTER — Other Ambulatory Visit: Payer: Self-pay | Admitting: Internal Medicine

## 2020-05-15 ENCOUNTER — Encounter: Payer: Self-pay | Admitting: Family Medicine

## 2020-05-15 ENCOUNTER — Ambulatory Visit: Payer: Managed Care, Other (non HMO) | Admitting: Family Medicine

## 2020-05-15 ENCOUNTER — Other Ambulatory Visit: Payer: Self-pay

## 2020-05-15 ENCOUNTER — Ambulatory Visit: Payer: Self-pay

## 2020-05-15 DIAGNOSIS — R011 Cardiac murmur, unspecified: Secondary | ICD-10-CM | POA: Insufficient documentation

## 2020-05-15 DIAGNOSIS — M79662 Pain in left lower leg: Secondary | ICD-10-CM

## 2020-05-15 MED ORDER — HYDROCODONE-ACETAMINOPHEN 5-325 MG PO TABS: 1.0000 | ORAL_TABLET | Freq: Every evening | ORAL | 0 refills | Status: DC | PRN

## 2020-05-15 NOTE — Progress Notes (Signed)
Office Visit Note   Patient: Frank [name]           Date of Birth: 05-May-1966           MRN: [id number] Visit Date: 05/15/2020 Requested by: [name], MD [address] [place],  [place] [address] PCP: [name], MD  Subjective: Chief Complaint  Patient presents with  . Left Lower Leg - Pain    Pain and swelling in the calf since 05/13/20-standing outside of the car, leaned & reached (feet flat on groungd) over a suitcase in the backseat of the car - felt like someone had hit him in the leg with a bat. NWB w/crutches. ACE wrap. Ice.    HPI: 54yo M (with PMHx mechanical valve replacement, on coumadin) presenting to clinic with concerns of severe, acute Left calf pain, swelling. [place] and [place] into his car to grab a sweatshirt. As he leaned over, he felt a sudden, explosive pop in the back of his left leg "Like someone had come up behind me with a baseball bat." He states that he cried out, and immediately fell to the ground. States his pain was radiating throughout his calf initially, however it has since localized to the more proximal medial aspect. He has been taking tylenol for pain, and bought himself crutches, as walking is very painful. He says that he is typically very active, walking daily, scuba diving, and that he is scared this will slow him down. No numbness/tingling in the leg. No significant pain with bending the knee, though does endorse pain with plantarflexion of his left foot.               ROS: States he is compliant with coumadin monitoring, and has been stable on his dose for some time. Last INR within ideal range.  All other systems were reviewed and are negative.  Objective: Vital Signs: There were no vitals taken for this visit.  Physical Exam:  General:  Alert and oriented, in no acute distress. Pulm:  Breathing unlabored. Psy:  Normal mood, congruent affect. Skin:  Bruising throughout  the proximal/medial aspect of his left calf. Overlying skin intact.   LEFT CALF:  Walks with crutch assistance.  Significant swelling throughout left lower leg, with overlying ecchymosis.  Marked tenderness to palpation throughout medial aspect of calf, extending proximal to the knee until approximately mid-calf level. No tenderness with palpation along achilles tendon.  Achilles tendon without palpable defect. Positive foot plantarflexion with calf compression.  Distal sensation intact, with brisk capillary refill.    Imaging: US Guided Needle Placement  Result Date: 05/15/2020 Limited diagnostic ultrasound of left calf reveals a defect in the medial head of the gastrocnemius with hematoma.  Soleus looks intact, and gastroc tendon looks intact.   Assessment & Plan: 54yo M presenting to clinic with acute left calf pain. Examination and US findings suggestive of tear within medial head of gastrocnemius, though achilles and soleus appear intact.  -Discussed that this should be amenable to conservative treatment without the need for surgical intervention -RICE therapy, with Tylenol for pain during the day. Will provide Norco for pain keeping him awake at night -Early physical therapy referral -Strict return precautions discussed.      Procedures: No procedures performed  No notes on file     PMFS History: Patient Active Problem List   Diagnosis Date Noted  . Heart murmur 05/15/2020  . Gynecomastia,  male 05/25/2019  . Acquired hypothyroidism 11/12/2017  . Long term (current) use of anticoagulants 08/29/2017  . Rectal bleeding 06/11/2017  . Erectile dysfunction 05/16/2017  . Vertigo, labyrinthine 11/16/2015  . CAD in native artery 07/02/2015  . Dyslipidemia 07/02/2015  . Overweight (BMI 25.0-29.9) 05/06/2015  . Essential hypertension 05/04/2015  . S/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 84/66/5993  . Encounter for therapeutic drug monitoring 12/04/2014  . S/P  ascending aortic replacement 11/27/2014  . S/P ACL repair 05/04/2014  . SVT (supraventricular tachycardia) ([place]) 05/01/2014  . Palpitations 05/01/2014  . Encounter for general adult medical examination with abnormal findings 04/22/2013  . Cyst, epididymis 04/22/2013  . Hx of bicuspid aortic valve 04/20/2013  . Screening for prostate cancer 04/17/2012  . Insomnia 04/15/2012  . Esophageal reflux 04/09/2011  . Hx of inguinal hernia surgery 04/09/2011  . Other malaise and fatigue 04/09/2011  . Other and unspecified hyperlipidemia 04/09/2011   Past Medical History:  Diagnosis Date  . Congenital heart valve abnormality    bicuspid aortic valve  . Dysrhythmia    PVC's  . GERD (gastroesophageal reflux disease)   . Hypercholesterolemia   . PONV (postoperative nausea and vomiting)    after a tonsillectomy..none since    Family History  Problem Relation Age of Onset  . Arthritis Mother        psoriatis, Crohn's   . Heart disease Father        CAD in his early 37s    Past Surgical History:  Procedure Laterality Date  . ANTERIOR CRUCIATE LIGAMENT REPAIR Right 08/01/2014   Procedure: RECONSTRUCTION ANTERIOR CRUCIATE LIGAMENT (ACL) WITH HAMSTRING GRAFT, MENISCAL DEBRIDEMENT AS NEEDED.;  Surgeon: Meredith Pel, MD;  Location: [place];  Service: Orthopedics;  Laterality: Right;  . BENTALL PROCEDURE N/A 11/27/2014   Procedure: BENTALL PROCEDURE;  Surgeon: [name], MD;  Location: [place];  Service: Open Heart Surgery;  Laterality: N/A;  WITH CIRC ARREST  . CARDIAC CATHETERIZATION     10/26/2014  . COLONOSCOPY  2010  . CORONARY ANGIOGRAM  10/26/2014   Procedure: CORONARY ANGIOGRAM;  Surgeon: [name], MD;  Location: [facility] CATH LAB;  Service: Cardiovascular;;  . INGUINAL HERNIA REPAIR  2006   left, Dr. Bary Castilla  . TEE WITHOUT CARDIOVERSION N/A 08/01/2014   Procedure: TRANSESOPHAGEAL ECHOCARDIOGRAM (TEE);  Surgeon: Meredith Pel, MD;  Location: [place];  Service: Orthopedics;   Laterality: N/A;  . TEE WITHOUT CARDIOVERSION N/A 11/27/2014   Procedure: TRANSESOPHAGEAL ECHOCARDIOGRAM (TEE);  Surgeon: [name], MD;  Location: [place];  Service: Open Heart Surgery;  Laterality: N/A;  . TONSILLECTOMY  2001   Social History   Occupational History  . Occupation: Oncologist: [name]    Comment: full time  . Occupation: Social research officer, government  Tobacco Use  . Smoking status: Never Smoker  . Smokeless tobacco: Never Used  Substance and Sexual Activity  . Alcohol use: Yes    Alcohol/week: 7.0 standard drinks    Types: 7 Glasses of wine per week    Comment: occasional, drink 1 beer per wk  . Drug use: No  . Sexual activity: Yes

## 2020-05-15 NOTE — Progress Notes (Signed)
I saw and examined the patient with Dr. Elouise Munroe and agree with assessment and plan as outlined.    Left medial gastroc partial tear/strain.  Will use heel lift, WBAT.  Referral to PT.  Anticipate 6-8 weeks healing time.  Return if not improving.  Ultimately he'll work on flexibility and strength to prevent recurrence.

## 2020-05-18 ENCOUNTER — Ambulatory Visit: Payer: Managed Care, Other (non HMO) | Admitting: Nurse Practitioner

## 2020-05-21 ENCOUNTER — Encounter: Payer: Self-pay | Admitting: Family Medicine

## 2020-05-21 DIAGNOSIS — M79662 Pain in left lower leg: Secondary | ICD-10-CM

## 2020-05-22 ENCOUNTER — Encounter: Payer: Self-pay | Admitting: Family Medicine

## 2020-05-22 ENCOUNTER — Ambulatory Visit: Payer: Self-pay

## 2020-05-22 ENCOUNTER — Ambulatory Visit: Payer: Managed Care, Other (non HMO) | Admitting: Family Medicine

## 2020-05-22 ENCOUNTER — Other Ambulatory Visit: Payer: Self-pay

## 2020-05-22 ENCOUNTER — Telehealth (INDEPENDENT_AMBULATORY_CARE_PROVIDER_SITE_OTHER): Payer: Managed Care, Other (non HMO) | Admitting: Family Medicine

## 2020-05-22 DIAGNOSIS — M79662 Pain in left lower leg: Secondary | ICD-10-CM

## 2020-05-22 DIAGNOSIS — J0100 Acute maxillary sinusitis, unspecified: Secondary | ICD-10-CM | POA: Insufficient documentation

## 2020-05-22 MED ORDER — AMOXICILLIN-POT CLAVULANATE 875-125 MG PO TABS: 1.0000 | ORAL_TABLET | Freq: Two times a day (BID) | ORAL | 0 refills | Status: DC

## 2020-05-22 MED ORDER — BENZONATATE 200 MG PO CAPS: 200.0000 mg | ORAL_CAPSULE | Freq: Two times a day (BID) | ORAL | 0 refills | Status: DC | PRN

## 2020-05-22 NOTE — Progress Notes (Signed)
Virtual Visit via video Note  This visit type was conducted due to national recommendations for restrictions regarding the COVID-19 pandemic (e.g. social distancing).  This format is felt to be most appropriate for this patient at this time.  All issues noted in this document were discussed and addressed.  No physical exam was performed (except for noted visual exam findings with Video Visits).   I connected with Frank [name] today at  1:15 PM EDT by a video enabled telemedicine application or telephone and verified that I am speaking with the correct person using two identifiers. Location patient: home Location provider: home office Persons participating in the virtual visit: patient, provider  I discussed the limitations, risks, security and privacy concerns of performing an evaluation and management service by telephone and the availability of in person appointments. I also discussed with the patient that there may be a patient responsible charge related to this service. The patient expressed understanding and agreed to proceed.  Reason for visit: same day visit  HPI: Sinusitis: Patient notes onset of symptoms 7 to 8 days ago.  He reports a negative Covid PCR test through [facility] on 9/30.  Reports significant maxillary sinus pressure and congestion.  Notes cough is uncontrollable.  No fever, shortness of breath, or taste or smell disturbances.  No COVID-19 exposures.  He is fully vaccinated against COVID-19.  He has been using Mucinex DM with little benefit for his cough.  He is blowing dark green mucus out of his nose.  He has been taking Vicodin for a calf muscle issue which he is seeing orthopedics for and notes the Vicodin did not help with his cough when he was taking it at night.   ROS: See pertinent positives and negatives per HPI.  Past Medical History:  Diagnosis Date  . Congenital heart valve abnormality    bicuspid aortic valve  . Dysrhythmia    PVC's  . GERD  (gastroesophageal reflux disease)   . Hypercholesterolemia   . PONV (postoperative nausea and vomiting)    after a tonsillectomy..none since    Past Surgical History:  Procedure Laterality Date  . ANTERIOR CRUCIATE LIGAMENT REPAIR Right 08/01/2014   Procedure: RECONSTRUCTION ANTERIOR CRUCIATE LIGAMENT (ACL) WITH HAMSTRING GRAFT, MENISCAL DEBRIDEMENT AS NEEDED.;  Surgeon: Meredith Pel, MD;  Location: [place];  Service: Orthopedics;  Laterality: Right;  . BENTALL PROCEDURE N/A 11/27/2014   Procedure: BENTALL PROCEDURE;  Surgeon: [name], MD;  Location: [place];  Service: Open Heart Surgery;  Laterality: N/A;  WITH CIRC ARREST  . CARDIAC CATHETERIZATION     10/26/2014  . COLONOSCOPY  2010  . CORONARY ANGIOGRAM  10/26/2014   Procedure: CORONARY ANGIOGRAM;  Surgeon: [name], MD;  Location: [facility] CATH LAB;  Service: Cardiovascular;;  . INGUINAL HERNIA REPAIR  2006   left, Dr. Bary Castilla  . TEE WITHOUT CARDIOVERSION N/A 08/01/2014   Procedure: TRANSESOPHAGEAL ECHOCARDIOGRAM (TEE);  Surgeon: Meredith Pel, MD;  Location: [place];  Service: Orthopedics;  Laterality: N/A;  . TEE WITHOUT CARDIOVERSION N/A 11/27/2014   Procedure: TRANSESOPHAGEAL ECHOCARDIOGRAM (TEE);  Surgeon: [name], MD;  Location: [place];  Service: Open Heart Surgery;  Laterality: N/A;  . TONSILLECTOMY  2001    Family History  Problem Relation Age of Onset  . Arthritis Mother        psoriatis, Crohn's   . Heart disease Father        CAD in his early 58s    SOCIAL HX:  Non-smoker   Current Outpatient Medications:  .  ALPRAZolam (XANAX) 0.5 MG tablet, TAKE 1 TABLET BY MOUTH EVERY DAY AT BEDTIME AS NEEDED, Disp: 30 tablet, Rfl: 5 .  Ascorbic Acid (VITAMIN C) 1000 MG tablet, Take 1,000 mg by mouth daily., Disp: , Rfl:  .  hydrochlorothiazide (HYDRODIURIL) 12.5 MG tablet, TAKE 1 TABLET BY MOUTH  DAILY, Disp: 90 tablet, Rfl: 3 .  HYDROcodone-acetaminophen (NORCO/VICODIN) 5-325 MG tablet, Take 1-2 tablets by  mouth at bedtime as needed for moderate pain., Disp: 14 tablet, Rfl: 0 .  levothyroxine (SYNTHROID) 50 MCG tablet, TAKE 1 TABLET BY MOUTH  DAILY BEFORE BREAKFAST, Disp: 90 tablet, Rfl: 3 .  losartan (COZAAR) 50 MG tablet, TAKE 1 TABLET BY MOUTH  DAILY, Disp: 90 tablet, Rfl: 3 .  metoprolol tartrate (LOPRESSOR) 25 MG tablet, TAKE 1 TABLET BY MOUTH  TWICE DAILY, Disp: 180 tablet, Rfl: 3 .  rosuvastatin (CRESTOR) 20 MG tablet, TAKE 2 TABLETS BY MOUTH  DAILY, Disp: 180 tablet, Rfl: 7 .  warfarin (COUMADIN) 5 MG tablet, TAKE 1/2 TO 1 TABLET BY  MOUTH DAILY AS DIRECTED BY  C[facility]MADIN CLINIC, Disp: 90 tablet, Rfl: 1 .  amoxicillin-clavulanate (AUGMENTIN) 875-125 MG tablet, Take 1 tablet by mouth 2 (two) times daily., Disp: 14 tablet, Rfl: 0 .  benzonatate (TESSALON) 200 MG capsule, Take 1 capsule (200 mg total) by mouth 2 (two) times daily as needed for cough., Disp: 20 capsule, Rfl: 0  EXAM:  VITALS per patient if applicable:  GENERAL: alert, oriented, appears well and in no acute distress  HEENT: atraumatic, conjunttiva clear, no obvious abnormalities on inspection of external nose and ears  NECK: normal movements of the head and neck  LUNGS: on inspection no signs of respiratory distress, breathing rate appears normal, no obvious gross SOB, gasping or wheezing  CV: no obvious cyanosis  MS: moves all visible extremities without noticeable abnormality  PSYCH/NEURO: pleasant and cooperative, no obvious depression or anxiety, speech and thought processing grossly intact  ASSESSMENT AND PLAN:  Discussed the following assessment and plan:  Sinusitis, acute maxillary Symptoms and duration are consistent with sinusitis.  He had a negative Covid PCR test per his report.  We will treat with Augmentin.  Discussed he will likely notice some improvement within 3 to 4 days.  If not improving he will let us know.  Tessalon for cough.   No orders of the defined types were placed in this  encounter.   Meds ordered this encounter  Medications  . amoxicillin-clavulanate (AUGMENTIN) 875-125 MG tablet    Sig: Take 1 tablet by mouth 2 (two) times daily.    Dispense:  14 tablet    Refill:  0  . benzonatate (TESSALON) 200 MG capsule    Sig: Take 1 capsule (200 mg total) by mouth 2 (two) times daily as needed for cough.    Dispense:  20 capsule    Refill:  0     I discussed the assessment and treatment plan with the patient. The patient was provided an opportunity to ask questions and all were answered. The patient agreed with the plan and demonstrated an understanding of the instructions.   The patient was advised to call back or seek an in-person evaluation if the symptoms worsen or if the condition fails to improve as anticipated.   Tommi Rumps, MD

## 2020-05-22 NOTE — Progress Notes (Signed)
Office Visit Note   Patient: Frank [name]           Date of Birth: 1965-08-27           MRN: [id number] Visit Date: 05/22/2020 Requested by: [name], MD [place],  [place] [id number] PCP: [name], MD  Subjective: Chief Complaint  Patient presents with  . Left Lower Leg - Pain, Follow-up    Still having much swelling in the calf, with increased pain behind the knee. Difficulty flexing the knee.     HPI: He is here with persistent left knee and calf pain.  Is been about 8 days since his injury.  He is having more pain right behind the knee.  He is using an Ace wrap for comfort and crutches.              ROS:   All other systems were reviewed and are negative.  Objective: Vital Signs: There were no vitals taken for this visit.  Physical Exam:  General:  Alert and oriented, in no acute distress. Pulm:  Breathing unlabored. Psy:  Normal mood, congruent affect. Skin: There is ecchymosis at the distal hamstring, posterior knee and proximal calf. Left knee: He remains very tender to palpation of the medial head of the gastrocnemius.  He has pain with full extension of the knee and with flexion against resistance.  Imaging: US Guided Needle Placement - No Linked Charges  Result Date: 05/22/2020 Limited diagnostic ultrasound of the left posterior knee reveals significant tearing of the medial head of the gastrocnemius.  I am unable to fully visualize the gastrocnemius tendon in the posterior knee.   Assessment & Plan: 1.  Persistent left posterior knee and calf pain, concerning for rupture of the medial gastrocnemius tendon - STAT MRI to further evaluate followed by surgical consult if indicated.  If tendon is intact, then continue with physical therapy and conservative management.     Procedures: No procedures performed  No notes on file     PMFS History: Patient Active Problem List   Diagnosis Date Noted  . Sinusitis, acute  maxillary 05/22/2020  . Heart murmur 05/15/2020  . Gynecomastia, male 05/25/2019  . Acquired hypothyroidism 11/12/2017  . Long term (current) use of anticoagulants 08/29/2017  . Rectal bleeding 06/11/2017  . Erectile dysfunction 05/16/2017  . Vertigo, labyrinthine 11/16/2015  . CAD in native artery 07/02/2015  . Dyslipidemia 07/02/2015  . Overweight (BMI 25.0-29.9) 05/06/2015  . Essential hypertension 05/04/2015  . S/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 21/30/8657  . Encounter for therapeutic drug monitoring 12/04/2014  . S/P ascending aortic replacement 11/27/2014  . S/P ACL repair 05/04/2014  . SVT (supraventricular tachycardia) ([place]) 05/01/2014  . Palpitations 05/01/2014  . Encounter for general adult medical examination with abnormal findings 04/22/2013  . Cyst, epididymis 04/22/2013  . Hx of bicuspid aortic valve 04/20/2013  . Screening for prostate cancer 04/17/2012  . Insomnia 04/15/2012  . Esophageal reflux 04/09/2011  . Hx of inguinal hernia surgery 04/09/2011  . Other malaise and fatigue 04/09/2011  . Other and unspecified hyperlipidemia 04/09/2011   Past Medical History:  Diagnosis Date  . Congenital heart valve abnormality    bicuspid aortic valve  . Dysrhythmia    PVC's  . GERD (gastroesophageal reflux disease)   . Hypercholesterolemia   . PONV (postoperative nausea and vomiting)    after a tonsillectomy..none since    Family History  Problem Relation Age of Onset  .  Arthritis Mother        psoriatis, Crohn's   . Heart disease Father        CAD in his early 104s    Past Surgical History:  Procedure Laterality Date  . ANTERIOR CRUCIATE LIGAMENT REPAIR Right 08/01/2014   Procedure: RECONSTRUCTION ANTERIOR CRUCIATE LIGAMENT (ACL) WITH HAMSTRING GRAFT, MENISCAL DEBRIDEMENT AS NEEDED.;  Surgeon: Meredith Pel, MD;  Location: [place];  Service: Orthopedics;  Laterality: Right;  . BENTALL PROCEDURE N/A 11/27/2014   Procedure: BENTALL PROCEDURE;   Surgeon: [name], MD;  Location: [place];  Service: Open Heart Surgery;  Laterality: N/A;  WITH CIRC ARREST  . CARDIAC CATHETERIZATION     10/26/2014  . COLONOSCOPY  2010  . CORONARY ANGIOGRAM  10/26/2014   Procedure: CORONARY ANGIOGRAM;  Surgeon: [name], MD;  Location: [facility] C[facility] LAB;  Service: Cardiovascular;;  . INGUINAL HERNIA REPAIR  2006   left, Dr. Bary Castilla  . TEE WITHOUT CARDIOVERSION N/A 08/01/2014   Procedure: TRANSESOPHAGEAL ECHOCARDIOGRAM (TEE);  Surgeon: Meredith Pel, MD;  Location: [place];  Service: Orthopedics;  Laterality: N/A;  . TEE WITHOUT CARDIOVERSION N/A 11/27/2014   Procedure: TRANSESOPHAGEAL ECHOCARDIOGRAM (TEE);  Surgeon: [name], MD;  Location: [place];  Service: Open Heart Surgery;  Laterality: N/A;  . TONSILLECTOMY  2001   Social History   Occupational History  . Occupation: Oncologist: [employer]    Comment: full time  . Occupation: Social research officer, government  Tobacco Use  . Smoking status: Never Smoker  . Smokeless tobacco: Never Used  Substance and Sexual Activity  . Alcohol use: Yes    Alcohol/week: 7.0 standard drinks    Types: 7 Glasses of wine per week    Comment: occasional, drink 1 beer per wk  . Drug use: No  . Sexual activity: Yes

## 2020-05-22 NOTE — Assessment & Plan Note (Signed)
Symptoms and duration are consistent with sinusitis.  He had a negative Covid PCR test per his report.  We will treat with Augmentin.  Discussed he will likely notice some improvement within 3 to 4 days.  If not improving he will let us know.  Tessalon for cough.

## 2020-05-25 ENCOUNTER — Encounter: Payer: Self-pay | Admitting: Family Medicine

## 2020-05-25 ENCOUNTER — Telehealth: Payer: Self-pay

## 2020-05-25 NOTE — Telephone Encounter (Signed)
Do you know anything about this? The patient also sent a MyChart message to Dr. Junius Roads about this today.

## 2020-05-25 NOTE — Telephone Encounter (Signed)
Patient wife called in saying patient was supposed to get mri done at [facility] imaging , [facility] cancelled patient appt because they are saying they did not receive approval or auth for mri for patient .

## 2020-05-26 ENCOUNTER — Other Ambulatory Visit: Payer: Managed Care, Other (non HMO)

## 2020-05-29 ENCOUNTER — Other Ambulatory Visit: Payer: Self-pay

## 2020-05-29 ENCOUNTER — Ambulatory Visit (INDEPENDENT_AMBULATORY_CARE_PROVIDER_SITE_OTHER): Payer: Managed Care, Other (non HMO)

## 2020-05-29 DIAGNOSIS — M79662 Pain in left lower leg: Secondary | ICD-10-CM

## 2020-05-29 NOTE — Telephone Encounter (Signed)
Pt insurance is pending xray of Left knee, pt is coming in today to get the xray done. Once results are in I will send the xray to [facility].

## 2020-05-30 NOTE — Telephone Encounter (Signed)
Please advise 

## 2020-05-30 NOTE — Progress Notes (Signed)
Xray only visit for left knee per Dr. Junius Roads for MRI

## 2020-05-30 NOTE — Telephone Encounter (Signed)
Note dictated

## 2020-05-30 NOTE — Telephone Encounter (Signed)
Can you send see if Dr. Junius Roads can addend results of xray or send something with xray results. I went into imaging for the xrya and its not telling me anything.

## 2020-05-31 ENCOUNTER — Encounter: Payer: Self-pay | Admitting: Internal Medicine

## 2020-05-31 ENCOUNTER — Other Ambulatory Visit: Payer: Self-pay

## 2020-05-31 ENCOUNTER — Ambulatory Visit (INDEPENDENT_AMBULATORY_CARE_PROVIDER_SITE_OTHER): Payer: Managed Care, Other (non HMO) | Admitting: Internal Medicine

## 2020-05-31 VITALS — BP 120/84 | HR 88 | Temp 98.1°F | Ht 69.95 in | Wt 183.8 lb

## 2020-05-31 DIAGNOSIS — Z125 Encounter for screening for malignant neoplasm of prostate: Secondary | ICD-10-CM | POA: Diagnosis not present

## 2020-05-31 DIAGNOSIS — Z0001 Encounter for general adult medical examination with abnormal findings: Secondary | ICD-10-CM

## 2020-05-31 DIAGNOSIS — Z289 Immunization not carried out for unspecified reason: Secondary | ICD-10-CM

## 2020-05-31 DIAGNOSIS — I10 Essential (primary) hypertension: Secondary | ICD-10-CM

## 2020-05-31 DIAGNOSIS — R5383 Other fatigue: Secondary | ICD-10-CM

## 2020-05-31 DIAGNOSIS — Z1211 Encounter for screening for malignant neoplasm of colon: Secondary | ICD-10-CM

## 2020-05-31 DIAGNOSIS — E039 Hypothyroidism, unspecified: Secondary | ICD-10-CM

## 2020-05-31 DIAGNOSIS — S86112D Strain of other muscle(s) and tendon(s) of posterior muscle group at lower leg level, left leg, subsequent encounter: Secondary | ICD-10-CM

## 2020-05-31 DIAGNOSIS — Z7901 Long term (current) use of anticoagulants: Secondary | ICD-10-CM

## 2020-05-31 DIAGNOSIS — R7303 Prediabetes: Secondary | ICD-10-CM

## 2020-05-31 DIAGNOSIS — Z9229 Personal history of other drug therapy: Secondary | ICD-10-CM

## 2020-05-31 LAB — PROTIME-INR: INR: 3.8 — AB (ref 0.9–1.1)

## 2020-05-31 NOTE — Telephone Encounter (Signed)
Xray faxed to [facility] for review

## 2020-05-31 NOTE — Patient Instructions (Signed)
Referral to [facility] for colonoscopy in progress  Health Maintenance, Male Adopting a healthy lifestyle and getting preventive care are important in promoting health and wellness. Ask your health care provider about:  The right schedule for you to have regular tests and exams.  Things you can do on your own to prevent diseases and keep yourself healthy. What should I know about diet, weight, and exercise? Eat a healthy diet   Eat a diet that includes plenty of vegetables, fruits, low-fat dairy products, and lean protein.  Do not eat a lot of foods that are high in solid fats, added sugars, or sodium. Maintain a healthy weight Body mass index (BMI) is a measurement that can be used to identify possible weight problems. It estimates body fat based on height and weight. Your health care provider can help determine your BMI and help you achieve or maintain a healthy weight. Get regular exercise Get regular exercise. This is one of the most important things you can do for your health. Most adults should:  Exercise for at least 150 minutes each week. The exercise should increase your heart rate and make you sweat (moderate-intensity exercise).  Do strengthening exercises at least twice a week. This is in addition to the moderate-intensity exercise.  Spend less time sitting. Even light physical activity can be beneficial. Watch cholesterol and blood lipids Have your blood tested for lipids and cholesterol at 55 years of age, then have this test every 5 years. You may need to have your cholesterol levels checked more often if:  Your lipid or cholesterol levels are high.  You are older than 55 years of age.  You are at high risk for heart disease. What should I know about cancer screening? Many types of cancers can be detected early and may often be prevented. Depending on your health history and family history, you may need to have cancer screening at various ages. This may include  screening for:  Colorectal cancer.  Prostate cancer.  Skin cancer.  Lung cancer. What should I know about heart disease, diabetes, and high blood pressure? Blood pressure and heart disease  High blood pressure causes heart disease and increases the risk of stroke. This is more likely to develop in people who have high blood pressure readings, are of African descent, or are overweight.  Talk with your health care provider about your target blood pressure readings.  Have your blood pressure checked: ? Every 3-5 years if you are 84-37 years of age. ? Every year if you are 10 years old or older.  If you are between the ages of 54 and 44 and are a current or former smoker, ask your health care provider if you should have a one-time screening for abdominal aortic aneurysm (AAA). Diabetes Have regular diabetes screenings. This checks your fasting blood sugar level. Have the screening done:  Once every three years after age 66 if you are at a normal weight and have a low risk for diabetes.  More often and at a younger age if you are overweight or have a high risk for diabetes. What should I know about preventing infection? Hepatitis B If you have a higher risk for hepatitis B, you should be screened for this virus. Talk with your health care provider to find out if you are at risk for hepatitis B infection. Hepatitis C Blood testing is recommended for:  Everyone born from [birth year] through 1966.  Anyone with known risk factors for hepatitis C. Sexually transmitted  infections (STIs)  You should be screened each year for STIs, including gonorrhea and chlamydia, if: ? You are sexually active and are younger than 55 years of age. ? You are older than 55 years of age and your health care provider tells you that you are at risk for this type of infection. ? Your sexual activity has changed since you were last screened, and you are at increased risk for chlamydia or gonorrhea. Ask your health  care provider if you are at risk.  Ask your health care provider about whether you are at high risk for HIV. Your health care provider may recommend a prescription medicine to help prevent HIV infection. If you choose to take medicine to prevent HIV, you should first get tested for HIV. You should then be tested every 3 months for as long as you are taking the medicine. Follow these instructions at home: Lifestyle  Do not use any products that contain nicotine or tobacco, such as cigarettes, e-cigarettes, and chewing tobacco. If you need help quitting, ask your health care provider.  Do not use street drugs.  Do not share needles.  Ask your health care provider for help if you need support or information about quitting drugs. Alcohol use  Do not drink alcohol if your health care provider tells you not to drink.  If you drink alcohol: ? Limit how much you have to 0-2 drinks a day. ? Be aware of how much alcohol is in your drink. In the [place] one drink equals one 12 oz bottle of beer (355 mL), one 5 oz glass of wine (148 mL), or one 1 oz glass of hard liquor (44 mL). General instructions  Schedule regular health, dental, and eye exams.  Stay current with your vaccines.  Tell your health care provider if: ? You often feel depressed. ? You have ever been abused or do not feel safe at home. Summary  Adopting a healthy lifestyle and getting preventive care are important in promoting health and wellness.  Follow your health care provider's instructions about healthy diet, exercising, and getting tested or screened for diseases.  Follow your health care provider's instructions on monitoring your cholesterol and blood pressure. This information is not intended to replace advice given to you by your health care provider. Make sure you discuss any questions you have with your health care provider. Document Revised: 07/28/2018 Document Reviewed: 07/28/2018 Elsevier Patient Education  2020  [employer].

## 2020-05-31 NOTE — Assessment & Plan Note (Signed)

## 2020-05-31 NOTE — Progress Notes (Signed)
Patient ID: Frank [name], male    DOB: [birth year]  Age: 54 y.o. MRN: [id number]  The patient is here for annual preventive examination and management of other chronic and acute problems.  This visit occurred during the SARS-CoV-2 public health emergency.  Safety protocols were in place, including screening questions prior to the visit, additional usage of staff PPE, and extensive cleaning of exam room while observing appropriate contact time as indicated for disinfecting solutions.   Patient has received both doses of the available COVID 19 vaccine without complications.  Patient continues to mask when outside of the home except when walking in yard or at safe distances from others .  Patient denies any change in mood or development of unhealthy behaviors resuting from the pandemic's restriction of activities and socialization.     The risk factors are reflected in the social history.  The roster of all physicians providing medical care to patient - is listed in the Snapshot section of the chart.  Activities of daily living:  The patient is 100% independent in all ADLs: dressing, toileting, feeding as well as independent mobility  Home safety : The patient has smoke detectors in the home. They wear seatbelts.  There are no firearms at home. There is no violence in the home.   There is no risks for hepatitis, STDs or HIV. There is no   history of blood transfusion. They have no travel history to infectious disease endemic areas of the world.  The patient has seen their dentist in the last six month. They have seen their eye doctor in the last year. They admit to slight hearing difficulty with regard to whispered voices and some television programs.  They have deferred audiologic testing in the last year.  They do not  have excessive sun exposure. Discussed the need for sun protection: hats, long sleeves and use of sunscreen if there is significant sun exposure.   Diet: the importance of a  healthy diet is discussed. They do have a healthy diet.  The benefits of regular aerobic exercise were discussed. Modified activity since left gastroc muscle rupture sept 27 .   Depression screen: there are no signs or vegative symptoms of depression- irritability, change in appetite, anhedonia, sadness/tearfullness.  Cognitive assessment: the patient manages all their financial and personal affairs and is actively engaged. They could relate day,date,year and events; recalled 2/3 objects at 3 minutes; performed clock-face test normally.  The following portions of the patient's history were reviewed and updated as appropriate: allergies, current medications, past family history, past medical history,  past surgical history, past social history  and problem list.  Visual acuity was not assessed per patient preference since she has regular follow up with her ophthalmologist. Hearing and body mass index were assessed and reviewed.   During the course of the visit the patient was educated and counseled about appropriate screening and preventive services including : fall prevention , diabetes screening, nutrition counseling, colorectal cancer screening, and recommended immunizations.    CC: The primary encounter diagnosis was Encounter for general adult medical examination with abnormal findings. Diagnoses of Fatigue, unspecified type, Prostate cancer screening, Colon cancer screening, COVID-19 vaccine regimen to maintain immunity not administered, Gastrocnemius muscle tear, left, subsequent encounter, Long term (current) use of anticoagulants, Essential hypertension, Acquired hypothyroidism, and COVID-19 vaccine series completed were also pertinent to this visit.  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  Swells a lot  with standing.  Coumadin was not stopped due to risk of clot per Ortho.  Week 3 of rest /treatment  by [facility].  Slow recovery.  Considering MRI.  No history of fluoroquinolone use.  Sinus infection occurred 3 days after,  covid negative x 3 at work ([facility]) via PCR   . Treated by ES  .  Resolved.  Not sleeping well,  Daytime lethargy and hypersomnolence  New onset .  Feels good upon waking  But by 11 starts to feel tired and sleepy .  Eats cereal for breakfast or oatmeal.    History Frank [name] has a past medical history of Congenital heart valve abnormality, Dysrhythmia, GERD (gastroesophageal reflux disease), Hypercholesterolemia, and PONV (postoperative nausea and vomiting).   He has a past surgical history that includes Inguinal hernia repair (2006); Tonsillectomy (2001); Anterior cruciate ligament repair (Right, 08/01/2014); TEE without cardioversion (N/A, 08/01/2014); coronary angiogram (10/26/2014); Cardiac catheterization; Bentall procedure (N/A, 11/27/2014); TEE without cardioversion (N/A, 11/27/2014); and Colonoscopy (2010).   His family history includes Arthritis in his mother; Heart disease in his father.He reports that he has never smoked. He has never used smokeless tobacco. He reports current alcohol use of about 7.0 standard drinks of alcohol per week. He reports that he does not use drugs.  Outpatient Medications Prior to Visit  Medication Sig Dispense Refill  . ALPRAZolam (XANAX) 0.5 MG tablet TAKE 1 TABLET BY MOUTH EVERY DAY AT BEDTIME AS NEEDED 30 tablet 5  . Ascorbic Acid (VITAMIN C) 1000 MG tablet Take 1,000 mg by mouth daily.    . hydrochlorothiazide (HYDRODIURIL) 12.5 MG tablet TAKE 1 TABLET BY MOUTH  DAILY 90 tablet 3  . levothyroxine (SYNTHROID) 50 MCG tablet TAKE 1 TABLET BY MOUTH  DAILY BEFORE BREAKFAST 90 tablet 3  . losartan (COZAAR) 50 MG tablet TAKE 1 TABLET BY MOUTH  DAILY 90 tablet 3  . metoprolol tartrate (LOPRESSOR) 25 MG tablet TAKE 1 TABLET BY MOUTH  TWICE DAILY 180 tablet 3  . rosuvastatin (CRESTOR) 20 MG tablet TAKE 2 TABLETS BY MOUTH  DAILY 180 tablet 7  .  warfarin (COUMADIN) 5 MG tablet TAKE 1/2 TO 1 TABLET BY  MOUTH DAILY AS DIRECTED BY  COUMADIN CLINIC 90 tablet 1  . amoxicillin-clavulanate (AUGMENTIN) 875-125 MG tablet Take 1 tablet by mouth 2 (two) times daily. (Patient not taking: Reported on 05/31/2020) 14 tablet 0  . benzonatate (TESSALON) 200 MG capsule Take 1 capsule (200 mg total) by mouth 2 (two) times daily as needed for cough. (Patient not taking: Reported on 05/31/2020) 20 capsule 0  . HYDROcodone-acetaminophen (NORCO/VICODIN) 5-325 MG tablet Take 1-2 tablets by mouth at bedtime as needed for moderate pain. (Patient not taking: Reported on 05/31/2020) 14 tablet 0   No facility-administered medications prior to visit.    Review of Systems   Patient denies headache, fevers, malaise, unintentional weight loss, skin rash, eye pain, sinus congestion and sinus pain, sore throat, dysphagia,  hemoptysis , cough, dyspnea, wheezing, chest pain, palpitations, orthopnea, edema, abdominal pain, nausea, melena, diarrhea, constipation, flank pain, dysuria, hematuria, urinary  Frequency, nocturia, numbness, tingling, seizures,  Focal weakness, Loss of consciousness,  Tremor, insomnia, depression, anxiety, and suicidal ideation.      Objective:  BP 120/84 (BP Location: Left Arm, Patient Position: Sitting, Cuff Size: Large)   Pulse 88   Temp 98.1 F (36.7 C)   Ht 5' 9.95" (1.777 m)   Wt 183 lb 12.8 oz (83.4 kg)   SpO2 99%   BMI 26.41  kg/m   Physical Exam  General appearance: alert, cooperative and appears stated age Ears: normal TM's and external ear canals both ears Throat: lips, mucosa, and tongue normal; teeth and gums normal Neck: no adenopathy, no carotid bruit, supple, symmetrical, trachea midline and thyroid not enlarged, symmetric, no tenderness/mass/nodules Back: symmetric, no curvature. ROM normal. No CVA tenderness. Lungs: clear to auscultation bilaterally Heart: regular rate and rhythm, S1, S2 normal, no murmur, click, rub or  gallop Abdomen: soft, non-tender; bowel sounds normal; no masses,  no organomegaly Pulses: 2+ and symmetric Skin: Skin color, texture, turgor normal. No rashes or lesions MSK: LLE wrapped in Ace bandage from ankle to knee  Lymph nodes: Cervical, supraclavicular, and axillary nodes normal.   Assessment & Plan:   Problem List Items Addressed This Visit      Unprioritized   Acquired hypothyroidism    .Thyroid function is WNL on current dose.  No current changes needed.   Lab Results  Component Value Date   TSH 3.240 05/31/2020         COVID-19 vaccine series completed    Antibody status confirmed with testing      Encounter for general adult medical examination with abnormal findings - Primary    age appropriate education and counseling updated, referrals for preventative services and immunizations addressed, dietary and smoking counseling addressed, most recent labs reviewed.  I have personally reviewed and have noted:  1) the patient's medical and social history 2) The pt's use of alcohol, tobacco, and illicit drugs 3) The patient's current medications and supplements 4) Functional ability including ADL's, fall risk, home safety risk, hearing and visual impairment 5) Diet and physical activities 6) Evidence for depression or mood disorder 7) The patient's height, weight, and BMI have been recorded in the chart  I have made referrals, and provided counseling and education based on review of the above      Essential hypertension    Well controlled on current regimen  Of losartan 50 mg .  hctz 12.5 mg,  And metoprolol 25  Mg . Renal function stable, no changes today.  Lab Results  Component Value Date   CREATININE 1.27 05/31/2020         Fatigue    Etiology unclear.  Negative screen for thyroid, anemia,  B12 and iron deficiency, hepatic and renal insufficiency, encourage regular exercise 5 days /week,  consider sleep study and cardiology evaluation if  Snoring noted  or exertional dyspnea reported      Relevant Orders   B12 and Folate Panel (Completed)   CBC with Differential/Platelet (Completed)   Comprehensive metabolic panel (Completed)   Iron, TIBC and Ferritin Panel (Completed)   Testos,Total,Free and SHBG (Male)   TSH (Completed)   Gastrocnemius muscle tear, left, subsequent encounter    Cause is unknown,  Occurred without trauma.  Managed by Orthopedics      Long term (current) use of anticoagulants    He is due for colonoscopy and will need to have suspension of warfarin with lovenox bridging        Other Visit Diagnoses    Prostate cancer screening       Relevant Orders   PSA (Completed)   Colon cancer screening       Relevant Orders   Ambulatory referral to General Surgery   COVID-19 vaccine regimen to maintain immunity not administered       Relevant Orders   SARS-CoV-2 Semi-Quantitative Total Antibody, Spike      I have  discontinued Stephannie Li. Knipple's HYDROcodone-acetaminophen, amoxicillin-clavulanate, and benzonatate. I am also having him maintain his vitamin C, levothyroxine, metoprolol tartrate, ALPRAZolam, hydrochlorothiazide, warfarin, rosuvastatin, and losartan.  No orders of the defined types were placed in this encounter.   Medications Discontinued During This Encounter  Medication Reason  . amoxicillin-clavulanate (AUGMENTIN) 875-125 MG tablet Completed Course  . benzonatate (TESSALON) 200 MG capsule Completed Course  . HYDROcodone-acetaminophen (NORCO/VICODIN) 5-325 MG tablet Completed Course    Follow-up: No follow-ups on file.   [name], MD

## 2020-06-01 ENCOUNTER — Ambulatory Visit (INDEPENDENT_AMBULATORY_CARE_PROVIDER_SITE_OTHER): Payer: Managed Care, Other (non HMO) | Admitting: Pharmacist Clinician (PhC)/ Clinical Pharmacy Specialist

## 2020-06-01 DIAGNOSIS — R5383 Other fatigue: Secondary | ICD-10-CM | POA: Insufficient documentation

## 2020-06-01 DIAGNOSIS — Z7901 Long term (current) use of anticoagulants: Secondary | ICD-10-CM | POA: Diagnosis not present

## 2020-06-01 DIAGNOSIS — Z954 Presence of other heart-valve replacement: Secondary | ICD-10-CM

## 2020-06-01 DIAGNOSIS — S86112D Strain of other muscle(s) and tendon(s) of posterior muscle group at lower leg level, left leg, subsequent encounter: Secondary | ICD-10-CM | POA: Insufficient documentation

## 2020-06-01 DIAGNOSIS — Z9229 Personal history of other drug therapy: Secondary | ICD-10-CM | POA: Insufficient documentation

## 2020-06-01 LAB — COMPREHENSIVE METABOLIC PANEL
ALT: 29 IU/L (ref 0–44)
AST: 28 IU/L (ref 0–40)
Albumin/Globulin Ratio: 1.8 (ref 1.2–2.2)
Albumin: 4.5 g/dL (ref 3.8–4.9)
Alkaline Phosphatase: 73 IU/L (ref 44–121)
BUN/Creatinine Ratio: 17 (ref 9–20)
BUN: 21 mg/dL (ref 6–24)
Bilirubin Total: 0.3 mg/dL (ref 0.0–1.2)
CO2: 27 mmol/L (ref 20–29)
Calcium: 9.9 mg/dL (ref 8.7–10.2)
Chloride: 97 mmol/L (ref 96–106)
Creatinine, Ser: 1.27 mg/dL (ref 0.76–1.27)
GFR calc Af Amer: 73 mL/min/{1.73_m2} (ref >59)
GFR calc non Af Amer: 63 mL/min/{1.73_m2} (ref >59)
Globulin, Total: 2.5 g/dL (ref 1.5–4.5)
Glucose: 93 mg/dL (ref 65–99)
Potassium: 4.3 mmol/L (ref 3.5–5.2)
Sodium: 138 mmol/L (ref 134–144)
Total Protein: 7 g/dL (ref 6.0–8.5)

## 2020-06-01 LAB — IRON,TIBC AND FERRITIN PANEL
Ferritin: 290 ng/mL (ref 30–400)
Iron Saturation: 21 % (ref 15–55)
Iron: 78 ug/dL (ref 38–169)
Total Iron Binding Capacity: 364 ug/dL (ref 250–450)
UIBC: 286 ug/dL (ref 111–343)

## 2020-06-01 LAB — CBC WITH DIFFERENTIAL/PLATELET
Basophils Absolute: 0.1 10*3/uL (ref 0.0–0.2)
Basos: 1 %
EOS (ABSOLUTE): 0.2 10*3/uL (ref 0.0–0.4)
Eos: 2 %
Hematocrit: 41.6 % (ref 37.5–51.0)
Hemoglobin: 13.7 g/dL (ref 13.0–17.7)
Immature Grans (Abs): 0.1 10*3/uL (ref 0.0–0.1)
Immature Granulocytes: 1 %
Lymphocytes Absolute: 2.3 10*3/uL (ref 0.7–3.1)
Lymphs: 25 %
MCH: 29.2 pg (ref 26.6–33.0)
MCHC: 32.9 g/dL (ref 31.5–35.7)
MCV: 89 fL (ref 79–97)
Monocytes Absolute: 0.8 10*3/uL (ref 0.1–0.9)
Monocytes: 9 %
Neutrophils Absolute: 5.7 10*3/uL (ref 1.4–7.0)
Neutrophils: 62 %
Platelets: 412 10*3/uL (ref 150–450)
RBC: 4.69 x10E6/uL (ref 4.14–5.80)
RDW: 13.6 % (ref 11.6–15.4)
WBC: 9.2 10*3/uL (ref 3.4–10.8)

## 2020-06-01 LAB — B12 AND FOLATE PANEL
Folate: 8.2 ng/mL (ref >3.0)
Vitamin B-12: 494 pg/mL (ref 232–1245)

## 2020-06-01 LAB — SARS-COV-2 SEMI-QUANTITATIVE TOTAL ANTIBODY, SPIKE
SARS-CoV-2 Semi-Quant Total Ab: 207.4 U/mL (ref <0.8)
SARS-CoV-2 Spike Ab Interp: POSITIVE

## 2020-06-01 LAB — TSH: TSH: 3.24 u[IU]/mL (ref 0.450–4.500)

## 2020-06-01 LAB — PSA: Prostate Specific Ag, Serum: 0.5 ng/mL (ref 0.0–4.0)

## 2020-06-01 NOTE — Assessment & Plan Note (Addendum)
Managed with Crestor for CAD noted during workup for AVR  Lab Results  Component Value Date   CHOL 164 03/08/2020   HDL 43 03/08/2020   LDLCALC 95 03/08/2020   TRIG 145 03/08/2020   CHOLHDL 4.0 09/21/2019

## 2020-06-01 NOTE — Assessment & Plan Note (Addendum)
He is due for colonoscopy and will need to have suspension of warfarin with lovenox bridging

## 2020-06-01 NOTE — Assessment & Plan Note (Signed)
Antibody status confirmed with testing

## 2020-06-01 NOTE — Assessment & Plan Note (Signed)
Etiology unclear.  Negative screen for thyroid, anemia,  B12 and iron deficiency, hepatic and renal insufficiency, encourage regular exercise 5 days /week,  consider sleep study and cardiology evaluation if  Snoring noted or exertional dyspnea reported

## 2020-06-01 NOTE — Assessment & Plan Note (Signed)
Well controlled on current regimen  Of losartan 50 mg .  hctz 12.5 mg,  And metoprolol 25  Mg . Renal function stable, no changes today.  Lab Results  Component Value Date   CREATININE 1.27 05/31/2020

## 2020-06-01 NOTE — Assessment & Plan Note (Signed)
.  Thyroid function is WNL on current dose.  No current changes needed.   Lab Results  Component Value Date   TSH 3.240 05/31/2020

## 2020-06-01 NOTE — Assessment & Plan Note (Signed)
Cause is unknown,  Occurred without trauma.  Managed by Orthopedics

## 2020-06-06 ENCOUNTER — Telehealth: Payer: Self-pay | Admitting: Family Medicine

## 2020-06-06 NOTE — Telephone Encounter (Signed)
Pt states he is waiting on options from imaging to get MRI

## 2020-06-06 NOTE — Telephone Encounter (Signed)
Pt called stating when he called to schedule his MRI there was a note on his file stating he still needed to get authorization from our office; the pt has been trying to set this appt for a about 2 weeks now. Pt would really like to have this handled today if possible so they can make an appt. Pt states [facility] would like to have this authorization called in.  [id number]

## 2020-06-10 ENCOUNTER — Ambulatory Visit
Admission: RE | Admit: 2020-06-10 | Discharge: 2020-06-10 | Disposition: A | Discharge status: Home / Self Care | Payer: Managed Care, Other (non HMO) | Source: Ambulatory Visit | Attending: Family Medicine | Admitting: Family Medicine

## 2020-06-10 ENCOUNTER — Other Ambulatory Visit: Payer: Self-pay

## 2020-06-10 DIAGNOSIS — M79662 Pain in left lower leg: Secondary | ICD-10-CM

## 2020-06-11 ENCOUNTER — Telehealth: Payer: Self-pay | Admitting: Family Medicine

## 2020-06-11 NOTE — Telephone Encounter (Signed)
Tendons look intact on MRI scan.  Muscle tear will heal with time, without surgery.

## 2020-06-19 ENCOUNTER — Ambulatory Visit: Payer: Managed Care, Other (non HMO)

## 2020-06-21 LAB — POCT INR: INR: 2.9 (ref 2.0–3.0)

## 2020-06-26 ENCOUNTER — Ambulatory Visit (INDEPENDENT_AMBULATORY_CARE_PROVIDER_SITE_OTHER): Payer: Managed Care, Other (non HMO) | Admitting: Cardiology

## 2020-06-26 DIAGNOSIS — Z954 Presence of other heart-valve replacement: Secondary | ICD-10-CM

## 2020-07-04 LAB — COLOGUARD: Cologuard: NEGATIVE

## 2020-07-16 LAB — EXTERNAL GENERIC LAB PROCEDURE: COLOGUARD: NEGATIVE

## 2020-07-16 LAB — COLOGUARD: COLOGUARD: NEGATIVE

## 2020-08-01 ENCOUNTER — Other Ambulatory Visit: Payer: Self-pay | Admitting: Internal Medicine

## 2020-08-09 LAB — POCT INR: INR: 2.2 (ref 2.0–3.0)

## 2020-08-15 ENCOUNTER — Ambulatory Visit (INDEPENDENT_AMBULATORY_CARE_PROVIDER_SITE_OTHER): Payer: Managed Care, Other (non HMO) | Admitting: Cardiovascular Disease

## 2020-08-15 DIAGNOSIS — Z954 Presence of other heart-valve replacement: Secondary | ICD-10-CM

## 2020-08-29 ENCOUNTER — Other Ambulatory Visit: Payer: Self-pay | Admitting: Internal Medicine

## 2020-09-03 ENCOUNTER — Other Ambulatory Visit: Payer: Self-pay | Admitting: Internal Medicine

## 2020-09-03 DIAGNOSIS — Z954 Presence of other heart-valve replacement: Secondary | ICD-10-CM

## 2020-09-28 ENCOUNTER — Other Ambulatory Visit: Payer: Self-pay | Admitting: Internal Medicine

## 2020-09-28 NOTE — Telephone Encounter (Signed)
Last OV 05/31/20 ok to fill?

## 2020-10-01 LAB — POCT INR: INR: 2.3 (ref 2.0–3.0)

## 2020-10-02 ENCOUNTER — Ambulatory Visit (INDEPENDENT_AMBULATORY_CARE_PROVIDER_SITE_OTHER): Payer: Managed Care, Other (non HMO) | Admitting: Cardiology

## 2020-10-02 DIAGNOSIS — Z954 Presence of other heart-valve replacement: Secondary | ICD-10-CM | POA: Diagnosis not present

## 2020-10-02 DIAGNOSIS — Z7901 Long term (current) use of anticoagulants: Secondary | ICD-10-CM | POA: Diagnosis not present

## 2020-10-26 ENCOUNTER — Ambulatory Visit: Payer: Managed Care, Other (non HMO) | Admitting: Internal Medicine

## 2020-11-01 ENCOUNTER — Encounter: Payer: Self-pay | Admitting: Internal Medicine

## 2020-11-01 ENCOUNTER — Other Ambulatory Visit: Payer: Self-pay

## 2020-11-01 ENCOUNTER — Ambulatory Visit: Payer: Managed Care, Other (non HMO) | Admitting: Internal Medicine

## 2020-11-01 VITALS — BP 108/64 | HR 53 | Ht 70.0 in | Wt 188.8 lb

## 2020-11-01 DIAGNOSIS — Z954 Presence of other heart-valve replacement: Secondary | ICD-10-CM | POA: Diagnosis not present

## 2020-11-01 DIAGNOSIS — I2510 Atherosclerotic heart disease of native coronary artery without angina pectoris: Secondary | ICD-10-CM | POA: Diagnosis not present

## 2020-11-01 DIAGNOSIS — E785 Hyperlipidemia, unspecified: Secondary | ICD-10-CM

## 2020-11-01 DIAGNOSIS — Z7901 Long term (current) use of anticoagulants: Secondary | ICD-10-CM

## 2020-11-01 DIAGNOSIS — Z9889 Other specified postprocedural states: Secondary | ICD-10-CM

## 2020-11-01 NOTE — Patient Instructions (Signed)
Medication Instructions:  Your physician recommends that you continue on your current medications as directed. Please refer to the Current Medication list given to you today.  *If you need a refill on your cardiac medications before your next appointment, please call your pharmacy*  Testing/Procedures: Your physician has requested that you have an echocardiogram. Echocardiography is a painless test that uses sound waves to create images of your heart. It provides your doctor with information about the size and shape of your heart and how well your heart's chambers and valves are working. This procedure takes approximately one hour. There are no restrictions for this procedure. -- due March 2023 -- [address]   Follow-Up: At [facility], you and your health needs are our priority.  As part of our continuing mission to provide you with exceptional heart care, we have created designated Provider Care Teams.  These Care Teams include your primary Cardiologist (physician) and Advanced Practice Providers (APPs -  Physician Assistants and Nurse Practitioners) who all work together to provide you with the care you need, when you need it.  We recommend signing up for the patient portal called "MyChart".  Sign up information is provided on this After Visit Summary.  MyChart is used to connect with patients for Virtual Visits (Telemedicine).  Patients are able to view lab/test results, encounter notes, upcoming appointments, etc.  Non-urgent messages can be sent to your provider as well.   To learn more about what you can do with MyChart, go to [email or web address].    Your next appointment:   12 month(s)  The format for your next appointment:   In Person  Provider:   You may see [name], MD or one of the following Advanced Practice Providers on your designated Care Team:    Almyra Deforest, PA-C  Fabian Sharp, PA-C or   Roby Lofts, [place]    Other  Instructions

## 2020-11-01 NOTE — Progress Notes (Signed)
OFFICE NOTE  Chief Complaint:  No complaints  Primary Care Physician: [name], MD  HPI:  Frank [name] is a 55 y.o. male with a history of at least moderate AS (followed by Dr. [name] at the [facility]), presented for ACL repair with Dr. [name]. Preoperatively, he was evaluated by Dr. [name] (cardiac anesthesia) and found to have a loud AS murmur- After some discussion, interoperative TEE was recommended and performed. I personally reviewed the TEE images which do demonstrate severe aortic stenosis, moderate AI and mild to moderate MR (obtained during surgery, on pressor, PPV, etc, but still significant). He was recently told he probably had another 5 years before needing to consider surgery. Mean gradient is already above 40 mmHg. Clinically, he has been asymptomatic (no chest pain, dyspnea, syncope, etc,) but recently he has had more palpitations (PVC's) and was started on b-blocker. He did manage to get through surgery uneventfully.   He returns today in follow-up to the office. He continues to deny any chest pain, worsening shortness of breath, presyncope or syncopal episodes. He has not pushed himself significantly due to his recent knee surgery. He is undergoing rehabilitation and getting stronger. A repeat echocardiogram was just performed as an outpatient. This was initially read by one of my partners and indicated mild to moderate aortic stenosis based on a mean valve gradient in the 20s. However subsequent images during the study do indicate mean gradients greater than 50 from the right upper sternal border using Pedoff probe. I therefore personally reviewed the echocardiogram images and made the appropriate corrections, and the results are as follows:  Study Conclusions  - Left ventricle: The cavity size was normal. Wall thickness was increased in a pattern of mild LVH. Systolic function was normal. The estimated ejection fraction was in the range of 55% to  60%. - Aortic valve: The aortic valve is bicuspid and is heavily calcified with restricted leaflet motion. Peak and mean gradients through the valve are 100 and 59 mm Hg respectively, consistent with severe AS. The calculated AVA is 0.6 cm2. There is moderate, eccentric AI. There was mild regurgitation. - Aorta: Aortic root dimension: 39 mm (ED). - Aortic root: The aortic root is mildly dilated. - Mitral valve: Calcified annulus. Mildly thickened leaflets . There was mild regurgitation. - Left atrium: LA Volume/BSA= 46.4 ml/m2. The atrium is moderately dilated.  Impressions:  - LVEF 60-65%, moderate concentric LVH, bicuspid and severely stenotic aortic valve- AVA 0.6 cm2, peak and mean gradients of 100 mmHg and 59 mmHg, respectively - LVOT diameter measured at 2.4 cm. There is moderate AI. There is mild to moderate MR with restricted anterior leaflet motion secondary to eccentric AI.   Pedoff measurements do indeed show a very high mean gradient of 59 and peak gradient of 100 mmHg. Based on a generous LVOT diameter of 2.4 cm, the calculated aortic valve gradient was 0.6 cm consistent with severe aortic stenosis.  I saw Frank [name] back in the office today. He was recently on a trip and noticed increasing shortness of breath and easy fatigue with exercise which was a new finding for him. He feels that this is symptomatic aortic stenosis and I would agree. I discussed the case further with Dr. Cyndia Bent, the cardiac thoracic surgeon that he's been seeing, and we feel that it's probably the time now to entertain valve replacement. Based on that there is also concern about aortopathy given his bicuspid valve. He underwent a CT scan of  the aorta and coronaries which showed does reveal multivessel coronary artery calcification as well as calcium deposits in the left main coronary. The aortic valve is heavily calcified and restricted in motion. There was at least mild cardiac  hypertrophy and dilatation of the left ventricle. The ascending aorta is dilated measuring 4.3-4.4 cm. There is no evidence for coarctation or enlargement of the thoracic or descending aorta.  Frank [name] returns today for follow-up. He successfully underwent Bentall procedure with hemi-arch replacement using a 28 mm Hemashield graft under deep hypothermic circulatory arrest and Bentall Procedure ( replacement of aortic valve and aortic root with reimplantation of the coronary arteries) using a 21 mm St. Jude mechanical valve graft. Overall he is doing exceedingly well. He continues to do exercise. He is also recovering from recent knee surgery. He denies any chest pain or worsening shortness of breath. His energy levels improved significantly. His median sternotomy is healing well. He did have some postoperative anemia however this seems to have improved and his energy level is better.  I saw Frank [name] back in the office today for follow-up. He is doing exceedingly well. He's been active and working and traveling without any restrictions. He denies any chest pain or shortness of breath. He's had a small amount of weight gain however BMI is only 26. He feels better than he has in years he says. Recently we increased his cholesterol medication as his LDL was still not treated to goal. Particle number was just over 1100. He is due for recheck of this next month. He says he does get some mild muscle soreness after exercise that does not resolve quickly which he may attribute to increase in Crestor.  01/16/2016  Frank [name] returns today for follow-up. He reports that he is doing exceedingly well. His last use been excellent for him. He is exercising more regularly. He's had a small amount of weight gain but is less than 5 pounds and is starting to come down. He denies any chest pain or shortness of breath. His blood pressure is slightly higher recently however and he was started on losartan 50 mg daily.  Initial blood pressure today was 130/90 however recheck was 110/80 in the left arm and 112/82 in the right arm. He was concerned about some differential blood pressures in the past however this difference is negligible. In discussion today we realized he had not had an echocardiogram since his valve surgery and we need to establish a baseline valve gradient.  08/22/2016  Frank [name] returns today for follow-up. He says is feeling exceedingly well. He exercises regularly now and has a better energy level. He denies any shortness of breath or chest pain. He's been compliant with his INR checks and is Coumadin levels have been appropriate. Blood pressure appears well controlled. He has excellent cholesterol control with LDL in the 80s on rosuvastatin.   08/28/2017  Frank [name] is doing well and is without complaints. He is pleased with his AVR. He exercises regularly and has gained some muscle mass. He has maintained a therapeutic INR. He had an echo this past Summer which demonstrated a stable aortic valve gradient and normal LV function. BP is well controlled. Recent labs show LDL of 81 on Crestor.  09/02/2018  Frank [name] is seen today in follow-up.  He is doing extremely well.  He says he is not felt this great in years.  He was struggling with some palpitations however is added some supplementary potassium to his diet which is  helped his symptoms.  He has been therapeutic on warfarin which is been stable.  Weight is stable.  Blood pressure is now well controlled.  He denies any chest pain or shortness of breath.  He exercises several times a week.  09/21/2019  Frank [name] returns today for annual follow-up.  He recently underwent an echo which showed normal systolic function, preserved mechanical aortic valve gradient and stable ascending aortic diameters.  Symptomatically he feels well.  He has no new complaints is worsening shortness of breath or chest pain.  He had some recent labs but is not had  reassessment of his lipids or thyroid.  He is on a low-dose of thyroid replacement.  He has been concerned about some recent development of gynecomastia.  He underwent endocrine testing for this which was unremarkable.  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  11/01/2020  Frank [name] is seen today in follow-up.  Overall he says this past year has been the best so far.  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  This was associated with significant bleeding and hematoma due to being on warfarin.  It was not surgically repaired.  He has recovered from it.  Other than that he is doing well.  His INRs have been very stable.  He had an echo last year which showed stable aortic size and normal gradients across his mechanical aortic valve.  Blood pressure is normal.  Weight is stable.  PMHx:  Past Medical History:  Diagnosis Date  . Congenital heart valve abnormality    bicuspid aortic valve  . Dysrhythmia    PVC's  . GERD (gastroesophageal reflux disease)   . Hypercholesterolemia   . PONV (postoperative nausea and vomiting)    after a tonsillectomy..none since    Past Surgical History:  Procedure Laterality Date  . ANTERIOR CRUCIATE LIGAMENT REPAIR Right 08/01/2014   Procedure: RECONSTRUCTION ANTERIOR CRUCIATE LIGAMENT (ACL) WITH HAMSTRING GRAFT, MENISCAL DEBRIDEMENT AS NEEDED.;  Surgeon: Meredith Pel, MD;  Location: [place];  Service: Orthopedics;  Laterality: Right;  . BENTALL PROCEDURE N/A 11/27/2014   Procedure: BENTALL PROCEDURE;  Surgeon: [name], MD;  Location: [place];  Service: Open Heart Surgery;  Laterality: N/A;  WITH CIRC ARREST  . CARDIAC CATHETERIZATION     10/26/2014  . COLONOSCOPY  2010  . CORONARY ANGIOGRAM  10/26/2014   Procedure: CORONARY ANGIOGRAM;  Surgeon: [name], MD;  Location: [facility] CATH LAB;  Service: Cardiovascular;;  . INGUINAL HERNIA REPAIR  2006    left, Dr. Bary Castilla  . TEE WITHOUT CARDIOVERSION N/A 08/01/2014   Procedure: TRANSESOPHAGEAL ECHOCARDIOGRAM (TEE);  Surgeon: Meredith Pel, MD;  Location: [place];  Service: Orthopedics;  Laterality: N/A;  . TEE WITHOUT CARDIOVERSION N/A 11/27/2014   Procedure: TRANSESOPHAGEAL ECHOCARDIOGRAM (TEE);  Surgeon: [name], MD;  Location: [place];  Service: Open Heart Surgery;  Laterality: N/A;  . TONSILLECTOMY  2001    FAMHx:  Family History  Problem Relation Age of Onset  . Arthritis Mother        psoriatis, Crohn's   . Heart disease Father        CAD in his early 74s    SOCHx:   reports that he has never smoked. He has never used smokeless tobacco. He reports current alcohol use of about 7.0 standard drinks of alcohol per week. He reports that he does not use  drugs.  ALLERGIES:  No Known Allergies  ROS: Pertinent items noted in HPI and remainder of comprehensive ROS otherwise negative.  HOME MEDS: Current Outpatient Medications on File Prior to Visit  Medication Sig Dispense Refill  . ALPRAZolam (XANAX) 0.5 MG tablet TAKE 1 TABLET BY MOUTH EVERY DAY AT BEDTIME AS NEEDED 30 tablet 5  . Ascorbic Acid (VITAMIN C) 1000 MG tablet Take 1,000 mg by mouth daily.    . hydrochlorothiazide (HYDRODIURIL) 12.5 MG tablet TAKE 1 TABLET BY MOUTH  DAILY 90 tablet 3  . levothyroxine (SYNTHROID) 50 MCG tablet TAKE 1 TABLET BY MOUTH  DAILY BEFORE BREAKFAST 90 tablet 3  . losartan (COZAAR) 50 MG tablet TAKE 1 TABLET BY MOUTH  DAILY 90 tablet 3  . metoprolol tartrate (LOPRESSOR) 25 MG tablet TAKE 1 TABLET BY MOUTH  TWICE DAILY 180 tablet 3  . rosuvastatin (CRESTOR) 20 MG tablet TAKE 2 TABLETS BY MOUTH  DAILY 180 tablet 7  . warfarin (COUMADIN) 5 MG tablet TAKE 1/2 TO 1 TABLET BY  MOUTH DAILY AS DIRECTED BY  COUMADIN CLINIC 90 tablet 1   No current facility-administered medications on file prior to visit.    LABS/IMAGING: No results found for this or any previous visit (from the past 48  hour(s)). No results found.  WEIGHTS: Wt Readings from Last 3 Encounters:  11/01/20 188 lb 12.8 oz (85.6 kg)  05/31/20 183 lb 12.8 oz (83.4 kg)  05/22/20 185 lb (83.9 kg)    VITALS: BP 108/64   Pulse (!) 53   Ht 5\' 10"  (1.778 m)   Wt 188 lb 12.8 oz (85.6 kg)   SpO2 99%   BMI 27.09 kg/m   EXAM: General appearance: alert and no distress Neck: no carotid bruit and no JVD Lungs: clear to auscultation bilaterally Heart: regular rate and rhythm, S1, S2 normal and Sharp mechanical valve sounds Abdomen: soft, non-tender; bowel sounds normal; no masses,  no organomegaly Extremities: extremities normal, atraumatic, no cyanosis or edema Pulses: 2+ and symmetric Skin: Skin color, texture, turgor normal. No rashes or lesions Neurologic: Grossly normal I: Pleasant  EKG: Sinus bradycardia 53, RBBB-personally reviewed  ASSESSMENT: 1. Status post aVR with the mechanical valve (11/27/2014) 2. 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 Mild nonobstructive coronary disease 4. Dyslipidemia 5. History of bicuspid aortic valve with aortopathy  PLAN: 1.   Frank [name] continues to do well with regards to his mechanical aortic valve.  He also had a Bentall hemiarch repair.  Echo last year showed stable dimensions with normal aortic valve gradients.  We will plan to repeat echo next year.  He has had no anginal symptoms.  Cholesterol has been well controlled.  His blood pressure is normal.  He is very physically active.  Plan follow-up with me annually or sooner as necessary  [name], MD, [facility], [place] Director of the Advanced Lipid Disorders &  Cardiovascular Risk Reduction Clinic Diplomate of the American Board of Clinical Lipidology Attending Cardiologist  Direct Dial: [phone]  Fax: [phone]  Website:  [email or web address] 11/01/2020, 9:36 AM

## 2020-11-13 LAB — POCT INR: INR: 1.9 — AB (ref 2.0–3.0)

## 2020-11-14 ENCOUNTER — Ambulatory Visit (INDEPENDENT_AMBULATORY_CARE_PROVIDER_SITE_OTHER): Payer: Managed Care, Other (non HMO) | Admitting: Cardiovascular Disease

## 2020-11-14 DIAGNOSIS — Z0001 Encounter for general adult medical examination with abnormal findings: Secondary | ICD-10-CM

## 2020-11-14 DIAGNOSIS — Z954 Presence of other heart-valve replacement: Secondary | ICD-10-CM | POA: Diagnosis not present

## 2020-11-14 LAB — PROTIME-INR

## 2020-12-27 LAB — POCT INR: INR: 2.5 (ref 2.0–3.0)

## 2020-12-28 ENCOUNTER — Ambulatory Visit (INDEPENDENT_AMBULATORY_CARE_PROVIDER_SITE_OTHER): Payer: Managed Care, Other (non HMO) | Admitting: Cardiology

## 2020-12-28 DIAGNOSIS — Z954 Presence of other heart-valve replacement: Secondary | ICD-10-CM | POA: Diagnosis not present

## 2020-12-28 DIAGNOSIS — Z5181 Encounter for therapeutic drug level monitoring: Secondary | ICD-10-CM | POA: Diagnosis not present

## 2021-01-09 ENCOUNTER — Telehealth: Payer: Managed Care, Other (non HMO) | Admitting: Physician Assistant

## 2021-01-09 ENCOUNTER — Encounter: Payer: Self-pay | Admitting: Physician Assistant

## 2021-01-09 DIAGNOSIS — J019 Acute sinusitis, unspecified: Secondary | ICD-10-CM

## 2021-01-09 DIAGNOSIS — B9689 Other specified bacterial agents as the cause of diseases classified elsewhere: Secondary | ICD-10-CM

## 2021-01-09 MED ORDER — BENZONATATE 100 MG PO CAPS: 100.0000 mg | ORAL_CAPSULE | Freq: Three times a day (TID) | ORAL | 0 refills | Status: DC | PRN

## 2021-01-09 MED ORDER — AMOXICILLIN-POT CLAVULANATE 875-125 MG PO TABS
1.0000 | ORAL_TABLET | Freq: Two times a day (BID) | ORAL | 0 refills | Status: DC
Start: 2021-01-09 — End: 2021-05-31

## 2021-01-09 NOTE — Progress Notes (Signed)
[name], Frank [name] are scheduled for a virtual visit with your provider today.    Just as we do with appointments in the office, we must obtain your consent to participate.  Your consent will be active for this visit and any virtual visit you may have with one of our providers in the next 365 days.    If you have a MyChart account, I can also send a copy of this consent to you electronically.  All virtual visits are billed to your insurance company just like a traditional visit in the office.  As this is a virtual visit, video technology does not allow for your provider to perform a traditional examination.  This may limit your provider's ability to fully assess your condition.  If your provider identifies any concerns that need to be evaluated in person or the need to arrange testing such as labs, EKG, etc, we will make arrangements to do so.    Although advances in technology are sophisticated, we cannot ensure that it will always work on either your end or our end.  If the connection with a video visit is poor, we may have to switch to a telephone visit.  With either a video or telephone visit, we are not always able to ensure that we have a secure connection.   I need to obtain your verbal consent now.   Are you willing to proceed with your visit today?   Frank [name] has provided verbal consent on 01/09/2021 for a virtual visit (video or telephone).  Frank Rio, PA-C 01/09/2021  9:49 AM  Virtual Visit via Video   I connected with patient on 01/09/21 at 10:00 AM EDT by a video enabled telemedicine application and verified that I am speaking with the correct person using two identifiers.  Location patient: Home Location provider: [place] participating in the virtual visit: Patient, Provider  I discussed the limitations of evaluation and management by telemedicine and the availability of in person appointments. The patient expressed understanding and  agreed to proceed.  Subjective:   HPI:   Patient presents via [place] today complaining of worsening sinus symptoms.  Notes towards the end of last week he dealt with chest congestion, cough, scratchy throat and fatigue.  Also noted low-grade fever (less than 100) which has resolved.  Wife is having similar symptoms.  Note they recently had to travel to and from [place] for a family event.  As such initially took a home COVID test which were negative.  Because of symptoms, he took a repeat at home test and even went for PCR test (all on separate days), with all test being negative.  Patient is up-to-date on COVID vaccines and booster which she endorses getting in November of last year.  Is up-to-date on annual influenza vaccine.  Denies any other known sick contact.  States most of his symptoms have improved with the exception of nasal and head congestion.  Now having quite significant sinus pressure and sinus pain that continues to worsen despite use of OTC medicines.  Is currently taking Mucinex DM and Tylenol as needed.  Notes he has a history of sinusitis and typically it continues to progress if not treated.  Of note patient is on anticoagulation with Coumadin, last INR 2 weeks ago and within normal range.   ROS:   See pertinent positives and negatives per HPI.  Patient Active Problem List   Diagnosis Date Noted  . Gastrocnemius muscle tear, left, subsequent  encounter 06/01/2020  . Fatigue 06/01/2020  . COVID-19 vaccine series completed 06/01/2020  . Heart murmur 05/15/2020  . Gynecomastia, male 05/25/2019  . Acquired hypothyroidism 11/12/2017  . Long term (current) use of anticoagulants 08/29/2017  . Rectal bleeding 06/11/2017  . Erectile dysfunction 05/16/2017  . CAD in native artery 07/02/2015  . Dyslipidemia 07/02/2015  . Overweight (BMI 25.0-29.9) 05/06/2015  . Essential hypertension 05/04/2015  . S/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 56/38/7564  . Encounter for  therapeutic drug monitoring 12/04/2014  . S/P ascending aortic replacement 11/27/2014  . S/P ACL repair 05/04/2014  . SVT (supraventricular tachycardia) ([place]) 05/01/2014  . Palpitations 05/01/2014  . Encounter for general adult medical examination with abnormal findings 04/22/2013  . Cyst, epididymis 04/22/2013  . Hx of bicuspid aortic valve 04/20/2013  . Screening for prostate cancer 04/17/2012  . Insomnia 04/15/2012  . Esophageal reflux 04/09/2011  . Hx of inguinal hernia surgery 04/09/2011  . Other malaise and fatigue 04/09/2011  . Other and unspecified hyperlipidemia 04/09/2011    Social History   Tobacco Use  . Smoking status: Never Smoker  . Smokeless tobacco: Never Used  Substance Use Topics  . Alcohol use: Yes    Alcohol/week: 7.0 standard drinks    Types: 7 Glasses of wine per week    Comment: occasional, drink 1 beer per wk    Current Outpatient Medications:  .  ALPRAZolam (XANAX) 0.5 MG tablet, TAKE 1 TABLET BY MOUTH EVERY DAY AT BEDTIME AS NEEDED, Disp: 30 tablet, Rfl: 5 .  Ascorbic Acid (VITAMIN C) 1000 MG tablet, Take 1,000 mg by mouth daily., Disp: , Rfl:  .  hydrochlorothiazide (HYDRODIURIL) 12.5 MG tablet, TAKE 1 TABLET BY MOUTH  DAILY, Disp: 90 tablet, Rfl: 3 .  levothyroxine (SYNTHROID) 50 MCG tablet, TAKE 1 TABLET BY MOUTH  DAILY BEFORE BREAKFAST, Disp: 90 tablet, Rfl: 3 .  losartan (COZAAR) 50 MG tablet, TAKE 1 TABLET BY MOUTH  DAILY, Disp: 90 tablet, Rfl: 3 .  metoprolol tartrate (LOPRESSOR) 25 MG tablet, TAKE 1 TABLET BY MOUTH  TWICE DAILY, Disp: 180 tablet, Rfl: 3 .  rosuvastatin (CRESTOR) 20 MG tablet, TAKE 2 TABLETS BY MOUTH  DAILY, Disp: 180 tablet, Rfl: 7 .  warfarin (COUMADIN) 5 MG tablet, TAKE 1/2 TO 1 TABLET BY  MOUTH DAILY AS DIRECTED BY  COUMADIN CLINIC, Disp: 90 tablet, Rfl: 1  No Known Allergies  Objective:   There were no vitals taken for this visit.  Patient is well-developed, well-nourished in no acute distress.  Resting comfortably   at home.  Head is normocephalic, atraumatic.  No labored breathing.  Speech is clear and coherent with logical content.  Patient is alert and oriented at baseline.  + TTP sinuses  Assessment and Plan:   1. Acute bacterial sinusitis Concern of secondary bacterial sinusitis after recent viral URI giving continually worsening sinus symptoms including sinus pain.  Rx Augmentin.  Increase fluids.  Rest.  Saline nasal spray.  Probiotic.  Mucinex as directed.  Humidifier in bedroom.  Tessalon per orders.  Even though recent INR was normal he has been instructed to reach out to the Coumadin clinic to let them know he has been started on Augmentin for 7 days so they can adjust timing of next INR check.  He agrees to call them ASAP.  Discussed with patient if symptoms or not improving/resolving with current treatment he is to let us or his primary care provider know. - benzonatate (TESSALON) 100 MG capsule; Take 1  capsule (100 mg total) by mouth 3 (three) times daily as needed for cough.  Dispense: 30 capsule; Refill: 0 - amoxicillin-clavulanate (AUGMENTIN) 875-125 MG tablet; Take 1 tablet by mouth 2 (two) times daily.  Dispense: 14 tablet; Refill: 0 .   Frank [name], [place] 01/09/2021

## 2021-01-09 NOTE — Patient Instructions (Signed)
Instructions sent to patients MyChart.

## 2021-01-25 LAB — HEMOGLOBIN A1C: Hemoglobin A1C: 6

## 2021-02-12 LAB — POCT INR: INR: 2.3 (ref 2.0–3.0)

## 2021-02-14 ENCOUNTER — Ambulatory Visit (INDEPENDENT_AMBULATORY_CARE_PROVIDER_SITE_OTHER): Payer: Managed Care, Other (non HMO) | Admitting: Cardiovascular Disease

## 2021-02-14 DIAGNOSIS — Z5181 Encounter for therapeutic drug level monitoring: Secondary | ICD-10-CM

## 2021-02-14 DIAGNOSIS — Z954 Presence of other heart-valve replacement: Secondary | ICD-10-CM

## 2021-03-28 ENCOUNTER — Other Ambulatory Visit: Payer: Self-pay | Admitting: Internal Medicine

## 2021-03-29 ENCOUNTER — Telehealth: Payer: Self-pay

## 2021-03-29 ENCOUNTER — Other Ambulatory Visit: Payer: Self-pay | Admitting: Internal Medicine

## 2021-03-29 ENCOUNTER — Telehealth: Payer: Self-pay | Admitting: Internal Medicine

## 2021-03-29 LAB — POCT INR: INR: 2.4 (ref 2.0–3.0)

## 2021-03-29 NOTE — Telephone Encounter (Signed)
RX Refill:xanax Last Seen:05-31-20 Last ordered:09-28-20

## 2021-03-29 NOTE — Telephone Encounter (Signed)
REFILLED,  PEASE REMIND PATIENT THAT  REFILLS ON CONTROLLED SUBSTANCES REQUIRE A  6 MONTH FOLLOW UP AT APPROPRIATE TIME

## 2021-03-29 NOTE — Telephone Encounter (Signed)
Called and spoke w/pt and he stated that the inr was drawn today and should be resulted by Monday at [facility]

## 2021-04-01 ENCOUNTER — Ambulatory Visit: Payer: Self-pay | Admitting: Internal Medicine

## 2021-04-01 DIAGNOSIS — Z954 Presence of other heart-valve replacement: Secondary | ICD-10-CM

## 2021-05-10 LAB — POCT INR: INR: 2.8 (ref 2.0–3.0)

## 2021-05-13 ENCOUNTER — Ambulatory Visit (INDEPENDENT_AMBULATORY_CARE_PROVIDER_SITE_OTHER): Payer: Managed Care, Other (non HMO) | Admitting: Cardiovascular Disease

## 2021-05-13 DIAGNOSIS — Z5181 Encounter for therapeutic drug level monitoring: Secondary | ICD-10-CM

## 2021-05-13 DIAGNOSIS — Z954 Presence of other heart-valve replacement: Secondary | ICD-10-CM

## 2021-05-29 ENCOUNTER — Other Ambulatory Visit: Payer: Self-pay | Admitting: Internal Medicine

## 2021-05-29 NOTE — Telephone Encounter (Signed)
RX Refill:xanax Last Seen:05-31-20 Last ordered:03-29-21 Next appointment:05-31-21

## 2021-05-31 ENCOUNTER — Other Ambulatory Visit: Payer: Self-pay

## 2021-05-31 ENCOUNTER — Ambulatory Visit (INDEPENDENT_AMBULATORY_CARE_PROVIDER_SITE_OTHER): Payer: Managed Care, Other (non HMO) | Admitting: Internal Medicine

## 2021-05-31 ENCOUNTER — Encounter: Payer: Self-pay | Admitting: Internal Medicine

## 2021-05-31 VITALS — BP 122/80 | HR 59 | Temp 95.9°F | Ht 68.5 in | Wt 174.8 lb

## 2021-05-31 DIAGNOSIS — E785 Hyperlipidemia, unspecified: Secondary | ICD-10-CM | POA: Diagnosis not present

## 2021-05-31 DIAGNOSIS — Z125 Encounter for screening for malignant neoplasm of prostate: Secondary | ICD-10-CM | POA: Diagnosis not present

## 2021-05-31 DIAGNOSIS — F5101 Primary insomnia: Secondary | ICD-10-CM

## 2021-05-31 DIAGNOSIS — Z954 Presence of other heart-valve replacement: Secondary | ICD-10-CM

## 2021-05-31 DIAGNOSIS — Z0000 Encounter for general adult medical examination without abnormal findings: Secondary | ICD-10-CM | POA: Diagnosis not present

## 2021-05-31 DIAGNOSIS — R7303 Prediabetes: Secondary | ICD-10-CM

## 2021-05-31 DIAGNOSIS — E039 Hypothyroidism, unspecified: Secondary | ICD-10-CM

## 2021-05-31 NOTE — Assessment & Plan Note (Addendum)
Managed with low dose benzodiazepine. He is aware of the risk of chronic benzodiazepine use and does not use alprazolam every night;  Alternates 1/2  tablet  with dinnertime dosing of  melatonin

## 2021-05-31 NOTE — Patient Instructions (Addendum)
You can stop the hctz if your home readings are repeatedly   less than 120/70  Try using Debrox drops for the cerumen impaction    Please schedule a 6 month follow up ( can be virtual) to continue receiving refills of alprazolam  (bc  it is a controlled substance)

## 2021-05-31 NOTE — Progress Notes (Signed)
Patient ID: Frank [name], male    DOB: [birth year]  Age: 55 y.o. MRN: [id number]  The patient is here for annual preventive  examination and management of other chronic and acute problems.    This visit occurred during the SARS-CoV-2 public health emergency.  Safety protocols were in place, including screening questions prior to the visit, additional usage of staff PPE, and extensive cleaning of exam room while observing appropriate contact time as indicated for disinfecting solutions.   The risk factors are reflected in the social history.  The roster of all physicians providing medical care to patient - is listed in the Snapshot section of the chart.  Activities of daily living:  The patient is 100% independent in all ADLs: dressing, toileting, feeding as well as independent mobility  Home safety : The patient has smoke detectors in the home. They wear seatbelts.  There are no firearms at home. There is no violence in the home.   There is no risks for hepatitis, STDs or HIV. There is no   history of blood transfusion. They have no travel history to infectious disease endemic areas of the world.  The patient has seen their dentist in the last six month. They have seen their eye doctor in the last year. They admit to slight hearing difficulty with regard to whispered voices and some television programs.  They have deferred audiologic testing in the last year.  They do not  have excessive sun exposure. Discussed the need for sun protection: hats, long sleeves and use of sunscreen if there is significant sun exposure.   Diet: the importance of a healthy diet is discussed. They do have a healthy diet.  The benefits of regular aerobic exercise were discussed. He works out 4 to 5 times per week for 60 minutes.    Depression screen: there are no signs or vegative symptoms of depression- irritability, change in appetite, anhedonia, sadness/tearfullness.  The following portions of the patient's  history were reviewed and updated as appropriate: allergies, current medications, past family history, past medical history,  past surgical history, past social history  and problem list.  Visual acuity was not assessed per patient preference since she has regular follow up with her ophthalmologist. Hearing and body mass index were assessed and reviewed.   During the course of the visit the patient was educated and counseled about appropriate screening and preventive services including : fall prevention , diabetes screening, nutrition counseling, colorectal cancer screening, and recommended immunizations.    CC: The primary encounter diagnosis was Prostate cancer screening. Diagnoses of Primary insomnia, Dyslipidemia, Acquired hypothyroidism, Prediabetes, Routine physical examination, and S/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 were also pertinent to this visit.  History [name] has a past medical history of Congenital heart valve abnormality, Dysrhythmia, GERD (gastroesophageal reflux disease), Hypercholesterolemia, and PONV (postoperative nausea and vomiting).   He has a past surgical history that includes Inguinal hernia repair (2006); Tonsillectomy (2001); Anterior cruciate ligament repair (Right, 08/01/2014); TEE without cardioversion (N/A, 08/01/2014); coronary angiogram (10/26/2014); Cardiac catheterization; Bentall procedure (N/A, 11/27/2014); TEE without cardioversion (N/A, 11/27/2014); and Colonoscopy (2010).   His family history includes Arthritis in his mother; Heart disease in his father.He reports that he has never smoked. He has never used smokeless tobacco. He reports current alcohol use of about 7.0 standard drinks per week. He reports that he does not use drugs.  Outpatient Medications Prior to Visit  Medication Sig Dispense Refill   ALPRAZolam (XANAX) 0.5 MG tablet TAKE 1  TABLET BY MOUTH EVERY DAY AT BEDTIME AS NEEDED 30 tablet 0   Ascorbic Acid (VITAMIN C) 1000 MG tablet  Take 1,000 mg by mouth daily.     benzonatate (TESSALON) 100 MG capsule Take 1 capsule (100 mg total) by mouth 3 (three) times daily as needed for cough. 30 capsule 0   hydrochlorothiazide (HYDRODIURIL) 12.5 MG tablet TAKE 1 TABLET BY MOUTH  DAILY 90 tablet 3   levothyroxine (SYNTHROID) 50 MCG tablet TAKE 1 TABLET BY MOUTH  DAILY BEFORE BREAKFAST 90 tablet 3   losartan (COZAAR) 50 MG tablet TAKE 1 TABLET BY MOUTH  DAILY 90 tablet 3   metoprolol tartrate (LOPRESSOR) 25 MG tablet TAKE 1 TABLET BY MOUTH  TWICE DAILY 180 tablet 3   rosuvastatin (CRESTOR) 20 MG tablet TAKE 2 TABLETS BY MOUTH  DAILY 180 tablet 7   warfarin (COUMADIN) 5 MG tablet TAKE 1/2 TO 1 TABLET BY  MOUTH DAILY AS DIRECTED BY  COUMADIN CLINIC 90 tablet 1   amoxicillin-clavulanate (AUGMENTIN) 875-125 MG tablet Take 1 tablet by mouth 2 (two) times daily. 14 tablet 0   No facility-administered medications prior to visit.    Review of Systems  Patient denies headache, fevers, malaise, unintentional weight loss, skin rash, eye pain, sinus congestion and sinus pain, sore throat, dysphagia,  hemoptysis , cough, dyspnea, wheezing, chest pain, palpitations, orthopnea, edema, abdominal pain, nausea, melena, diarrhea, constipation, flank pain, dysuria, hematuria, urinary  Frequency, nocturia, numbness, tingling, seizures,  Focal weakness, Loss of consciousness,  Tremor, insomnia, depression, anxiety, and suicidal ideation.    Objective:  BP 122/80   Pulse (!) 59   Temp (!) 95.9 F (35.5 C)   Ht 5' 8.5" (1.74 m)   Wt 174 lb 12.8 oz (79.3 kg)   SpO2 98%   BMI 26.19 kg/m   Physical Exam  General appearance: alert, cooperative and appears stated age Ears: normal TM's and external ear canals both ears Throat: lips, mucosa, and tongue normal; teeth and gums normal Neck: no adenopathy, no carotid bruit, supple, symmetrical, trachea midline and thyroid not enlarged, symmetric, no tenderness/mass/nodules Back: symmetric, no curvature.  ROM normal. No CVA tenderness. Lungs: clear to auscultation bilaterally Heart: regular rate and rhythm, S1, S2 normal, no murmur, click, rub or gallop Abdomen: soft, non-tender; bowel sounds normal; no masses,  no organomegaly Pulses: 2+ and symmetric Skin: Skin color, texture, turgor normal. No rashes or lesions Lymph nodes: Cervical, supraclavicular, and axillary nodes normal.   Assessment & Plan:   Problem List Items Addressed This Visit       Unprioritized   Acquired hypothyroidism    .Thyroid function is due.  He has no symptoms  of under or overactive thyroid   Lab Results  Component Value Date   TSH 3.240 05/31/2020         Relevant Orders   TSH   Dyslipidemia    Managed with Crestor for CAD noted during workup for AVR. repeat lipids are due   Lab Results  Component Value Date   CHOL 164 03/08/2020   HDL 43 03/08/2020   LDLCALC 95 03/08/2020   TRIG 145 03/08/2020   CHOLHDL 4.0 09/21/2019   Lab Results  Component Value Date   ALT 29 05/31/2020   AST 28 05/31/2020   ALKPHOS 73 05/31/2020   BILITOT 0.3 05/31/2020         Relevant Orders   Lipid panel   Insomnia    Managed with low dose benzodiazepine. He is aware of  the risk of chronic benzodiazepine use and does not use alprazolam every night;  Alternates 1/2  tablet  with dinnertime dosing of  melatonin      Routine physical examination    age appropriate education and counseling updated, referrals for preventative services and immunizations addressed, dietary and smoking counseling addressed, most recent labs reviewed.  I have personally reviewed and have noted:   1) the patient's medical and social history 2) The pt's use of alcohol, tobacco, and illicit drugs 3) The patient's current medications and supplements 4) Functional ability including ADL's, fall risk, home safety risk, hearing and visual impairment 5) Diet and physical activities 6) Evidence for depression or mood disorder 7) The  patient's height, weight, and BMI have been recorded in the chart   I have made referrals, and provided counseling and education based on review of the above      S/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    His last ECHO was in 2021 and noted normal EF, mildly elevated LV end diastolic pressures       Other Visit Diagnoses     Prostate cancer screening    -  Primary   Relevant Orders   PSA   Prediabetes       Relevant Orders   Comprehensive metabolic panel   Hemoglobin A1c       I have discontinued Stephannie Li. Erazo's amoxicillin-clavulanate. I am also having him maintain his vitamin C, rosuvastatin, levothyroxine, metoprolol tartrate, warfarin, benzonatate, hydrochlorothiazide, losartan, and ALPRAZolam.  No orders of the defined types were placed in this encounter.   Medications Discontinued During This Encounter  Medication Reason   amoxicillin-clavulanate (AUGMENTIN) 875-125 MG tablet     Follow-up: Return in about 6 months (around 11/29/2021) for alprazolam refill,  hypertension follow up .   [name], MD

## 2021-06-02 NOTE — Assessment & Plan Note (Signed)

## 2021-06-02 NOTE — Assessment & Plan Note (Signed)
Managed with Crestor for CAD noted during workup for AVR. repeat lipids are due   Lab Results  Component Value Date   CHOL 164 03/08/2020   HDL 43 03/08/2020   LDLCALC 95 03/08/2020   TRIG 145 03/08/2020   CHOLHDL 4.0 09/21/2019   Lab Results  Component Value Date   ALT 29 05/31/2020   AST 28 05/31/2020   ALKPHOS 73 05/31/2020   BILITOT 0.3 05/31/2020

## 2021-06-02 NOTE — Assessment & Plan Note (Signed)
His last ECHO was in 2021 and noted normal EF, mildly elevated LV end diastolic pressures

## 2021-06-02 NOTE — Assessment & Plan Note (Signed)
.  Thyroid function is due.  He has no symptoms  of under or overactive thyroid   Lab Results  Component Value Date   TSH 3.240 05/31/2020

## 2021-06-12 LAB — LIPID PANEL
Cholesterol: 154 (ref 0–200)
HDL: 42 (ref 35–70)
Triglycerides: 115 (ref 40–160)

## 2021-06-12 LAB — PROTIME-INR: INR: 2.2 — AB (ref 0.9–1.1)

## 2021-06-13 LAB — LIPID PANEL
Chol/HDL Ratio: 4 ratio (ref 0.0–5.0)
Cholesterol, Total: 174 mg/dL (ref 100–199)
HDL: 43 mg/dL (ref >39)
LDL Chol Calc (NIH): 105 mg/dL — ABNORMAL HIGH (ref 0–99)
Triglycerides: 149 mg/dL (ref 0–149)
VLDL Cholesterol Cal: 26 mg/dL (ref 5–40)

## 2021-06-13 LAB — TSH: TSH: 3.38 u[IU]/mL (ref 0.450–4.500)

## 2021-06-13 LAB — COMPREHENSIVE METABOLIC PANEL
ALT: 29 IU/L (ref 0–44)
AST: 35 IU/L (ref 0–40)
Albumin/Globulin Ratio: 2 (ref 1.2–2.2)
Albumin: 4.8 g/dL (ref 3.8–4.9)
Alkaline Phosphatase: 66 IU/L (ref 44–121)
BUN/Creatinine Ratio: 14 (ref 9–20)
BUN: 17 mg/dL (ref 6–24)
Bilirubin Total: 0.3 mg/dL (ref 0.0–1.2)
CO2: 23 mmol/L (ref 20–29)
Calcium: 10 mg/dL (ref 8.7–10.2)
Chloride: 98 mmol/L (ref 96–106)
Creatinine, Ser: 1.23 mg/dL (ref 0.76–1.27)
Globulin, Total: 2.4 g/dL (ref 1.5–4.5)
Glucose: 90 mg/dL (ref 70–99)
Potassium: 4.3 mmol/L (ref 3.5–5.2)
Sodium: 138 mmol/L (ref 134–144)
Total Protein: 7.2 g/dL (ref 6.0–8.5)
eGFR: 69 mL/min/{1.73_m2} (ref >59)

## 2021-06-13 LAB — PSA: Prostate Specific Ag, Serum: 0.5 ng/mL (ref 0.0–4.0)

## 2021-06-17 ENCOUNTER — Encounter: Payer: Self-pay | Admitting: Family Medicine

## 2021-06-17 ENCOUNTER — Telehealth: Payer: Managed Care, Other (non HMO) | Admitting: Family Medicine

## 2021-06-17 ENCOUNTER — Ambulatory Visit (INDEPENDENT_AMBULATORY_CARE_PROVIDER_SITE_OTHER): Payer: Managed Care, Other (non HMO) | Admitting: Cardiology

## 2021-06-17 DIAGNOSIS — U071 COVID-19: Secondary | ICD-10-CM

## 2021-06-17 DIAGNOSIS — Z5181 Encounter for therapeutic drug level monitoring: Secondary | ICD-10-CM

## 2021-06-17 DIAGNOSIS — Z954 Presence of other heart-valve replacement: Secondary | ICD-10-CM

## 2021-06-17 DIAGNOSIS — R051 Acute cough: Secondary | ICD-10-CM | POA: Diagnosis not present

## 2021-06-17 MED ORDER — FLUTICASONE PROPIONATE 50 MCG/ACT NA SUSP
2.0000 | Freq: Every day | NASAL | 0 refills | Status: DC
Start: 2021-06-17 — End: 2023-06-09

## 2021-06-17 MED ORDER — MOLNUPIRAVIR EUA 200MG CAPSULE: 4.0000 | ORAL_CAPSULE | Freq: Two times a day (BID) | ORAL | 0 refills | Status: AC

## 2021-06-17 MED ORDER — PROMETHAZINE-DM 6.25-15 MG/5ML PO SYRP: 2.5000 mL | ORAL_SOLUTION | Freq: Four times a day (QID) | ORAL | 0 refills | Status: DC | PRN

## 2021-06-17 NOTE — Telephone Encounter (Signed)
Noted  

## 2021-06-17 NOTE — Progress Notes (Signed)
Virtual Visit Consent   Vonn Sliger [facility], you are scheduled for a virtual visit with a [facility] provider today.     Just as with appointments in the office, your consent must be obtained to participate.  Your consent will be active for this visit and any virtual visit you may have with one of our providers in the next 365 days.     If you have a MyChart account, a copy of this consent can be sent to you electronically.  All virtual visits are billed to your insurance company just like a traditional visit in the office.    As this is a virtual visit, video technology does not allow for your provider to perform a traditional examination.  This may limit your provider's ability to fully assess your condition.  If your provider identifies any concerns that need to be evaluated in person or the need to arrange testing (such as labs, EKG, etc.), we will make arrangements to do so.     Although advances in technology are sophisticated, we cannot ensure that it will always work on either your end or our end.  If the connection with a video visit is poor, the visit may have to be switched to a telephone visit.  With either a video or telephone visit, we are not always able to ensure that we have a secure connection.     I need to obtain your verbal consent now.   Are you willing to proceed with your visit today?    Frank [name] has provided verbal consent on 06/17/2021 for a virtual visit (video or telephone).   Perlie Mayo, NP   Date: 06/17/2021 7:32 AM   Virtual Visit via Video Note   I, Perlie Mayo, connected with  Frank [name]  ([id number], Jun 16, 1966) on 06/17/21 at  7:30 AM EDT by a video-enabled telemedicine application and verified that I am speaking with the correct person using two identifiers.  Location: Patient: Virtual Visit Location Patient: Home Provider: Virtual Visit Location Provider: Home Office   I discussed the limitations of evaluation and management by  telemedicine and the availability of in person appointments. The patient expressed understanding and agreed to proceed.    History of Present Illness: Frank [name] is a 55 y.o. who identifies as a male who was assigned male at birth, and is being seen today for covid +. Symptoms- started Saturday evening- included moderate cough-was dry to now mucus production- scant in amount. Headache, and body aches. No fevers, chills, shortness of breath, chest pain, ear or throat pain. Unknown sick contacts. Did travel last week Thursday- out of state- was around a large group of people at that time.   Problems:  Patient Active Problem List   Diagnosis Date Noted   Gastrocnemius muscle tear, left, subsequent encounter 06/01/2020   Fatigue 06/01/2020   COVID-19 vaccine series completed 06/01/2020   Heart murmur 05/15/2020   Gynecomastia, male 05/25/2019   Acquired hypothyroidism 11/12/2017   Long term (current) use of anticoagulants 08/29/2017   Rectal bleeding 06/11/2017   Erectile dysfunction 05/16/2017   CAD in native artery 07/02/2015   Dyslipidemia 07/02/2015   Overweight (BMI 25.0-29.9) 05/06/2015   Essential hypertension 05/04/2015   S/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 38/25/0539   Encounter for therapeutic drug monitoring 12/04/2014   S/P ascending aortic replacement 11/27/2014   S/P ACL repair 05/04/2014   SVT (supraventricular tachycardia) ([place]) 05/01/2014   Palpitations 05/01/2014   Routine  physical examination 04/22/2013   Hx of bicuspid aortic valve 04/20/2013   Screening for prostate cancer 04/17/2012   Insomnia 04/15/2012   Esophageal reflux 04/09/2011   Hx of inguinal hernia surgery 04/09/2011   Other malaise and fatigue 04/09/2011   Other and unspecified hyperlipidemia 04/09/2011    Allergies: No Known Allergies Medications:  Current Outpatient Medications:    ALPRAZolam (XANAX) 0.5 MG tablet, TAKE 1 TABLET BY MOUTH EVERY DAY AT BEDTIME AS NEEDED, Disp: 30  tablet, Rfl: 0   Ascorbic Acid (VITAMIN C) 1000 MG tablet, Take 1,000 mg by mouth daily., Disp: , Rfl:    benzonatate (TESSALON) 100 MG capsule, Take 1 capsule (100 mg total) by mouth 3 (three) times daily as needed for cough., Disp: 30 capsule, Rfl: 0   hydrochlorothiazide (HYDRODIURIL) 12.5 MG tablet, TAKE 1 TABLET BY MOUTH  DAILY, Disp: 90 tablet, Rfl: 3   levothyroxine (SYNTHROID) 50 MCG tablet, TAKE 1 TABLET BY MOUTH  DAILY BEFORE BREAKFAST, Disp: 90 tablet, Rfl: 3   losartan (COZAAR) 50 MG tablet, TAKE 1 TABLET BY MOUTH  DAILY, Disp: 90 tablet, Rfl: 3   metoprolol tartrate (LOPRESSOR) 25 MG tablet, TAKE 1 TABLET BY MOUTH  TWICE DAILY, Disp: 180 tablet, Rfl: 3   rosuvastatin (CRESTOR) 20 MG tablet, TAKE 2 TABLETS BY MOUTH  DAILY, Disp: 180 tablet, Rfl: 7   warfarin (COUMADIN) 5 MG tablet, TAKE 1/2 TO 1 TABLET BY  MOUTH DAILY AS DIRECTED BY  COUMADIN CLINIC, Disp: 90 tablet, Rfl: 1  Observations/Objective: Patient is well-developed, well-nourished in no acute distress.  Resting comfortably  at home.  Head is normocephalic, atraumatic.  No labored breathing.  Speech is clear and coherent with logical content.  Patient is alert and oriented at baseline.  Cough appreciated   Assessment and Plan: 1. COVID-19 - + HT  - will upload photo  -symptom management with antiviral desired -scripts below provided -OTC and info reviewed and on AVS   -- molnupiravir EUA (LAGEVRIO) 200 mg CAPS capsule; Take 4 capsules (800 mg total) by mouth 2 (two) times daily for 5 days.  Dispense: 40 capsule; Refill: 0 - promethazine-dextromethorphan (PROMETHAZINE-DM) 6.25-15 MG/5ML syrup; Take 2.5 mLs by mouth 4 (four) times daily as needed for cough.  Dispense: 118 mL; Refill: 0 - fluticasone (FLONASE) 50 MCG/ACT nasal spray; Place 2 sprays into both nostrils daily.  Dispense: 16 g; Refill: 0  2. Acute cough  - promethazine-dextromethorphan (PROMETHAZINE-DM) 6.25-15 MG/5ML syrup; Take 2.5 mLs by mouth 4  (four) times daily as needed for cough.  Dispense: 118 mL; Refill: 0 - fluticasone (FLONASE) 50 MCG/ACT nasal spray; Place 2 sprays into both nostrils daily.  Dispense: 16 g; Refill: 0   Reviewed side effects, risks and benefits of medication.    Patient acknowledged agreement and understanding of the plan.    I discussed the assessment and treatment plan with the patient. The patient was provided an opportunity to ask questions and all were answered. The patient agreed with the plan and demonstrated an understanding of the instructions.   The patient was advised to call back or seek an in-person evaluation if the symptoms worsen or if the condition fails to improve as anticipated.   The above assessment and management plan was discussed with the patient. The patient verbalized understanding of and has agreed to the management plan. Patient is aware to call the clinic if symptoms persist or worsen. Patient is aware when to return to the clinic for a follow-up visit. Patient  educated on when it is appropriate to go to the emergency department.   Follow Up Instructions: I discussed the assessment and treatment plan with the patient. The patient was provided an opportunity to ask questions and all were answered. The patient agreed with the plan and demonstrated an understanding of the instructions.  A copy of instructions were sent to the patient via MyChart unless otherwise noted below.   The patient was advised to call back or seek an in-person evaluation if the symptoms worsen or if the condition fails to improve as anticipated.  Time:  I spent 10 minutes with the patient via telehealth technology discussing the above problems/concerns.    Perlie Mayo, NP

## 2021-06-17 NOTE — Patient Instructions (Signed)
I appreciate the opportunity to provide you with care for your health and wellness.  Please continue to practice social distancing to keep you, your family, and our community safe.  If you must go out, please wear a mask and practice good handwashing.  Have a wonderful day. With Gratitude, Makynzie Dobesh, DNP, AGNP-BC  Please keep well-hydrated and get plenty of rest. Start a saline nasal rinse to flush out your nasal passages. You can use plain Mucinex to help thin congestion. If you have a humidifier, running in the bedroom at night. I want you to start OTC vitamin D3 1000 units daily, vitamin C 1000 mg daily, and a zinc supplement. Please take prescribed medications as directed.  You have been enrolled in a MyChart symptom monitoring program. Please answer these questions daily so we can keep track of how you are doing.  You were to quarantine for 5 days from onset of your symptoms.  After day 5, if you have had no fever and you are feeling better, you can end quarantine but need to mask for an additional 5 days. After day 5 if you have a fever or are having significant symptoms, please quarantine for full 10 days.  If you note any worsening of symptoms, any significant shortness of breath or any chest pain, please seek ER evaluation ASAP.  Please do not delay care!  COVID-19: What to Do if You Are Sick If you test positive and are an older adult or someone who is at high risk of getting very sick from COVID-19, treatment may be available. Contact a healthcare provider right away after a positive test to determine if you are eligible, even if your symptoms are mild right now. You can also visit a Test to Treat location and, if eligible, receive a prescription from a provider. Don't delay: Treatment must be started within the first few days to be effective. If you have a fever, cough, or other symptoms, you might have COVID-19. Most people have mild illness and are able to recover at home.  If you are sick: Keep track of your symptoms. If you have an emergency warning sign (including trouble breathing), call 911. Steps to help prevent the spread of COVID-19 if you are sick If you are sick with COVID-19 or think you might have COVID-19, follow the steps below to care for yourself and to help protect other people in your home and community. Stay home except to get medical care Stay home. Most people with COVID-19 have mild illness and can recover at home without medical care. Do not leave your home, except to get medical care. Do not visit public areas and do not go to places where you are unable to wear a mask. Take care of yourself. Get rest and stay hydrated. Take over-the-counter medicines, such as acetaminophen, to help you feel better. Stay in touch with your doctor. Call before you get medical care. Be sure to get care if you have trouble breathing, or have any other emergency warning signs, or if you think it is an emergency. Avoid public transportation, ride-sharing, or taxis if possible. Get tested If you have symptoms of COVID-19, get tested. While waiting for test results, stay away from others, including staying apart from those living in your household. Get tested as soon as possible after your symptoms start. Treatments may be available for people with COVID-19 who are at risk for becoming very sick. Don't delay: Treatment must be started early to be effective--some treatments must   begin within 5 days of your first symptoms. Contact your healthcare provider right away if your test result is positive to determine if you are eligible. Self-tests are one of several options for testing for the virus that causes COVID-19 and may be more convenient than laboratory-based tests and point-of-care tests. Ask your healthcare provider or your local health department if you need help interpreting your test results. You can visit your state, tribal, local, and territorial health  department's website to look for the latest local information on testing sites. Separate yourself from other people As much as possible, stay in a specific room and away from other people and pets in your home. If possible, you should use a separate bathroom. If you need to be around other people or animals in or outside of the home, wear a well-fitting mask. Tell your close contacts that they may have been exposed to COVID-19. An infected person can spread COVID-19 starting 48 hours (or 2 days) before the person has any symptoms or tests positive. By letting your close contacts know they may have been exposed to COVID-19, you are helping to protect everyone. See COVID-19 and Animals if you have questions about pets. If you are diagnosed with COVID-19, someone from the health department may call you. Answer the call to slow the spread. Monitor your symptoms Symptoms of COVID-19 include fever, cough, or other symptoms. Follow care instructions from your healthcare provider and local health department. Your local health authorities may give instructions on checking your symptoms and reporting information. When to seek emergency medical attention Look for emergency warning signs* for COVID-19. If someone is showing any of these signs, seek emergency medical care immediately: Trouble breathing Persistent pain or pressure in the chest New confusion Inability to wake or stay awake Pale, gray, or blue-colored skin, lips, or nail beds, depending on skin tone *This list is not all possible symptoms. Please call your medical provider for any other symptoms that are severe or concerning to you. Call 911 or call ahead to your local emergency facility: Notify the operator that you are seeking care for someone who has or may have COVID-19. Call ahead before visiting your doctor Call ahead. Many medical visits for routine care are being postponed or done by phone or telemedicine. If you have a medical  appointment that cannot be postponed, call your doctor's office, and tell them you have or may have COVID-19. This will help the office protect themselves and other patients. If you are sick, wear a well-fitting mask You should wear a mask if you must be around other people or animals, including pets (even at home). Wear a mask with the best fit, protection, and comfort for you. You don't need to wear the mask if you are alone. If you can't put on a mask (because of trouble breathing, for example), cover your coughs and sneezes in some other way. Try to stay at least 6 feet away from other people. This will help protect the people around you. Masks should not be placed on young children under age 2 years, anyone who has trouble breathing, or anyone who is not able to remove the mask without help. Cover your coughs and sneezes Cover your mouth and nose with a tissue when you cough or sneeze. Throw away used tissues in a lined trash can. Immediately wash your hands with soap and water for at least 20 seconds. If soap and water are not available, clean your hands with an alcohol-based hand sanitizer   that contains at least 60% alcohol. Clean your hands often Wash your hands often with soap and water for at least 20 seconds. This is especially important after blowing your nose, coughing, or sneezing; going to the bathroom; and before eating or preparing food. Use hand sanitizer if soap and water are not available. Use an alcohol-based hand sanitizer with at least 60% alcohol, covering all surfaces of your hands and rubbing them together until they feel dry. Soap and water are the best option, especially if hands are visibly dirty. Avoid touching your eyes, nose, and mouth with unwashed hands. Handwashing Tips Avoid sharing personal household items Do not share dishes, drinking glasses, cups, eating utensils, towels, or bedding with other people in your home. Wash these items thoroughly after using them  with soap and water or put in the dishwasher. Clean surfaces in your home regularly Clean and disinfect high-touch surfaces (for example, doorknobs, tables, handles, light switches, and countertops) in your "sick room" and bathroom. In shared spaces, you should clean and disinfect surfaces and items after each use by the person who is ill. If you are sick and cannot clean, a caregiver or other person should only clean and disinfect the area around you (such as your bedroom and bathroom) on an as needed basis. Your caregiver/other person should wait as long as possible (at least several hours) and wear a mask before entering, cleaning, and disinfecting shared spaces that you use. Clean and disinfect areas that may have blood, stool, or body fluids on them. Use household cleaners and disinfectants. Clean visible dirty surfaces with household cleaners containing soap or detergent. Then, use a household disinfectant. Use a product from EPA's List N: Disinfectants for Coronavirus (COVID-19). Be sure to follow the instructions on the label to ensure safe and effective use of the product. Many products recommend keeping the surface wet with a disinfectant for a certain period of time (look at "contact time" on the product label). You may also need to wear personal protective equipment, such as gloves, depending on the directions on the product label. Immediately after disinfecting, wash your hands with soap and water for 20 seconds. For completed guidance on cleaning and disinfecting your home, visit Complete Disinfection Guidance. Take steps to improve ventilation at home Improve ventilation (air flow) at home to help prevent from spreading COVID-19 to other people in your household. Clear out COVID-19 virus particles in the air by opening windows, using air filters, and turning on fans in your home. Use this interactive tool to learn how to improve air flow in your home. When you can be around others after  being sick with COVID-19 Deciding when you can be around others is different for different situations. Find out when you can safely end home isolation. For any additional questions about your care, contact your healthcare provider or state or local health department. 11/06/2020 Content source: National Center for Immunization and Respiratory Diseases (NCIRD), Division of Viral Diseases This information is not intended to replace advice given to you by your health care provider. Make sure you discuss any questions you have with your health care provider. Document Revised: 12/20/2020 Document Reviewed: 12/20/2020 Elsevier Patient Education  2022 Else[employer] Inc.       

## 2021-06-23 ENCOUNTER — Other Ambulatory Visit: Payer: Self-pay | Admitting: Internal Medicine

## 2021-06-29 ENCOUNTER — Other Ambulatory Visit: Payer: Self-pay | Admitting: Internal Medicine

## 2021-07-01 NOTE — Telephone Encounter (Signed)
RX Refill: Xanax Last Seen: 05-31-21 Last Ordered: 05-29-21 Next Appt: 11-29-21

## 2021-07-04 ENCOUNTER — Other Ambulatory Visit: Payer: Self-pay | Admitting: Internal Medicine

## 2021-07-24 LAB — POCT INR: INR: 2 (ref 2.0–3.0)

## 2021-07-29 ENCOUNTER — Ambulatory Visit (INDEPENDENT_AMBULATORY_CARE_PROVIDER_SITE_OTHER): Payer: Managed Care, Other (non HMO)

## 2021-07-29 DIAGNOSIS — Z954 Presence of other heart-valve replacement: Secondary | ICD-10-CM

## 2021-07-29 DIAGNOSIS — Z5181 Encounter for therapeutic drug level monitoring: Secondary | ICD-10-CM | POA: Diagnosis not present

## 2021-07-29 NOTE — Patient Instructions (Signed)
Spoke w/ pt  advised him to continue with 1/2 tablet daily except 1 tablet each Mon and Fri. Recheck INR in 6 weeks.

## 2021-08-01 ENCOUNTER — Other Ambulatory Visit: Payer: Self-pay | Admitting: Internal Medicine

## 2021-08-28 ENCOUNTER — Telehealth: Payer: Self-pay | Admitting: Internal Medicine

## 2021-08-28 NOTE — Telephone Encounter (Signed)
Called patient . Informed patient there is an order for  an Echo for March 2023 per Dr Debara Pickett orders from last office note.  RN informed patient ,will send a message to echo scheduler to contact patient to schedule Echo in March 2023.  RN informed patient if he does not receive call by next week  contact scheduler. Patient verbalized understanding

## 2021-08-28 NOTE — Telephone Encounter (Signed)
Pt sent this via Mychart message to the scheduling pool:    " Annual follow-up/checkup Also need imaging group to schedule 3-year ultrasound prior to this appt"  He has been scheduled for 5/16 at 8 am.  He is requesting a order for the ultrasound be put in before his appt in may .  Thank you!!

## 2021-09-09 LAB — PROTIME-INR: INR: 2.6 — AB (ref 0.9–1.1)

## 2021-09-10 ENCOUNTER — Ambulatory Visit (INDEPENDENT_AMBULATORY_CARE_PROVIDER_SITE_OTHER): Payer: Managed Care, Other (non HMO) | Admitting: Cardiology

## 2021-09-10 ENCOUNTER — Encounter: Payer: Self-pay | Admitting: Internal Medicine

## 2021-09-10 ENCOUNTER — Telehealth: Payer: Self-pay

## 2021-09-10 DIAGNOSIS — Z5181 Encounter for therapeutic drug level monitoring: Secondary | ICD-10-CM

## 2021-09-10 NOTE — Telephone Encounter (Signed)
Lmom for overdue inr 

## 2021-10-20 ENCOUNTER — Other Ambulatory Visit: Payer: Self-pay | Admitting: Internal Medicine

## 2021-10-20 DIAGNOSIS — Z954 Presence of other heart-valve replacement: Secondary | ICD-10-CM

## 2021-10-21 ENCOUNTER — Telehealth: Payer: Managed Care, Other (non HMO) | Admitting: Physician Assistant

## 2021-10-21 DIAGNOSIS — J019 Acute sinusitis, unspecified: Secondary | ICD-10-CM | POA: Diagnosis not present

## 2021-10-21 DIAGNOSIS — B9689 Other specified bacterial agents as the cause of diseases classified elsewhere: Secondary | ICD-10-CM

## 2021-10-21 MED ORDER — PROMETHAZINE-DM 6.25-15 MG/5ML PO SYRP: 5.0000 mL | ORAL_SOLUTION | Freq: Four times a day (QID) | ORAL | 0 refills | Status: DC | PRN

## 2021-10-21 MED ORDER — BENZONATATE 100 MG PO CAPS: 100.0000 mg | ORAL_CAPSULE | Freq: Three times a day (TID) | ORAL | 0 refills | Status: DC | PRN

## 2021-10-21 MED ORDER — PREDNISONE 20 MG PO TABS: 40.0000 mg | ORAL_TABLET | Freq: Every day | ORAL | 0 refills | Status: DC

## 2021-10-21 MED ORDER — AMOXICILLIN-POT CLAVULANATE 875-125 MG PO TABS: 1.0000 | ORAL_TABLET | Freq: Two times a day (BID) | ORAL | 0 refills | Status: DC

## 2021-10-21 NOTE — Progress Notes (Signed)
?Virtual Visit Consent  ? ?Frank [name], you are scheduled for a virtual visit with a [facility] provider today.   ?  ?Just as with appointments in the office, your consent must be obtained to participate.  Your consent will be active for this visit and any virtual visit you may have with one of our providers in the next 365 days.   ?  ?If you have a MyChart account, a copy of this consent can be sent to you electronically.  All virtual visits are billed to your insurance company just like a traditional visit in the office.   ? ?As this is a virtual visit, video technology does not allow for your provider to perform a traditional examination.  This may limit your provider's ability to fully assess your condition.  If your provider identifies any concerns that need to be evaluated in person or the need to arrange testing (such as labs, EKG, etc.), we will make arrangements to do so.   ?  ?Although advances in technology are sophisticated, we cannot ensure that it will always work on either your end or our end.  If the connection with a video visit is poor, the visit may have to be switched to a telephone visit.  With either a video or telephone visit, we are not always able to ensure that we have a secure connection.    ? ?I need to obtain your verbal consent now.   Are you willing to proceed with your visit today?  ?  ?Nathaneil Feagans Bamberg has provided verbal consent on 10/21/2021 for a virtual visit (video or telephone). ?  ?Mar Daring, PA-C  ? ?Date: 10/21/2021 8:22 AM ? ? ?Virtual Visit via Video Note  ? ?[facility] [place], connected with  Frank [name]  ([id number], 1966/02/27) on 10/21/21 at  8:15 AM EST by a video-enabled telemedicine application and verified that I am speaking with the correct person using two identifiers. ? ?Location: ?Patient: Virtual Visit Location Patient: Home ?Provider: Virtual Visit Location Provider: Home Office ?  ?I discussed the limitations of evaluation and management  by telemedicine and the availability of in person appointments. The patient expressed understanding and agreed to proceed.   ? ?History of Present Illness: ?Frank [name] is a 56 y.o. who identifies as a male who was assigned male at birth, and is being seen today for URI symptoms. ? ?HPI: URI  ?This is a new problem. The current episode started 1 to 4 weeks ago (worsened Friday). The problem has been gradually worsening. Maximum temperature: 99 at night. Associated symptoms include congestion (thick), coughing (x 4 days), headaches, rhinorrhea (post nasal drainage), sinus pain, sneezing, a sore throat (in the beginning, now resolved) and wheezing (higher, more stridor described). Pertinent negatives include no diarrhea, ear pain, nausea, plugged ear sensation or vomiting. He has tried acetaminophen (Mucinex) for the symptoms. The treatment provided no relief.   ?Covid home testing has been negative. ? ?Problems:  ?Patient Active Problem List  ? Diagnosis Date Noted  ? Gastrocnemius muscle tear, left, subsequent encounter 06/01/2020  ? Fatigue 06/01/2020  ? COVID-19 vaccine series completed 06/01/2020  ? Heart murmur 05/15/2020  ? Gynecomastia, male 05/25/2019  ? Acquired hypothyroidism 11/12/2017  ? Long term (current) use of anticoagulants 08/29/2017  ? Rectal bleeding 06/11/2017  ? Erectile dysfunction 05/16/2017  ? CAD in native artery 07/02/2015  ? Dyslipidemia 07/02/2015  ? Overweight (BMI 25.0-29.9) 05/06/2015  ? Essential hypertension 05/04/2015  ?  S/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 94/70/9628  ? Encounter for therapeutic drug monitoring 12/04/2014  ? S/P ascending aortic replacement 11/27/2014  ? S/P ACL repair 05/04/2014  ? SVT (supraventricular tachycardia) ([facility]) 05/01/2014  ? Palpitations 05/01/2014  ? Routine physical examination 04/22/2013  ? Hx of bicuspid aortic valve 04/20/2013  ? Screening for prostate cancer 04/17/2012  ? Insomnia 04/15/2012  ? Esophageal reflux 04/09/2011  ? Hx  of inguinal hernia surgery 04/09/2011  ? Other malaise and fatigue 04/09/2011  ? Other and unspecified hyperlipidemia 04/09/2011  ?  ?Allergies: No Known Allergies ?Medications:  ?Current Outpatient Medications:  ?  amoxicillin-clavulanate (AUGMENTIN) 875-125 MG tablet, Take 1 tablet by mouth 2 (two) times daily., Disp: 20 tablet, Rfl: 0 ?  benzonatate (TESSALON) 100 MG capsule, Take 1 capsule (100 mg total) by mouth 3 (three) times daily as needed., Disp: 30 capsule, Rfl: 0 ?  predniSONE (DELTASONE) 20 MG tablet, Take 2 tablets (40 mg total) by mouth daily with breakfast., Disp: 10 tablet, Rfl: 0 ?  promethazine-dextromethorphan (PROMETHAZINE-DM) 6.25-15 MG/5ML syrup, Take 5 mLs by mouth 4 (four) times daily as needed., Disp: 118 mL, Rfl: 0 ?  ALPRAZolam (XANAX) 0.5 MG tablet, TAKE 1 TABLET BY MOUTH EVERY DAY AT BEDTIME AS NEEDED, Disp: 30 tablet, Rfl: 5 ?  Ascorbic Acid (VITAMIN C) 1000 MG tablet, Take 1,000 mg by mouth daily., Disp: , Rfl:  ?  fluticasone (FLONASE) 50 MCG/ACT nasal spray, Place 2 sprays into both nostrils daily., Disp: 16 g, Rfl: 0 ?  hydrochlorothiazide (HYDRODIURIL) 12.5 MG tablet, TAKE 1 TABLET BY MOUTH  DAILY, Disp: 90 tablet, Rfl: 3 ?  levothyroxine (SYNTHROID) 50 MCG tablet, TAKE 1 TABLET BY MOUTH  DAILY BEFORE BREAKFAST, Disp: 90 tablet, Rfl: 3 ?  losartan (COZAAR) 50 MG tablet, TAKE 1 TABLET BY MOUTH  DAILY, Disp: 90 tablet, Rfl: 3 ?  metoprolol tartrate (LOPRESSOR) 25 MG tablet, TAKE 1 TABLET BY MOUTH  TWICE DAILY, Disp: 180 tablet, Rfl: 3 ?  rosuvastatin (CRESTOR) 20 MG tablet, TAKE 2 TABLETS BY MOUTH  DAILY, Disp: 180 tablet, Rfl: 3 ?  warfarin (COUMADIN) 5 MG tablet, TAKE ONE-HALF TO 1 TABLET  BY MOUTH DAILY AS DIRECTED  BY COUMADIN CLINIC, Disp: 90 tablet, Rfl: 3 ? ?Observations/Objective: ?Patient is well-developed, well-nourished in no acute distress.  ?Resting comfortably at home.  ?Head is normocephalic, atraumatic.  ?No labored breathing.  ?Speech is clear and coherent with  logical content.  ?Patient is alert and oriented at baseline.  ? ? ?Assessment and Plan: ?1. Acute bacterial sinusitis ?- amoxicillin-clavulanate (AUGMENTIN) 875-125 MG tablet; Take 1 tablet by mouth 2 (two) times daily.  Dispense: 20 tablet; Refill: 0 ?- predniSONE (DELTASONE) 20 MG tablet; Take 2 tablets (40 mg total) by mouth daily with breakfast.  Dispense: 10 tablet; Refill: 0 ?- promethazine-dextromethorphan (PROMETHAZINE-DM) 6.25-15 MG/5ML syrup; Take 5 mLs by mouth 4 (four) times daily as needed.  Dispense: 118 mL; Refill: 0 ?- benzonatate (TESSALON) 100 MG capsule; Take 1 capsule (100 mg total) by mouth 3 (three) times daily as needed.  Dispense: 30 capsule; Refill: 0 ? ?- Worsening symptoms that have not responded to OTC medications.  ?- Will give augmentin  ?- Continue allergy medications.  ?- Stay well hydrated and get plenty of rest.  ?- Seek in person evaluation if no symptom improvement or if symptoms worsen. ? ?Follow Up Instructions: ?I discussed the assessment and treatment plan with the patient. The patient was provided an opportunity to ask questions  and all were answered. The patient agreed with the plan and demonstrated an understanding of the instructions.  A copy of instructions were sent to the patient via MyChart unless otherwise noted below.  ? ? ?The patient was advised to call back or seek an in-person evaluation if the symptoms worsen or if the condition fails to improve as anticipated. ? ?Time:  ?I spent 13 minutes with the patient via telehealth technology discussing the above problems/concerns.   ? ?Mar Daring, PA-C ?

## 2021-10-21 NOTE — Patient Instructions (Addendum)
Frank Li Lecker, thank you for joining Mar Daring, PA-C for today's virtual visit.  While this provider is not your primary care provider (PCP), if your PCP is located in our provider database this encounter information will be shared with them immediately following your visit.  Consent: (Patient) Frank [name] provided verbal consent for this virtual visit at the beginning of the encounter.  Current Medications:  Current Outpatient Medications:    amoxicillin-clavulanate (AUGMENTIN) 875-125 MG tablet, Take 1 tablet by mouth 2 (two) times daily., Disp: 20 tablet, Rfl: 0   benzonatate (TESSALON) 100 MG capsule, Take 1 capsule (100 mg total) by mouth 3 (three) times daily as needed., Disp: 30 capsule, Rfl: 0   predniSONE (DELTASONE) 20 MG tablet, Take 2 tablets (40 mg total) by mouth daily with breakfast., Disp: 10 tablet, Rfl: 0   promethazine-dextromethorphan (PROMETHAZINE-DM) 6.25-15 MG/5ML syrup, Take 5 mLs by mouth 4 (four) times daily as needed., Disp: 118 mL, Rfl: 0   ALPRAZolam (XANAX) 0.5 MG tablet, TAKE 1 TABLET BY MOUTH EVERY DAY AT BEDTIME AS NEEDED, Disp: 30 tablet, Rfl: 5   Ascorbic Acid (VITAMIN C) 1000 MG tablet, Take 1,000 mg by mouth daily., Disp: , Rfl:    fluticasone (FLONASE) 50 MCG/ACT nasal spray, Place 2 sprays into both nostrils daily., Disp: 16 g, Rfl: 0   hydrochlorothiazide (HYDRODIURIL) 12.5 MG tablet, TAKE 1 TABLET BY MOUTH  DAILY, Disp: 90 tablet, Rfl: 3   levothyroxine (SYNTHROID) 50 MCG tablet, TAKE 1 TABLET BY MOUTH  DAILY BEFORE BREAKFAST, Disp: 90 tablet, Rfl: 3   losartan (COZAAR) 50 MG tablet, TAKE 1 TABLET BY MOUTH  DAILY, Disp: 90 tablet, Rfl: 3   metoprolol tartrate (LOPRESSOR) 25 MG tablet, TAKE 1 TABLET BY MOUTH  TWICE DAILY, Disp: 180 tablet, Rfl: 3   rosuvastatin (CRESTOR) 20 MG tablet, TAKE 2 TABLETS BY MOUTH  DAILY, Disp: 180 tablet, Rfl: 3   warfarin (COUMADIN) 5 MG tablet, TAKE ONE-HALF TO 1 TABLET  BY MOUTH DAILY AS DIRECTED  BY  COUMADIN CLINIC, Disp: 90 tablet, Rfl: 3   Medications ordered in this encounter:  Meds ordered this encounter  Medications   amoxicillin-clavulanate (AUGMENTIN) 875-125 MG tablet    Sig: Take 1 tablet by mouth 2 (two) times daily.    Dispense:  20 tablet    Refill:  0    Order Specific Question:   Supervising Provider    Answer:   MILLER, BRIAN [[id number]]   predniSONE (DELTASONE) 20 MG tablet    Sig: Take 2 tablets (40 mg total) by mouth daily with breakfast.    Dispense:  10 tablet    Refill:  0    Order Specific Question:   Supervising Provider    Answer:   Noemi Chapel [[id number]]   promethazine-dextromethorphan (PROMETHAZINE-DM) 6.25-15 MG/5ML syrup    Sig: Take 5 mLs by mouth 4 (four) times daily as needed.    Dispense:  118 mL    Refill:  0    Order Specific Question:   Supervising Provider    Answer:   MILLER, BRIAN [[id number]]   benzonatate (TESSALON) 100 MG capsule    Sig: Take 1 capsule (100 mg total) by mouth 3 (three) times daily as needed.    Dispense:  30 capsule    Refill:  0    Order Specific Question:   Supervising Provider    Answer:   Sabra Heck, BRIAN [[id number]]     *If you need refills on other medications prior to  your next appointment, please contact your pharmacy*  Follow-Up: Call back or seek an in-person evaluation if the symptoms worsen or if the condition fails to improve as anticipated.  Other Instructions  Follow up with PCP or Cardiologist sooner about INR check following antibiotic use, maybe a few days after completing antibiotic   Sinusitis, Adult Sinusitis is inflammation of your sinuses. Sinuses are hollow spaces in the bones around your face. Your sinuses are located: Around your eyes. In the middle of your forehead. Behind your nose. In your cheekbones. Mucus normally drains out of your sinuses. When your nasal tissues become inflamed or swollen, mucus can become trapped or blocked. This allows bacteria, viruses, and fungi to grow, which leads to  infection. Most infections of the sinuses are caused by a virus. Sinusitis can develop quickly. It can last for up to 4 weeks (acute) or for more than 12 weeks (chronic). Sinusitis often develops after a cold. What are the causes? This condition is caused by anything that creates swelling in the sinuses or stops mucus from draining. This includes: Allergies. Asthma. Infection from bacteria or viruses. Deformities or blockages in your nose or sinuses. Abnormal growths in the nose (nasal polyps). Pollutants, such as chemicals or irritants in the air. Infection from fungi (rare). What increases the risk? You are more likely to develop this condition if you: Have a weak body defense system (immune system). Do a lot of swimming or diving. Overuse nasal sprays. Smoke. What are the signs or symptoms? The main symptoms of this condition are pain and a feeling of pressure around the affected sinuses. Other symptoms include: Stuffy nose or congestion. Thick drainage from your nose. Swelling and warmth over the affected sinuses. Headache. Upper toothache. A cough that may get worse at night. Extra mucus that collects in the throat or the back of the nose (postnasal drip). Decreased sense of smell and taste. Fatigue. A fever. Sore throat. Bad breath. How is this diagnosed? This condition is diagnosed based on: Your symptoms. Your medical history. A physical exam. Tests to find out if your condition is acute or chronic. This may include: Checking your nose for nasal polyps. Viewing your sinuses using a device that has a light (endoscope). Testing for allergies or bacteria. Imaging tests, such as an MRI or CT scan. In rare cases, a bone biopsy may be done to rule out more serious types of fungal sinus disease. How is this treated? Treatment for sinusitis depends on the cause and whether your condition is chronic or acute. If caused by a virus, your symptoms should go away on their own  within 10 days. You may be given medicines to relieve symptoms. They include: Medicines that shrink swollen nasal passages (topical intranasal decongestants). Medicines that treat allergies (antihistamines). A spray that eases inflammation of the nostrils (topical intranasal corticosteroids). Rinses that help get rid of thick mucus in your nose (nasal saline washes). If caused by bacteria, your health care provider may recommend waiting to see if your symptoms improve. Most bacterial infections will get better without antibiotic medicine. You may be given antibiotics if you have: A severe infection. A weak immune system. If caused by narrow nasal passages or nasal polyps, you may need to have surgery. Follow these instructions at home: Medicines Take, use, or apply over-the-counter and prescription medicines only as told by your health care provider. These may include nasal sprays. If you were prescribed an antibiotic medicine, take it as told by your health care provider.  Do not stop taking the antibiotic even if you start to feel better. Hydrate and humidify  Drink enough fluid to keep your urine pale yellow. Staying hydrated will help to thin your mucus. Use a cool mist humidifier to keep the humidity level in your home above 50%. Inhale steam for 10-15 minutes, 3-4 times a day, or as told by your health care provider. You can do this in the bathroom while a hot shower is running. Limit your exposure to cool or dry air. Rest Rest as much as possible. Sleep with your head raised (elevated). Make sure you get enough sleep each night. General instructions  Apply a warm, moist washcloth to your face 3-4 times a day or as told by your health care provider. This will help with discomfort. Wash your hands often with soap and water to reduce your exposure to germs. If soap and water are not available, use hand sanitizer. Do not smoke. Avoid being around people who are smoking (secondhand  smoke). Keep all follow-up visits as told by your health care provider. This is important. Contact a health care provider if: You have a fever. Your symptoms get worse. Your symptoms do not improve within 10 days. Get help right away if: You have a severe headache. You have persistent vomiting. You have severe pain or swelling around your face or eyes. You have vision problems. You develop confusion. Your neck is stiff. You have trouble breathing. Summary Sinusitis is soreness and inflammation of your sinuses. Sinuses are hollow spaces in the bones around your face. This condition is caused by nasal tissues that become inflamed or swollen. The swelling traps or blocks the flow of mucus. This allows bacteria, viruses, and fungi to grow, which leads to infection. If you were prescribed an antibiotic medicine, take it as told by your health care provider. Do not stop taking the antibiotic even if you start to feel better. Keep all follow-up visits as told by your health care provider. This is important. This information is not intended to replace advice given to you by your health care provider. Make sure you discuss any questions you have with your health care provider. Document Revised: 01/04/2018 Document Reviewed: 01/04/2018 Elsevier Patient Education  2022 [employer].    If you have been instructed to have an in-person evaluation today at a local Urgent Care facility, please use the link below. It will take you to a list of all of our available [facility], including address, phone number and hours of operation. Please do not delay care.  [facility]  If you or a family member do not have a primary care provider, use the link below to schedule a visit and establish care. When you choose a [facility] primary care physician or advanced practice provider, you gain a long-term partner in health. Find a Primary Care Provider  Learn more about [facility]  in-office and virtual care options: [facility]

## 2021-10-22 ENCOUNTER — Telehealth: Payer: Self-pay

## 2021-10-22 NOTE — Telephone Encounter (Signed)
Lmom to check inr  

## 2021-10-23 ENCOUNTER — Encounter: Payer: Self-pay | Admitting: Internal Medicine

## 2021-10-23 ENCOUNTER — Ambulatory Visit (INDEPENDENT_AMBULATORY_CARE_PROVIDER_SITE_OTHER): Payer: Managed Care, Other (non HMO) | Admitting: Cardiovascular Disease

## 2021-10-23 DIAGNOSIS — Z5181 Encounter for therapeutic drug level monitoring: Secondary | ICD-10-CM | POA: Diagnosis not present

## 2021-10-23 DIAGNOSIS — Z954 Presence of other heart-valve replacement: Secondary | ICD-10-CM | POA: Diagnosis not present

## 2021-10-23 LAB — PROTIME-INR: INR: 2.5 — AB (ref 0.80–1.20)

## 2021-10-31 ENCOUNTER — Other Ambulatory Visit: Payer: Self-pay

## 2021-10-31 ENCOUNTER — Ambulatory Visit (HOSPITAL_COMMUNITY): Payer: Managed Care, Other (non HMO) | Attending: Cardiology

## 2021-10-31 DIAGNOSIS — Z954 Presence of other heart-valve replacement: Secondary | ICD-10-CM | POA: Diagnosis not present

## 2021-10-31 LAB — ECHOCARDIOGRAM COMPLETE
AV Mean grad: 9.5 mmHg
AV Peak grad: 17.4 mmHg
Ao pk vel: 2.09 m/s
Area-P 1/2: 3.53 cm2
S' Lateral: 2.7 cm

## 2021-11-13 ENCOUNTER — Other Ambulatory Visit: Payer: Self-pay

## 2021-11-13 ENCOUNTER — Ambulatory Visit: Payer: Managed Care, Other (non HMO) | Admitting: Orthopedic Surgery

## 2021-11-13 DIAGNOSIS — M76822 Posterior tibial tendinitis, left leg: Secondary | ICD-10-CM | POA: Diagnosis not present

## 2021-11-14 ENCOUNTER — Encounter: Payer: Self-pay | Admitting: Orthopedic Surgery

## 2021-11-14 NOTE — Progress Notes (Signed)
? ?Office Visit Note ?  ?Patient: Frank [name]           ?Date of Birth: [birth year]           ?MRN: [id number] ?Visit Date: 11/13/2021 ?Requested by: [name], MD ?[address] ?[address] ?[place],  [place] [address] ?PCP: [name], MD ? ?Subjective: ?Chief Complaint  ?Patient presents with  ? Left Leg - Pain  ? ? ?HPI: Frank [name] is a 56 year old patient with left lower leg pain of 4 weeks duration.  Denies any history of injury.  Localizes the pain along the posterior tibial crest just above the medial malleolus.  Set.  Went to urgent care 2 weeks ago and advised it was soft tissue inflammation.  Tried rest and a Dosepak with slight improvement while taking it.  Also reports tenderness to touch in that region.  The pain will wake him from sleep occasionally.  Denies any fevers or chills.  No prior left lower extremity surgery.  Frank [name] does enjoy walking but he really could not walk for a period of time when this was flared up.  Likes to walk about 5 miles a day.  Difficult to do that until recently.  Prednisone did help his symptoms considerably but when he stopped taking the prednisone the symptoms recurred.  On the morning of his clinic visit he was able to walk a slow 2 miles. ?             ?ROS: All systems reviewed are negative as they relate to the chief complaint within the history of present illness.  Patient denies  fevers or chills. ? ? ?Assessment & Plan: ?Visit Diagnoses:  ?1. Posterior tibial tendon dysfunction (PTTD) of left lower extremity   ? ? ?Plan: Impression is left lower leg pain which looks like posterior tibial tendon deficiency and stretching.  Could have an injury at the muscle tendon junction of that posterior tib tendon.  Fluid is around the sheath asymmetrically left distal tibia versus right.  Less likely is focal stress reaction of the tibia.  Plan at this time is to make sure he has his midfoot well supported with shoes that have good arches.  If his symptoms persist and  he is not able to walk for an MRI scanning to more fully evaluate the structural integrity of the posterior tib tendon will be performed.  He will call to set that up. ? ?Follow-Up Instructions: Return if symptoms worsen or fail to improve.  ? ?Orders:  ?No orders of the defined types were placed in this encounter. ? ?No orders of the defined types were placed in this encounter. ? ? ? ? Procedures: ?No procedures performed ? ? ?Clinical Data: ?No additional findings. ? ?Objective: ?Vital Signs: There were no vitals taken for this visit. ? ?Physical Exam:  ? ?Constitutional: Patient appears well-developed ?HEENT:  ?Head: Normocephalic ?Eyes:EOM are normal ?Neck: Normal range of motion ?Cardiovascular: Normal rate ?Pulmonary/chest: Effort normal ?Neurologic: Patient is alert ?Skin: Skin is warm ?Psychiatric: Patient has normal mood and affect ? ? ?Ortho Exam: Ortho exam demonstrates normal gait and alignment.  His left forefoot does collapse into pronation with standing.  He does have normal heel inversion on the right when he stands on his toes and some heel inversion on the left.  His posterior tib tendon is tender to palpation behind the distal tibia but not really behind the medial malleolus or distally.  Patient does have good inversion eversion dorsiflexion plantarflexion strength  bilaterally.  Pedal pulses palpable. ? ?Specialty Comments:  ?No specialty comments available. ? ?Imaging: ?No results found. ? ? ?PMFS History: ?Patient Active Problem List  ? Diagnosis Date Noted  ? Gastrocnemius muscle tear, left, subsequent encounter 06/01/2020  ? Fatigue 06/01/2020  ? COVID-19 vaccine series completed 06/01/2020  ? Heart murmur 05/15/2020  ? Gynecomastia, male 05/25/2019  ? Acquired hypothyroidism 11/12/2017  ? Long term (current) use of anticoagulants 08/29/2017  ? Rectal bleeding 06/11/2017  ? Erectile dysfunction 05/16/2017  ? CAD in native artery 07/02/2015  ? Dyslipidemia 07/02/2015  ? Overweight (BMI  25.0-29.9) 05/06/2015  ? Essential hypertension 05/04/2015  ? S/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 76/73/4193  ? Encounter for therapeutic drug monitoring 12/04/2014  ? S/P ascending aortic replacement 11/27/2014  ? S/P ACL repair 05/04/2014  ? SVT (supraventricular tachycardia) ([facility]) 05/01/2014  ? Palpitations 05/01/2014  ? Routine physical examination 04/22/2013  ? Hx of bicuspid aortic valve 04/20/2013  ? Screening for prostate cancer 04/17/2012  ? Insomnia 04/15/2012  ? Esophageal reflux 04/09/2011  ? Hx of inguinal hernia surgery 04/09/2011  ? Other malaise and fatigue 04/09/2011  ? Other and unspecified hyperlipidemia 04/09/2011  ? ?Past Medical History:  ?Diagnosis Date  ? Congenital heart valve abnormality   ? bicuspid aortic valve  ? Dysrhythmia   ? PVC's  ? GERD (gastroesophageal reflux disease)   ? Hypercholesterolemia   ? PONV (postoperative nausea and vomiting)   ? after a tonsillectomy..none since  ?  ?Family History  ?Problem Relation Age of Onset  ? Arthritis Mother   ?     psoriatis, Crohn's   ? Heart disease Father   ?     CAD in his early 71s  ?  ?Past Surgical History:  ?Procedure Laterality Date  ? ANTERIOR CRUCIATE LIGAMENT REPAIR Right 08/01/2014  ? Procedure: RECONSTRUCTION ANTERIOR CRUCIATE LIGAMENT (ACL) WITH HAMSTRING GRAFT, MENISCAL DEBRIDEMENT AS NEEDED.;  Surgeon: Meredith Pel, MD;  Location: [facility];  Service: Orthopedics;  Laterality: Right;  ? BENTALL PROCEDURE N/A 11/27/2014  ? Procedure: BENTALL PROCEDURE;  Surgeon: [name], MD;  Location: [facility];  Service: Open Heart Surgery;  Laterality: N/A;  WITH CIRC ARREST  ? CARDIAC CATHETERIZATION    ? 10/26/2014  ? COLONOSCOPY  2010  ? CORONARY ANGIOGRAM  10/26/2014  ? Procedure: CORONARY ANGIOGRAM;  Surgeon: [name], MD;  Location: [facility] C[facility] LAB;  Service: Cardiovascular;;  ? INGUINAL HERNIA REPAIR  2006  ? left, Dr. Bary Castilla  ? TEE WITHOUT CARDIOVERSION N/A 08/01/2014  ? Procedure: TRANSESOPHAGEAL ECHOCARDIOGRAM  (TEE);  Surgeon: Meredith Pel, MD;  Location: [facility];  Service: Orthopedics;  Laterality: N/A;  ? TEE WITHOUT CARDIOVERSION N/A 11/27/2014  ? Procedure: TRANSESOPHAGEAL ECHOCARDIOGRAM (TEE);  Surgeon: [name], MD;  Location: [facility];  Service: Open Heart Surgery;  Laterality: N/A;  ? TONSILLECTOMY  2001  ? ?Social History  ? ?Occupational History  ? Occupation: Editor, commissioning  ?  Employer: [employer]  ?  Comment: full time  ? Occupation: Social research officer, government  ?Tobacco Use  ? Smoking status: Never  ? Smokeless tobacco: Never  ?Substance and Sexual Activity  ? Alcohol use: Yes  ?  Alcohol/week: 7.0 standard drinks  ?  Types: 7 Glasses of wine per week  ?  Comment: occasional, drink 1 beer per wk  ? Drug use: No  ? Sexual activity: Yes  ? ? ? ? ? ?

## 2021-11-29 ENCOUNTER — Telehealth: Payer: Managed Care, Other (non HMO) | Admitting: Internal Medicine

## 2021-12-06 ENCOUNTER — Telehealth: Payer: Self-pay

## 2021-12-06 LAB — POCT INR: INR: 2.2 (ref 2.0–3.0)

## 2021-12-06 NOTE — Telephone Encounter (Signed)
Lpm to have INR lab drawn ?

## 2021-12-09 ENCOUNTER — Encounter: Payer: Self-pay | Admitting: Internal Medicine

## 2021-12-09 ENCOUNTER — Telehealth: Payer: Managed Care, Other (non HMO) | Admitting: Internal Medicine

## 2021-12-09 ENCOUNTER — Telehealth: Payer: Self-pay

## 2021-12-09 VITALS — BP 111/71 | Ht 68.5 in | Wt 174.0 lb

## 2021-12-09 DIAGNOSIS — M76822 Posterior tibial tendinitis, left leg: Secondary | ICD-10-CM | POA: Diagnosis not present

## 2021-12-09 DIAGNOSIS — I2510 Atherosclerotic heart disease of native coronary artery without angina pectoris: Secondary | ICD-10-CM

## 2021-12-09 DIAGNOSIS — M6281 Muscle weakness (generalized): Secondary | ICD-10-CM

## 2021-12-09 DIAGNOSIS — I10 Essential (primary) hypertension: Secondary | ICD-10-CM | POA: Diagnosis not present

## 2021-12-09 NOTE — Assessment & Plan Note (Signed)
In the setting of reduced activity,  Recurrent steroid tapers and statin therapy. Improving with exercise,  Checking CK and LFTs ?

## 2021-12-09 NOTE — Assessment & Plan Note (Signed)
BP improved with weight loss   Stopping hct ?

## 2021-12-09 NOTE — Progress Notes (Signed)
Virtual Visit via CAregility Note ? ?This visit type was conducted due to national recommendations for restrictions regarding the COVID-19 pandemic (e.g. social distancing).  This format is felt to be most appropriate for this patient at this time.  All issues noted in this document were discussed and addressed.  No physical exam was performed (except for noted visual exam findings with Video Visits).  ? ?I connected withNAME@ on 12/09/21 at  4:30 PM EDT by a video enabled telemedicine application  and verified that I am speaking with the correct person using two identifiers. ?Location patient: home ?Location provider: work or home office ?Persons participating in the virtual visit: patient, provider ? ?I discussed the limitations, risks, security and privacy concerns of performing an evaluation and management service by telephone and the availability of in person appointments. I also discussed with the patient that there may be a patient responsible charge related to this service. The patient expressed understanding and agreed to proceed. ? ?Reason for visit: 6 month follow up  ? ?HPI: ? ?1)  HTN:  stopped hctz in march.  No change in BP when suspended.  Readings 1110/70 ? ?2) proximal muscle weakness in the setting of recent high ankle sprain and 2 rounds of prednisone ? ?3) CAD by CT angiogram preoperatively for aortic valve.  Discussed risk stratification with coronary calcium  CT  ? ?ROS: See pertinent positives and negatives per HPI. ? ?Past Medical History:  ?Diagnosis Date  ? Congenital heart valve abnormality   ? bicuspid aortic valve  ? Dysrhythmia   ? PVC's  ? GERD (gastroesophageal reflux disease)   ? Hypercholesterolemia   ? PONV (postoperative nausea and vomiting)   ? after a tonsillectomy..none since  ? ? ?Past Surgical History:  ?Procedure Laterality Date  ? ANTERIOR CRUCIATE LIGAMENT REPAIR Right 08/01/2014  ? Procedure: RECONSTRUCTION ANTERIOR CRUCIATE LIGAMENT (ACL) WITH HAMSTRING GRAFT, MENISCAL  DEBRIDEMENT AS NEEDED.;  Surgeon: Meredith Pel, MD;  Location: [facility];  Service: Orthopedics;  Laterality: Right;  ? BENTALL PROCEDURE N/A 11/27/2014  ? Procedure: BENTALL PROCEDURE;  Surgeon: [name], MD;  Location: [facility];  Service: Open Heart Surgery;  Laterality: N/A;  WITH CIRC ARREST  ? CARDIAC CATHETERIZATION    ? 10/26/2014  ? COLONOSCOPY  2010  ? CORONARY ANGIOGRAM  10/26/2014  ? Procedure: CORONARY ANGIOGRAM;  Surgeon: [name], MD;  Location: [facility] CATH LAB;  Service: Cardiovascular;;  ? INGUINAL HERNIA REPAIR  2006  ? left, Dr. Bary Castilla  ? TEE WITHOUT CARDIOVERSION N/A 08/01/2014  ? Procedure: TRANSESOPHAGEAL ECHOCARDIOGRAM (TEE);  Surgeon: Meredith Pel, MD;  Location: [facility];  Service: Orthopedics;  Laterality: N/A;  ? TEE WITHOUT CARDIOVERSION N/A 11/27/2014  ? Procedure: TRANSESOPHAGEAL ECHOCARDIOGRAM (TEE);  Surgeon: [name], MD;  Location: [facility];  Service: Open Heart Surgery;  Laterality: N/A;  ? TONSILLECTOMY  2001  ? ? ?Family History  ?Problem Relation Age of Onset  ? Arthritis Mother   ?     psoriatis, Crohn's   ? Heart disease Father   ?     CAD in his early 69s  ? ? ?SOCIAL HX:  reports that he has never smoked. He has never used smokeless tobacco. He reports current alcohol use of about 7.0 standard drinks per week. He reports that he does not use drugs.  ? ? ?Current Outpatient Medications:  ?  ALPRAZolam (XANAX) 0.5 MG tablet, TAKE 1 TABLET BY MOUTH EVERY DAY AT BEDTIME AS NEEDED, Disp: 30  tablet, Rfl: 5 ?  Ascorbic Acid (VITAMIN C) 1000 MG tablet, Take 1,000 mg by mouth daily., Disp: , Rfl:  ?  fluticasone (FLONASE) 50 MCG/ACT nasal spray, Place 2 sprays into both nostrils daily., Disp: 16 g, Rfl: 0 ?  hydrochlorothiazide (HYDRODIURIL) 12.5 MG tablet, TAKE 1 TABLET BY MOUTH  DAILY, Disp: 90 tablet, Rfl: 3 ?  levothyroxine (SYNTHROID) 50 MCG tablet, TAKE 1 TABLET BY MOUTH  DAILY BEFORE BREAKFAST, Disp: 90 tablet, Rfl: 3 ?  losartan (COZAAR) 50 MG tablet, TAKE 1 TABLET  BY MOUTH  DAILY, Disp: 90 tablet, Rfl: 3 ?  metoprolol tartrate (LOPRESSOR) 25 MG tablet, TAKE 1 TABLET BY MOUTH  TWICE DAILY, Disp: 180 tablet, Rfl: 3 ?  rosuvastatin (CRESTOR) 20 MG tablet, TAKE 2 TABLETS BY MOUTH  DAILY, Disp: 180 tablet, Rfl: 3 ?  warfarin (COUMADIN) 5 MG tablet, TAKE ONE-HALF TO 1 TABLET  BY MOUTH DAILY AS DIRECTED  BY COUMADIN CLINIC, Disp: 90 tablet, Rfl: 3 ? ?EXAM: ? ?VITALS per patient if applicable: ? ?GENERAL: alert, oriented, appears well and in no acute distress ? ?HEENT: atraumatic, conjunttiva clear, no obvious abnormalities on inspection of external nose and ears ? ?NECK: normal movements of the head and neck ? ?LUNGS: on inspection no signs of respiratory distress, breathing rate appears normal, no obvious gross SOB, gasping or wheezing ? ?CV: no obvious cyanosis ? ?MS: moves all visible extremities without noticeable abnormality ? ?PSYCH/NEURO: pleasant and cooperative, no obvious depression or anxiety, speech and thought processing grossly intact ? ?ASSESSMENT AND PLAN: ? ?Discussed the following assessment and plan: ? ?Proximal muscle weakness - Plan: Comprehensive metabolic panel, CK ? ?CAD in native artery ? ?Essential hypertension ? ?Posterior tibial tendon dysfunction, left ? ?CAD in native artery ?Recommend coronary calcium CT score for further evaluation through his cardiologist ? ?Essential hypertension ?BP improved with weight loss   Stopping hct ? ?Proximal muscle weakness ?In the setting of reduced activity,  Recurrent steroid tapers and statin therapy. Improving with exercise,  Checking CK and LFTs ? ?  ?I discussed the assessment and treatment plan with the patient. The patient was provided an opportunity to ask questions and all were answered. The patient agreed with the plan and demonstrated an understanding of the instructions. ?  ?The patient was advised to call back or seek an in-person evaluation if the symptoms worsen or if the condition fails to improve as  anticipated. ? ? ?I spent 30 minutes dedicated to the care of this patient on the date of this encounter to include pre-visit review of his medical history,  Face-to-face time with the patient , and post visit ordering of testing and therapeutics.  ? ? ?[name], MD   ?

## 2021-12-09 NOTE — Assessment & Plan Note (Addendum)
Recommend coronary calcium CT score for further evaluation through his cardiologist ?

## 2021-12-09 NOTE — Telephone Encounter (Signed)
Called [facility].  They are faxing results. 4/24 ?

## 2021-12-09 NOTE — Patient Instructions (Signed)
Ask Dr Debara Pickett about getting a coronary calcium CT Scan  ? ? ?[facility] :  let me know who you would like to see over there.  ? ?Ok to stop the hctz  ? ? ?

## 2021-12-10 ENCOUNTER — Ambulatory Visit (INDEPENDENT_AMBULATORY_CARE_PROVIDER_SITE_OTHER): Payer: Managed Care, Other (non HMO) | Admitting: Pharmacist

## 2021-12-10 DIAGNOSIS — Z954 Presence of other heart-valve replacement: Secondary | ICD-10-CM

## 2021-12-10 DIAGNOSIS — Z7901 Long term (current) use of anticoagulants: Secondary | ICD-10-CM

## 2021-12-10 NOTE — Patient Instructions (Signed)
Description   ?continue with 1/2 tablet daily except 1 tablet each Mon and Fri. Recheck INR in 6 weeks.   ?  ? ? ?

## 2021-12-11 LAB — BASIC METABOLIC PANEL: Creatinine: 1.3 (ref 0.6–1.3)

## 2021-12-11 LAB — COMPREHENSIVE METABOLIC PANEL: eGFR: 67

## 2021-12-12 LAB — COMPREHENSIVE METABOLIC PANEL
ALT: 22 IU/L (ref 0–44)
AST: 31 IU/L (ref 0–40)
Albumin/Globulin Ratio: 2 (ref 1.2–2.2)
Albumin: 4.5 g/dL (ref 3.8–4.9)
Alkaline Phosphatase: 57 IU/L (ref 44–121)
BUN/Creatinine Ratio: 12 (ref 9–20)
BUN: 15 mg/dL (ref 6–24)
Bilirubin Total: 0.4 mg/dL (ref 0.0–1.2)
CO2: 23 mmol/L (ref 20–29)
Calcium: 9.7 mg/dL (ref 8.7–10.2)
Chloride: 105 mmol/L (ref 96–106)
Creatinine, Ser: 1.24 mg/dL (ref 0.76–1.27)
Globulin, Total: 2.2 g/dL (ref 1.5–4.5)
Glucose: 89 mg/dL (ref 70–99)
Potassium: 4.5 mmol/L (ref 3.5–5.2)
Sodium: 143 mmol/L (ref 134–144)
Total Protein: 6.7 g/dL (ref 6.0–8.5)
eGFR: 68 mL/min/{1.73_m2} (ref >59)

## 2021-12-17 ENCOUNTER — Encounter: Payer: Self-pay | Admitting: Internal Medicine

## 2021-12-19 ENCOUNTER — Other Ambulatory Visit: Payer: Self-pay | Admitting: Internal Medicine

## 2021-12-19 DIAGNOSIS — M6281 Muscle weakness (generalized): Secondary | ICD-10-CM

## 2021-12-21 LAB — CK: Total CK: 224 U/L (ref 41–331)

## 2021-12-31 ENCOUNTER — Encounter: Payer: Self-pay | Admitting: Internal Medicine

## 2021-12-31 ENCOUNTER — Ambulatory Visit: Payer: Managed Care, Other (non HMO) | Admitting: Internal Medicine

## 2021-12-31 VITALS — BP 132/88 | HR 58 | Ht 70.0 in | Wt 180.6 lb

## 2021-12-31 DIAGNOSIS — Z954 Presence of other heart-valve replacement: Secondary | ICD-10-CM

## 2021-12-31 DIAGNOSIS — E785 Hyperlipidemia, unspecified: Secondary | ICD-10-CM

## 2021-12-31 DIAGNOSIS — Z7901 Long term (current) use of anticoagulants: Secondary | ICD-10-CM | POA: Diagnosis not present

## 2021-12-31 DIAGNOSIS — Z9889 Other specified postprocedural states: Secondary | ICD-10-CM

## 2021-12-31 DIAGNOSIS — I2510 Atherosclerotic heart disease of native coronary artery without angina pectoris: Secondary | ICD-10-CM

## 2021-12-31 NOTE — Progress Notes (Signed)
? ? ?OFFICE NOTE ? ?Chief Complaint:  ?No complaints ? ?Primary Care Physician: ?[name], MD ? ?HPI:  ?Frank [name] is a 56 y.o. male with a history of at least moderate AS (followed by Dr. [name] at the [facility]), presented for ACL repair with Dr. [name]. Preoperatively, he was evaluated by Dr. [name] (cardiac anesthesia) and found to have a loud AS murmur- After some discussion, interoperative TEE was recommended and performed. I personally reviewed the TEE images which do demonstrate severe aortic stenosis, moderate AI and mild to moderate MR (obtained during surgery, on pressor, PPV, etc, but still significant). He was recently told he probably had another 5 years before needing to consider surgery. Mean gradient is already above 40 mmHg.  Clinically, he has been asymptomatic (no chest pain, dyspnea, syncope, etc,) but recently he has had more palpitations (PVC's) and was started on b-blocker.  He did manage to get through surgery uneventfully.  ?  ?He returns today in follow-up to the office. He continues to deny any chest pain, worsening shortness of breath, presyncope or syncopal episodes. He has not pushed himself significantly due to his recent knee surgery. He is undergoing rehabilitation and getting stronger. A repeat echocardiogram was just performed as an outpatient. This was initially read by one of my partners and indicated mild to moderate aortic stenosis based on a mean valve gradient in the 20s. However subsequent images during the study do indicate mean gradients greater than 50 from the right upper sternal border using Pedoff probe. I therefore personally reviewed the echocardiogram images and made the appropriate corrections,  and the results are as follows: ?  ?Study Conclusions ? ?- Left ventricle: The cavity size was normal. Wall thickness was ?  increased in a pattern of mild LVH. Systolic function was normal. ?  The estimated ejection fraction was in the range of 55% to  60%. ?- Aortic valve: The aortic valve is bicuspid and is heavily ?  calcified with restricted leaflet motion. Peak and mean gradients ?  through the valve are 100 and 59 mm Hg respectively, consistent ?  with severe AS. The calculated AVA is 0.6 cm2. There is moderate, ?  eccentric AI. There was mild regurgitation. ?- Aorta: Aortic root dimension: 39 mm (ED). ?- Aortic root: The aortic root is mildly dilated. ?- Mitral valve: Calcified annulus. Mildly thickened leaflets . ?  There was mild regurgitation. ?- Left atrium: LA Volume/BSA= 46.4 ml/m2. The atrium is moderately ?  dilated. ? ?Impressions: ? ?- LVEF 60-65%, moderate concentric LVH, bicuspid and severely ?  stenotic aortic valve- AVA 0.6 cm2, peak and mean gradients of ?  100 mmHg and 59 mmHg, respectively - LVOT diameter measured at ?  2.4 cm. There is moderate AI. There is mild to moderate MR with ?  restricted anterior leaflet motion secondary to eccentric AI.  ? ?Pedoff measurements do indeed show a very high mean gradient of 59 and peak gradient of 100 mmHg. Based on a generous LVOT diameter of 2.4 cm, the calculated aortic valve gradient was 0.6 cm? consistent with severe aortic stenosis. ?  ?I saw Frank [name] back in the office today. He was recently on a trip and noticed increasing shortness of breath and easy fatigue with exercise which was a new finding for him. He feels that this is symptomatic aortic stenosis and I would agree. I discussed the case further with Dr. Cyndia Bent, the cardiac thoracic surgeon that he's been seeing, and we  feel that it's probably the time now to entertain valve replacement. Based on that there is also concern about aortopathy given his bicuspid valve. He underwent a CT scan of the aorta and coronaries which showed does reveal multivessel coronary artery calcification as well as calcium deposits in the left main coronary. The aortic valve is heavily calcified and restricted in motion. There was at least mild cardiac  hypertrophy and dilatation of the left ventricle. The ascending aorta is dilated measuring 4.3-4.4 cm. There is no evidence for coarctation or enlargement of the thoracic or descending aorta. ?  ?Frank [name] returns today for follow-up. He successfully underwent Bentall procedure with hemi-arch replacement using a 28 mm Hemashield graft under deep hypothermic circulatory arrest and Bentall Procedure ( replacement of aortic valve and aortic root with reimplantation of the coronary arteries) using a 21 mm St. Jude mechanical valve graft.  Overall he is doing exceedingly well. He continues to do exercise. He is also recovering from recent knee surgery. He denies any chest pain or worsening shortness of breath. His energy levels improved significantly. His median sternotomy is healing well. He did have some postoperative anemia however this seems to have improved and his energy level is better. ?  ?I saw [name] back in the office today for follow-up. He is doing exceedingly well. He's been active and working and traveling without any restrictions. He denies any chest pain or shortness of breath. He's had a small amount of weight gain however BMI is only 26. He feels better than he has in years he says. Recently we increased his cholesterol medication as his LDL was still not treated to goal. Particle number was just over 1100. He is due for recheck of this next month. He says he does get some mild muscle soreness after exercise that does not resolve quickly which he may attribute to increase in Crestor. ?  ?01/16/2016 ?  ?Frank [name] returns today for follow-up. He reports that he is doing exceedingly well. His last use been excellent for him. He is exercising more regularly. He's had a small amount of weight gain but is less than 5 pounds and is starting to come down. He denies any chest pain or shortness of breath. His blood pressure is slightly higher recently however and he was started on losartan 50 mg daily.  Initial blood pressure today was 130/90 however recheck was 110/80 in the left arm and 112/82 in the right arm. He was concerned about some differential blood pressures in the past however this difference is negligible. In discussion today we realized he had not had an echocardiogram since his valve surgery and we need to establish a baseline valve gradient. ? ?08/22/2016 ? ?Frank [name] returns today for follow-up. He says is feeling exceedingly well. He exercises regularly now and has a better energy level. He denies any shortness of breath or chest pain. He's been compliant with his INR checks and is Coumadin levels have been appropriate. Blood pressure appears well controlled. He has excellent cholesterol control with LDL in the 80s on rosuvastatin.  ? ?08/28/2017 ? ?Frank [name] is doing well and is without complaints. He is pleased with his AVR. He exercises regularly and has gained some muscle mass. He has maintained a therapeutic INR. He had an echo this past Summer which demonstrated a stable aortic valve gradient and normal LV function. BP is well controlled. Recent labs show LDL of 81 on Crestor. ? ?09/02/2018 ? ?Frank [name] is seen today in follow-up.  He is doing extremely well.  He says he is not felt this great in years.  He was struggling with some palpitations however is added some supplementary potassium to his diet which is helped his symptoms.  He has been therapeutic on warfarin which is been stable.  Weight is stable.  Blood pressure is now well controlled.  He denies any chest pain or shortness of breath.  He exercises several times a week. ? ?09/21/2019 ? ?Frank [name] returns today for annual follow-up.  He recently underwent an echo which showed normal systolic function, preserved mechanical aortic valve gradient and stable ascending aortic diameters.  Symptomatically he feels well.  He has no new complaints is worsening shortness of breath or chest pain.  He had some recent labs but is not had  reassessment of his lipids or thyroid.  He is on a low-dose of thyroid replacement.  He has been concerned about some recent development of gynecomastia.  He underwent endocrine testing for this which was unrem

## 2021-12-31 NOTE — Patient Instructions (Signed)
Medication Instructions:  NO CHANGES  *If you need a refill on your cardiac medications before your next appointment, please call your pharmacy*  Follow-Up: At [facility], you and your health needs are our priority.  As part of our continuing mission to provide you with exceptional heart care, we have created designated Provider Care Teams.  These Care Teams include your primary Cardiologist (physician) and Advanced Practice Providers (APPs -  Physician Assistants and Nurse Practitioners) who all work together to provide you with the care you need, when you need it.  We recommend signing up for the patient portal called "MyChart".  Sign up information is provided on this After Visit Summary.  MyChart is used to connect with patients for Virtual Visits (Telemedicine).  Patients are able to view lab/test results, encounter notes, upcoming appointments, etc.  Non-urgent messages can be sent to your provider as well.   To learn more about what you can do with MyChart, go to [email or web address].    Your next appointment:   12 month(s)  The format for your next appointment:   In Person  Provider:   Kenneth C Hilty, MD { 

## 2022-01-05 ENCOUNTER — Other Ambulatory Visit: Payer: Self-pay | Admitting: Internal Medicine

## 2022-01-06 NOTE — Telephone Encounter (Signed)
Refilled: 07/01/2021 Last OV: 12/09/2021 Next OV: 06/02/2022

## 2022-01-17 LAB — PROTIME-INR: INR: 3.9 — AB (ref 0.80–1.20)

## 2022-01-23 ENCOUNTER — Ambulatory Visit (INDEPENDENT_AMBULATORY_CARE_PROVIDER_SITE_OTHER): Payer: Managed Care, Other (non HMO) | Admitting: Cardiovascular Disease

## 2022-01-23 DIAGNOSIS — Z954 Presence of other heart-valve replacement: Secondary | ICD-10-CM

## 2022-01-23 DIAGNOSIS — Z5181 Encounter for therapeutic drug level monitoring: Secondary | ICD-10-CM | POA: Diagnosis not present

## 2022-02-12 LAB — PROTIME-INR: INR: 2.3 — AB (ref <=1.20)

## 2022-02-13 ENCOUNTER — Ambulatory Visit (INDEPENDENT_AMBULATORY_CARE_PROVIDER_SITE_OTHER): Payer: Managed Care, Other (non HMO)

## 2022-02-13 DIAGNOSIS — Z954 Presence of other heart-valve replacement: Secondary | ICD-10-CM | POA: Diagnosis not present

## 2022-02-13 DIAGNOSIS — Z5181 Encounter for therapeutic drug level monitoring: Secondary | ICD-10-CM

## 2022-02-13 NOTE — Patient Instructions (Signed)
Description   Called and spoke with pt. Instructed to continue taking 1/2 tablet daily except 1 tablet each Mondays and Fridays Recheck INR in 6 weeks.   [facility] # [phone]

## 2022-03-15 ENCOUNTER — Other Ambulatory Visit: Payer: Self-pay | Admitting: Internal Medicine

## 2022-03-26 ENCOUNTER — Telehealth: Payer: Self-pay

## 2022-03-26 DIAGNOSIS — Z5181 Encounter for therapeutic drug level monitoring: Secondary | ICD-10-CM

## 2022-03-26 NOTE — Telephone Encounter (Signed)
Pt due to have PT/INR drawn today. Called and spoke with pt. Frank [name] stated he went to lab today; however, he needs a new lab order placed for them to be able to draw the PT/INR. Orders placed and pt stated he would go tomorrow or Friday to have labs drawn.

## 2022-03-27 ENCOUNTER — Telehealth: Payer: Self-pay | Admitting: *Deleted

## 2022-03-27 NOTE — Telephone Encounter (Signed)
Called pt since lab results have not been received; pt states he will go tomorrow to a [employer] as he is out of town.

## 2022-03-29 LAB — PROTIME-INR
INR: 2.4 — ABNORMAL HIGH (ref 0.9–1.2)
Prothrombin Time: 24.2 s — ABNORMAL HIGH (ref 9.1–12.0)

## 2022-03-31 ENCOUNTER — Ambulatory Visit (INDEPENDENT_AMBULATORY_CARE_PROVIDER_SITE_OTHER): Payer: Managed Care, Other (non HMO) | Admitting: Cardiovascular Disease

## 2022-03-31 DIAGNOSIS — Z5181 Encounter for therapeutic drug level monitoring: Secondary | ICD-10-CM

## 2022-03-31 DIAGNOSIS — Z954 Presence of other heart-valve replacement: Secondary | ICD-10-CM | POA: Diagnosis not present

## 2022-04-11 NOTE — Patient Instructions (Signed)
Description   Called and spoke with pt. Instructed to continue taking 1/2 tablet daily except 1 tablet each Mondays and Fridays Recheck INR in 6 weeks.   Coumadin Clinic # [phone]

## 2022-04-20 ENCOUNTER — Telehealth: Payer: Managed Care, Other (non HMO) | Admitting: Nurse Practitioner

## 2022-04-20 DIAGNOSIS — U071 COVID-19: Secondary | ICD-10-CM | POA: Diagnosis not present

## 2022-04-20 MED ORDER — MOLNUPIRAVIR EUA 200MG CAPSULE: 4.0000 | ORAL_CAPSULE | Freq: Two times a day (BID) | ORAL | 0 refills | Status: AC

## 2022-04-20 MED ORDER — PROMETHAZINE-DM 6.25-15 MG/5ML PO SYRP: 5.0000 mL | ORAL_SOLUTION | Freq: Four times a day (QID) | ORAL | 0 refills | Status: DC | PRN

## 2022-04-20 NOTE — Patient Instructions (Signed)
Frank Li Sinnett, thank you for joining Gildardo Pounds, NP for today's virtual visit.  While this provider is not your primary care provider (PCP), if your PCP is located in our provider database this encounter information will be shared with them immediately following your visit.  Consent: (Patient) Frank [name] provided verbal consent for this virtual visit at the beginning of the encounter.  Current Medications:  Current Outpatient Medications:    ALPRAZolam (XANAX) 0.5 MG tablet, TAKE 1 TABLET BY MOUTH EVERY DAY AT BEDTIME AS NEEDED, Disp: 30 tablet, Rfl: 5   molnupiravir EUA (LAGEVRIO) 200 mg CAPS capsule, Take 4 capsules (800 mg total) by mouth 2 (two) times daily for 5 days., Disp: 40 capsule, Rfl: 0   promethazine-dextromethorphan (PROMETHAZINE-DM) 6.25-15 MG/5ML syrup, Take 5 mLs by mouth 4 (four) times daily as needed for cough., Disp: 240 mL, Rfl: 0   Ascorbic Acid (VITAMIN C) 1000 MG tablet, Take 1,000 mg by mouth daily., Disp: , Rfl:    fluticasone (FLONASE) 50 MCG/ACT nasal spray, Place 2 sprays into both nostrils daily., Disp: 16 g, Rfl: 0   hydrochlorothiazide (HYDRODIURIL) 12.5 MG tablet, TAKE 1 TABLET BY MOUTH  DAILY, Disp: 90 tablet, Rfl: 3   levothyroxine (SYNTHROID) 50 MCG tablet, TAKE 1 TABLET BY MOUTH  DAILY BEFORE BREAKFAST, Disp: 90 tablet, Rfl: 3   losartan (COZAAR) 50 MG tablet, TAKE 1 TABLET BY MOUTH  DAILY, Disp: 90 tablet, Rfl: 3   metoprolol tartrate (LOPRESSOR) 25 MG tablet, TAKE 1 TABLET BY MOUTH  TWICE DAILY, Disp: 180 tablet, Rfl: 3   rosuvastatin (CRESTOR) 20 MG tablet, TAKE 2 TABLETS BY MOUTH  DAILY, Disp: 180 tablet, Rfl: 3   warfarin (COUMADIN) 5 MG tablet, TAKE ONE-HALF TO 1 TABLET  BY MOUTH DAILY AS DIRECTED  BY COUMADIN CLINIC, Disp: 90 tablet, Rfl: 3   Medications ordered in this encounter:  Meds ordered this encounter  Medications   promethazine-dextromethorphan (PROMETHAZINE-DM) 6.25-15 MG/5ML syrup    Sig: Take 5 mLs by mouth 4 (four) times  daily as needed for cough.    Dispense:  240 mL    Refill:  0    Order Specific Question:   Supervising Provider    Answer:   MILLER, BRIAN [[id number]]   molnupiravir EUA (LAGEVRIO) 200 mg CAPS capsule    Sig: Take 4 capsules (800 mg total) by mouth 2 (two) times daily for 5 days.    Dispense:  40 capsule    Refill:  0    Order Specific Question:   Supervising Provider    Answer:   Sabra Heck, [place]     *If you need refills on other medications prior to your next appointment, please contact your pharmacy*  Follow-Up: Call back or seek an in-person evaluation if the symptoms worsen or if the condition fails to improve as anticipated.  Other Instructions  Please keep well-hydrated and get plenty of rest. Start a saline nasal rinse to flush out your nasal passages. You can use plain Mucinex to help thin congestion. If you have a humidifier, you can use this daily as needed.    You are to wear a mask for 5 days from onset of your symptoms.  After day 5, if you have had no fever and you are feeling better with NO symptoms, you can end masking. Keep in mind you can be contagious 10 days from the onset of symptoms  After day 5 if you have a fever or are having significant symptoms,  please wear your mask for full 10 days.   If you note any worsening of symptoms, any significant shortness of breath or any chest pain, please seek ER evaluation ASAP.  Please do not delay care!    If you note any worsening of symptoms, any significant shortness of breath or any chest pain, please seek ER evaluation ASAP.  Please do not delay care!    If you have been instructed to have an in-person evaluation today at a local Urgent Care facility, please use the link below. It will take you to a list of all of our available [place] [facility], including address, phone number and hours of operation. Please do not delay care.  [facility]  If you or a family member do not have a primary care  provider, use the link below to schedule a visit and establish care. When you choose a [place] primary care physician or advanced practice provider, you gain a long-term partner in health. Find a Primary Care Provider  Learn more about [facility] in-office and virtual care options: [facility]

## 2022-04-20 NOTE — Progress Notes (Signed)
Virtual Visit Consent   [name], you are scheduled for a virtual visit with a [facility] provider today. Just as with appointments in the office, your consent must be obtained to participate. Your consent will be active for this visit and any virtual visit you may have with one of our providers in the next 365 days. If you have a MyChart account, a copy of this consent can be sent to you electronically.  As this is a virtual visit, video technology does not allow for your provider to perform a traditional examination. This may limit your provider's ability to fully assess your condition. If your provider identifies any concerns that need to be evaluated in person or the need to arrange testing (such as labs, EKG, etc.), we will make arrangements to do so. Although advances in technology are sophisticated, we cannot ensure that it will always work on either your end or our end. If the connection with a video visit is poor, the visit may have to be switched to a telephone visit. With either a video or telephone visit, we are not always able to ensure that we have a secure connection.  By engaging in this virtual visit, you consent to the provision of healthcare and authorize for your insurance to be billed (if applicable) for the services provided during this visit. Depending on your insurance coverage, you may receive a charge related to this service.  I need to obtain your verbal consent now. Are you willing to proceed with your visit today? Frank [name] has provided verbal consent on 04/20/2022 for a virtual visit (video or telephone). Frank [name]  Date: 04/20/2022 9:50 AM  Virtual Visit via Video Note   I, Frank [name], connected with  Frank [name]  ([id number], 09-Oct-1965) on 04/20/22 at  9:45 AM EDT by a video-enabled telemedicine application and verified that I am speaking with the correct person using two identifiers.  Location: Patient: Virtual Visit Location Patient:  Home Provider: Virtual Visit Location Provider: Home Office   I discussed the limitations of evaluation and management by telemedicine and the availability of in person appointments. The patient expressed understanding and agreed to proceed.    History of Present Illness: Frank [name] is a 56 y.o. who identifies as a male who was assigned male at birth, and is being seen today for positive home antigen COVID test.  Current symptoms include nasal congestion, body aches, productive cough. Denies fever. Last temp 98.1. Tested positive for Covid today. Symptoms onset <5 days ago.    Problems:  Patient Active Problem List   Diagnosis Date Noted   Posterior tibial tendon dysfunction, left 12/09/2021   Proximal muscle weakness 12/09/2021   Gastrocnemius muscle tear, left, subsequent encounter 06/01/2020   Fatigue 06/01/2020   COVID-19 vaccine series completed 06/01/2020   Heart murmur 05/15/2020   Gynecomastia, male 05/25/2019   Acquired hypothyroidism 11/12/2017   Long term (current) use of anticoagulants 08/29/2017   Rectal bleeding 06/11/2017   Erectile dysfunction 05/16/2017   CAD in native artery 07/02/2015   Dyslipidemia 07/02/2015   Overweight (BMI 25.0-29.9) 05/06/2015   Essential hypertension 05/04/2015   S/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 49/70/2637   Encounter for therapeutic drug monitoring 12/04/2014   S/P ascending aortic replacement 11/27/2014   S/P ACL repair 05/04/2014   SVT (supraventricular tachycardia) ([place]) 05/01/2014   Palpitations 05/01/2014   Routine physical examination 04/22/2013   Hx of bicuspid aortic valve 04/20/2013  Screening for prostate cancer 04/17/2012   Insomnia 04/15/2012   Esophageal reflux 04/09/2011   Hx of inguinal hernia surgery 04/09/2011   Other malaise and fatigue 04/09/2011   Other and unspecified hyperlipidemia 04/09/2011    Allergies: No Known Allergies Medications:  Current Outpatient Medications:     ALPRAZolam (XANAX) 0.5 MG tablet, TAKE 1 TABLET BY MOUTH EVERY DAY AT BEDTIME AS NEEDED, Disp: 30 tablet, Rfl: 5   molnupiravir EUA (LAGEVRIO) 200 mg CAPS capsule, Take 4 capsules (800 mg total) by mouth 2 (two) times daily for 5 days., Disp: 40 capsule, Rfl: 0   promethazine-dextromethorphan (PROMETHAZINE-DM) 6.25-15 MG/5ML syrup, Take 5 mLs by mouth 4 (four) times daily as needed for cough., Disp: 240 mL, Rfl: 0   Ascorbic Acid (VITAMIN C) 1000 MG tablet, Take 1,000 mg by mouth daily., Disp: , Rfl:    fluticasone (FLONASE) 50 MCG/ACT nasal spray, Place 2 sprays into both nostrils daily., Disp: 16 g, Rfl: 0   hydrochlorothiazide (HYDRODIURIL) 12.5 MG tablet, TAKE 1 TABLET BY MOUTH  DAILY, Disp: 90 tablet, Rfl: 3   levothyroxine (SYNTHROID) 50 MCG tablet, TAKE 1 TABLET BY MOUTH  DAILY BEFORE BREAKFAST, Disp: 90 tablet, Rfl: 3   losartan (COZAAR) 50 MG tablet, TAKE 1 TABLET BY MOUTH  DAILY, Disp: 90 tablet, Rfl: 3   metoprolol tartrate (LOPRESSOR) 25 MG tablet, TAKE 1 TABLET BY MOUTH  TWICE DAILY, Disp: 180 tablet, Rfl: 3   rosuvastatin (CRESTOR) 20 MG tablet, TAKE 2 TABLETS BY MOUTH  DAILY, Disp: 180 tablet, Rfl: 3   warfarin (COUMADIN) 5 MG tablet, TAKE ONE-HALF TO 1 TABLET  BY MOUTH DAILY AS DIRECTED  BY COUMADIN CLINIC, Disp: 90 tablet, Rfl: 3  Observations/Objective: Patient is well-developed, well-nourished in no acute distress.  Resting comfortably  at home.  Head is normocephalic, atraumatic.  No labored breathing.  Speech is clear and coherent with logical content.  Patient is alert and oriented at baseline.    Assessment and Plan: 1. Positive self-administered antigen test for COVID-19 - promethazine-dextromethorphan (PROMETHAZINE-DM) 6.25-15 MG/5ML syrup; Take 5 mLs by mouth 4 (four) times daily as needed for cough.  Dispense: 240 mL; Refill: 0 - molnupiravir EUA (LAGEVRIO) 200 mg CAPS capsule; Take 4 capsules (800 mg total) by mouth 2 (two) times daily for 5 days.  Dispense: 40  capsule; Refill: 0  Please keep well-hydrated and get plenty of rest. Start a saline nasal rinse to flush out your nasal passages. You can use plain Mucinex to help thin congestion. If you have a humidifier, you can use this daily as needed.    You are to wear a mask for 5 days from onset of your symptoms.  After day 5, if you have had no fever and you are feeling better with NO symptoms, you can end masking. Keep in mind you can be contagious 10 days from the onset of symptoms  After day 5 if you have a fever or are having significant symptoms, please wear your mask for full 10 days.   If you note any worsening of symptoms, any significant shortness of breath or any chest pain, please seek ER evaluation ASAP.  Please do not delay care!    If you note any worsening of symptoms, any significant shortness of breath or any chest pain, please seek ER evaluation ASAP.  Please do not delay care!   Follow Up Instructions: I discussed the assessment and treatment plan with the patient. The patient was provided an opportunity to ask  questions and all were answered. The patient agreed with the plan and demonstrated an understanding of the instructions.  A copy of instructions were sent to the patient via MyChart unless otherwise noted below.     The patient was advised to call back or seek an in-person evaluation if the symptoms worsen or if the condition fails to improve as anticipated.  Time:  I spent 11 minutes with the patient via telehealth technology discussing the above problems/concerns.    Frank [name]

## 2022-05-12 ENCOUNTER — Telehealth: Payer: Self-pay | Admitting: *Deleted

## 2022-05-12 NOTE — Telephone Encounter (Signed)
Called pt since INR is due today; there was no answer therefore, left a message.

## 2022-05-13 LAB — PROTIME-INR: INR: 2.3 — AB (ref <=1.20)

## 2022-05-14 ENCOUNTER — Ambulatory Visit (INDEPENDENT_AMBULATORY_CARE_PROVIDER_SITE_OTHER): Payer: Managed Care, Other (non HMO)

## 2022-05-14 DIAGNOSIS — Z5181 Encounter for therapeutic drug level monitoring: Secondary | ICD-10-CM | POA: Diagnosis not present

## 2022-05-14 DIAGNOSIS — Z954 Presence of other heart-valve replacement: Secondary | ICD-10-CM

## 2022-05-14 NOTE — Patient Instructions (Signed)
Description   Called and spoke with pt & instructed to continue taking warfarin 1/2 tablet daily except 1 tablet each Mondays and Fridays. Recheck INR in 6 weeks.   Coumadin Clinic # [phone]      

## 2022-06-02 ENCOUNTER — Encounter: Payer: Self-pay | Admitting: Internal Medicine

## 2022-06-02 ENCOUNTER — Other Ambulatory Visit: Payer: Self-pay | Admitting: *Deleted

## 2022-06-02 ENCOUNTER — Ambulatory Visit (INDEPENDENT_AMBULATORY_CARE_PROVIDER_SITE_OTHER): Payer: Managed Care, Other (non HMO) | Admitting: Internal Medicine

## 2022-06-02 VITALS — BP 128/70 | HR 60 | Temp 97.5°F | Resp 17 | Ht 68.5 in | Wt 177.0 lb

## 2022-06-02 DIAGNOSIS — E039 Hypothyroidism, unspecified: Secondary | ICD-10-CM

## 2022-06-02 DIAGNOSIS — I10 Essential (primary) hypertension: Secondary | ICD-10-CM

## 2022-06-02 DIAGNOSIS — E785 Hyperlipidemia, unspecified: Secondary | ICD-10-CM

## 2022-06-02 DIAGNOSIS — R7303 Prediabetes: Secondary | ICD-10-CM

## 2022-06-02 DIAGNOSIS — Z7901 Long term (current) use of anticoagulants: Secondary | ICD-10-CM | POA: Diagnosis not present

## 2022-06-02 DIAGNOSIS — Z125 Encounter for screening for malignant neoplasm of prostate: Secondary | ICD-10-CM

## 2022-06-02 DIAGNOSIS — Z954 Presence of other heart-valve replacement: Secondary | ICD-10-CM

## 2022-06-02 DIAGNOSIS — H6123 Impacted cerumen, bilateral: Secondary | ICD-10-CM | POA: Diagnosis not present

## 2022-06-02 DIAGNOSIS — R944 Abnormal results of kidney function studies: Secondary | ICD-10-CM

## 2022-06-02 DIAGNOSIS — Z0000 Encounter for general adult medical examination without abnormal findings: Secondary | ICD-10-CM

## 2022-06-02 DIAGNOSIS — R5383 Other fatigue: Secondary | ICD-10-CM

## 2022-06-02 DIAGNOSIS — Z0001 Encounter for general adult medical examination with abnormal findings: Secondary | ICD-10-CM | POA: Diagnosis not present

## 2022-06-02 DIAGNOSIS — I471 Supraventricular tachycardia, unspecified: Secondary | ICD-10-CM

## 2022-06-02 MED ORDER — HYDROCHLOROTHIAZIDE 12.5 MG PO TABS: 12.5000 mg | ORAL_TABLET | Freq: Every day | ORAL | 3 refills | Status: DC

## 2022-06-02 MED ORDER — LOSARTAN POTASSIUM 50 MG PO TABS: 50.0000 mg | ORAL_TABLET | Freq: Every day | ORAL | 3 refills | Status: DC

## 2022-06-02 MED ORDER — ALPRAZOLAM 0.5 MG PO TABS: ORAL_TABLET | ORAL | 5 refills | Status: DC

## 2022-06-02 NOTE — Progress Notes (Signed)
The patient is here for annual preventive examination and management of other chronic and acute problems.   The risk factors are reflected in the social history.   The roster of all physicians providing medical care to patient - is listed in the Snapshot section of the chart.   Activities of daily living:  The patient is 100% independent in all ADLs: dressing, toileting, feeding as well as independent mobility   Home safety : The patient has smoke detectors in the home. They wear seatbelts.  There are no unsecured firearms at home. There is no violence in the home.    There is no risks for hepatitis, STDs or HIV. There is no   history of blood transfusion. They have no travel history to infectious disease endemic areas of the world.   The patient has seen their dentist in the last six month. They have seen their eye doctor in the last year. The patinet  denies slight hearing difficulty with regard to whispered voices and some television programs.  They have deferred audiologic testing in the last year.  They do not  have excessive sun exposure. Discussed the need for sun protection: hats, long sleeves and use of sunscreen if there is significant sun exposure.    Diet: the importance of a healthy diet is discussed. They do have a healthy diet.   The benefits of regular aerobic exercise were discussed. The patient  runs/walks   5 days per week  for  60 minutes. Intentionally losing the weight   Depression screen: there are no signs or vegative symptoms of depression- irritability, change in appetite, anhedonia, sadness/tearfullness.   The following portions of the patient's history were reviewed and updated as appropriate: allergies, current medications, past family history, past medical history,  past surgical history, past social history  and problem list.   Visual acuity was not assessed per patient preference since the patient has regular follow up with an  ophthalmologist. Hearing and body  mass index were assessed and reviewed.    During the course of the visit the patient was educated and counseled about appropriate screening and preventive services including : fall prevention , diabetes screening, nutrition counseling, colorectal cancer screening, and recommended immunizations.    Chief Complaint:    None .  HTN:  taking losartan 50 metoprolol  25 mg bid and 12.5  mg hctz   Recovered from 2n d COVID infection,  became ill after returning form [place]   Cerumen impaction L >R  caused vertigo. Has been using Debrox,  right side resolved left side persistent . discussed ENT evalution   Review of Symptoms  Patient denies headache, fevers, malaise, unintentional weight loss, skin rash, eye pain, sinus congestion and sinus pain, sore throat, dysphagia,  hemoptysis , cough, dyspnea, wheezing, chest pain, palpitations, orthopnea, edema, abdominal pain, nausea, melena, diarrhea, constipation, flank pain, dysuria, hematuria, urinary  Frequency, nocturia, numbness, tingling, seizures,  Focal weakness, Loss of consciousness,  Tremor, insomnia, depression, anxiety, and suicidal ideation.    Physical Exam:  BP 128/70   Pulse 60   Temp (!) 97.5 F (36.4 C) (Oral)   Resp 17   Ht 5' 8.5" (1.74 m)   Wt 177 lb (80.3 kg)   SpO2 99%   BMI 26.52 kg/m    General appearance: alert, cooperative and appears stated age Ears: normal TM's and external ear canals both ears Throat: lips, mucosa, and tongue normal; teeth and gums normal Neck: no adenopathy, no carotid bruit, supple,  symmetrical, trachea midline and thyroid not enlarged, symmetric, no tenderness/mass/nodules Back: symmetric, no curvature. ROM normal. No CVA tenderness. Lungs: clear to auscultation bilaterally Heart: regular rate and rhythm, S1, S2 normal, aortic valve click  noted  Abdomen: soft, non-tender; bowel sounds normal; no masses,  no organomegaly Pulses: 2+ and symmetric Skin: Skin color, texture, turgor normal. No  rashes or lesions Lymph nodes: Cervical, supraclavicular, and axillary nodes normal.   Assessment and Plan:  No problem-specific Assessment & Plan notes found for this encounter.   Updated Medication List Outpatient Encounter Medications as of 06/02/2022  Medication Sig   ALPRAZolam (XANAX) 0.5 MG tablet TAKE 1 TABLET BY MOUTH EVERY DAY AT BEDTIME AS NEEDED   Ascorbic Acid (VITAMIN C) 1000 MG tablet Take 1,000 mg by mouth daily.   fluticasone (FLONASE) 50 MCG/ACT nasal spray Place 2 sprays into both nostrils daily.   hydrochlorothiazide (HYDRODIURIL) 12.5 MG tablet TAKE 1 TABLET BY MOUTH  DAILY   levothyroxine (SYNTHROID) 50 MCG tablet TAKE 1 TABLET BY MOUTH  DAILY BEFORE BREAKFAST   losartan (COZAAR) 50 MG tablet TAKE 1 TABLET BY MOUTH  DAILY   metoprolol tartrate (LOPRESSOR) 25 MG tablet TAKE 1 TABLET BY MOUTH  TWICE DAILY   promethazine-dextromethorphan (PROMETHAZINE-DM) 6.25-15 MG/5ML syrup Take 5 mLs by mouth 4 (four) times daily as needed for cough.   rosuvastatin (CRESTOR) 20 MG tablet TAKE 2 TABLETS BY MOUTH  DAILY   warfarin (COUMADIN) 5 MG tablet TAKE ONE-HALF TO 1 TABLET  BY MOUTH DAILY AS DIRECTED  BY COUMADIN CLINIC   No facility-administered encounter medications on file as of 06/02/2022.

## 2022-06-02 NOTE — Assessment & Plan Note (Signed)
TSH is pending . Taking 50 mcg Lab Results  Component Value Date   TSH 3.380 06/12/2021

## 2022-06-02 NOTE — Assessment & Plan Note (Signed)
Managed with Crestor for CAD noted during workup for AVR. repeat lipids are due   Lab Results  Component Value Date   CHOL 174 06/12/2021   HDL 43 06/12/2021   LDLCALC 105 (H) 06/12/2021   TRIG 149 06/12/2021   CHOLHDL 4.0 06/12/2021   Lab Results  Component Value Date   ALT 22 12/11/2021   AST 31 12/11/2021   ALKPHOS 57 12/11/2021   BILITOT 0.4 12/11/2021

## 2022-06-02 NOTE — Assessment & Plan Note (Signed)

## 2022-06-02 NOTE — Assessment & Plan Note (Signed)
BP improved with weight loss   Continue metoprolol , Losartan

## 2022-06-02 NOTE — Patient Instructions (Addendum)
Referral to [facility] in progress   Cologuard and TDaP due in 2024   If your insomnia gets worse,  You might want to try using Relaxium for insomnia  (as seen on TV commercials) . It contains:  Melatonin 5 mg  Chamomile 25 mg Passionflower extract 75 mg GABA 100 mg Ashwaganda extract 125 mg Magnesium citrate, glycinate, oxide (100 mg)  L tryptophan 500 mg Valerest (proprietary  ingredient ; probably valeria root extract)  You should get your INR checke during the first week of a trial

## 2022-06-05 LAB — CBC WITH DIFFERENTIAL/PLATELET
Basophils Absolute: 0.1 10*3/uL (ref 0.0–0.2)
Basos: 1 %
EOS (ABSOLUTE): 0.2 10*3/uL (ref 0.0–0.4)
Eos: 2 %
Hematocrit: 43.7 % (ref 37.5–51.0)
Hemoglobin: 15 g/dL (ref 13.0–17.7)
Immature Grans (Abs): 0 10*3/uL (ref 0.0–0.1)
Immature Granulocytes: 0 %
Lymphocytes Absolute: 1.9 10*3/uL (ref 0.7–3.1)
Lymphs: 29 %
MCH: 29.8 pg (ref 26.6–33.0)
MCHC: 34.3 g/dL (ref 31.5–35.7)
MCV: 87 fL (ref 79–97)
Monocytes Absolute: 0.7 10*3/uL (ref 0.1–0.9)
Monocytes: 11 %
Neutrophils Absolute: 3.7 10*3/uL (ref 1.4–7.0)
Neutrophils: 57 %
Platelets: 251 10*3/uL (ref 150–450)
RBC: 5.04 x10E6/uL (ref 4.14–5.80)
RDW: 13.7 % (ref 11.6–15.4)
WBC: 6.6 10*3/uL (ref 3.4–10.8)

## 2022-06-05 LAB — COMPREHENSIVE METABOLIC PANEL
ALT: 28 IU/L (ref 0–44)
AST: 32 IU/L (ref 0–40)
Albumin/Globulin Ratio: 1.9 (ref 1.2–2.2)
Albumin: 4.7 g/dL (ref 3.8–4.9)
Alkaline Phosphatase: 60 IU/L (ref 44–121)
BUN/Creatinine Ratio: 12 (ref 9–20)
BUN: 18 mg/dL (ref 6–24)
Bilirubin Total: 0.5 mg/dL (ref 0.0–1.2)
CO2: 24 mmol/L (ref 20–29)
Calcium: 9.9 mg/dL (ref 8.7–10.2)
Chloride: 100 mmol/L (ref 96–106)
Creatinine, Ser: 1.46 mg/dL — ABNORMAL HIGH (ref 0.76–1.27)
Globulin, Total: 2.5 g/dL (ref 1.5–4.5)
Glucose: 97 mg/dL (ref 70–99)
Potassium: 4.2 mmol/L (ref 3.5–5.2)
Sodium: 138 mmol/L (ref 134–144)
Total Protein: 7.2 g/dL (ref 6.0–8.5)
eGFR: 56 mL/min/{1.73_m2} — ABNORMAL LOW (ref >59)

## 2022-06-05 LAB — PSA: Prostate Specific Ag, Serum: 0.4 ng/mL (ref 0.0–4.0)

## 2022-06-05 LAB — LIPID PANEL
Chol/HDL Ratio: 4 ratio (ref 0.0–5.0)
Cholesterol, Total: 156 mg/dL (ref 100–199)
HDL: 39 mg/dL — ABNORMAL LOW (ref >39)
LDL Chol Calc (NIH): 97 mg/dL (ref 0–99)
Triglycerides: 106 mg/dL (ref 0–149)
VLDL Cholesterol Cal: 20 mg/dL (ref 5–40)

## 2022-06-05 LAB — TSH: TSH: 3.41 u[IU]/mL (ref 0.450–4.500)

## 2022-06-06 ENCOUNTER — Other Ambulatory Visit: Payer: Self-pay | Admitting: Internal Medicine

## 2022-06-07 NOTE — Addendum Note (Signed)
Addended by: [name] on: 06/07/2022 10:45 PM   Modules accepted: Orders

## 2022-06-10 ENCOUNTER — Other Ambulatory Visit: Payer: Self-pay | Admitting: Internal Medicine

## 2022-06-11 ENCOUNTER — Encounter: Payer: Self-pay | Admitting: Internal Medicine

## 2022-06-12 LAB — BASIC METABOLIC PANEL
BUN/Creatinine Ratio: 12 (ref 9–20)
BUN: 15 mg/dL (ref 6–24)
CO2: 26 mmol/L (ref 20–29)
Calcium: 10.1 mg/dL (ref 8.7–10.2)
Chloride: 99 mmol/L (ref 96–106)
Creatinine, Ser: 1.26 mg/dL (ref 0.76–1.27)
Glucose: 85 mg/dL (ref 70–99)
Potassium: 3.9 mmol/L (ref 3.5–5.2)
Sodium: 140 mmol/L (ref 134–144)
eGFR: 67 mL/min/{1.73_m2} (ref >59)

## 2022-06-27 LAB — PROTIME-INR: INR: 2.1 — AB (ref 0.80–1.20)

## 2022-06-30 ENCOUNTER — Ambulatory Visit (INDEPENDENT_AMBULATORY_CARE_PROVIDER_SITE_OTHER): Payer: Managed Care, Other (non HMO) | Admitting: Cardiology

## 2022-06-30 DIAGNOSIS — Z954 Presence of other heart-valve replacement: Secondary | ICD-10-CM

## 2022-06-30 DIAGNOSIS — Z5181 Encounter for therapeutic drug level monitoring: Secondary | ICD-10-CM | POA: Diagnosis not present

## 2022-07-04 ENCOUNTER — Other Ambulatory Visit: Payer: Self-pay | Admitting: Internal Medicine

## 2022-07-17 ENCOUNTER — Other Ambulatory Visit: Payer: Self-pay | Admitting: Podiatry

## 2022-07-17 ENCOUNTER — Ambulatory Visit (INDEPENDENT_AMBULATORY_CARE_PROVIDER_SITE_OTHER): Payer: Managed Care, Other (non HMO)

## 2022-07-17 ENCOUNTER — Ambulatory Visit: Payer: Managed Care, Other (non HMO) | Admitting: Podiatry

## 2022-07-17 DIAGNOSIS — M2142 Flat foot [pes planus] (acquired), left foot: Secondary | ICD-10-CM

## 2022-07-17 DIAGNOSIS — M2141 Flat foot [pes planus] (acquired), right foot: Secondary | ICD-10-CM

## 2022-07-17 DIAGNOSIS — M792 Neuralgia and neuritis, unspecified: Secondary | ICD-10-CM

## 2022-07-17 DIAGNOSIS — M25572 Pain in left ankle and joints of left foot: Secondary | ICD-10-CM | POA: Diagnosis not present

## 2022-07-17 DIAGNOSIS — Q666 Other congenital valgus deformities of feet: Secondary | ICD-10-CM | POA: Diagnosis not present

## 2022-07-22 ENCOUNTER — Encounter: Payer: Self-pay | Admitting: Podiatry

## 2022-07-22 NOTE — Progress Notes (Signed)
Subjective:  Patient ID: Frank [name], male    DOB: 10-19-1965,  MRN: [id number]  Chief Complaint  Patient presents with   Foot Pain    Left ankle pain for about 9 months feels like a burning pain     56 y.o. male presents with the above complaint.  Patient presents with complaint of left anterior ankle pain.  Patient states that it is a burning type of pain possible nerve impingement no recent injuries that he can think of is been going for 9 months has progressively gotten worse.  He wanted to get it evaluated he has not seen anyone else prior to seeing me for this.  He does not wear any orthotics he does a lot on his foot.   Review of Systems: Negative except as noted in the HPI. Denies N/V/F/Ch.  Past Medical History:  Diagnosis Date   Congenital heart valve abnormality    bicuspid aortic valve   Dysrhythmia    PVC's   GERD (gastroesophageal reflux disease)    Hypercholesterolemia    PONV (postoperative nausea and vomiting)    after a tonsillectomy..none since    Current Outpatient Medications:    ALPRAZolam (XANAX) 0.5 MG tablet, TAKE 1 TABLET BY MOUTH EVERY DAY AT BEDTIME AS NEEDED, Disp: 30 tablet, Rfl: 5   Ascorbic Acid (VITAMIN C) 1000 MG tablet, Take 1,000 mg by mouth daily., Disp: , Rfl:    fluticasone (FLONASE) 50 MCG/ACT nasal spray, Place 2 sprays into both nostrils daily., Disp: 16 g, Rfl: 0   hydrochlorothiazide (HYDRODIURIL) 12.5 MG tablet, TAKE 1 TABLET BY MOUTH  DAILY, Disp: 90 tablet, Rfl: 3   levothyroxine (SYNTHROID) 50 MCG tablet, TAKE 1 TABLET BY MOUTH DAILY  BEFORE BREAKFAST, Disp: 90 tablet, Rfl: 3   losartan (COZAAR) 50 MG tablet, Take 1 tablet (50 mg total) by mouth daily., Disp: 90 tablet, Rfl: 3   metoprolol tartrate (LOPRESSOR) 25 MG tablet, TAKE 1 TABLET BY MOUTH TWICE  DAILY, Disp: 180 tablet, Rfl: 3   promethazine-dextromethorphan (PROMETHAZINE-DM) 6.25-15 MG/5ML syrup, Take 5 mLs by mouth 4 (four) times daily as needed for cough., Disp: 240  mL, Rfl: 0   rosuvastatin (CRESTOR) 20 MG tablet, TAKE 2 TABLETS BY MOUTH  DAILY, Disp: 180 tablet, Rfl: 3   warfarin (COUMADIN) 5 MG tablet, TAKE ONE-HALF TO 1 TABLET  BY MOUTH DAILY AS DIRECTED  BY COUMADIN CLINIC, Disp: 90 tablet, Rfl: 3  Social History   Tobacco Use  Smoking Status Never  Smokeless Tobacco Never    No Known Allergies Objective:  There were no vitals filed for this visit. There is no height or weight on file to calculate BMI. Constitutional Well developed. Well nourished.  Vascular Dorsalis pedis pulses palpable bilaterally. Posterior tibial pulses palpable bilaterally. Capillary refill normal to all digits.  No cyanosis or clubbing noted. Pedal hair growth normal.  Neurologic Normal speech. Oriented to person, place, and time. Epicritic sensation to light touch grossly present bilaterally.  Dermatologic Nails well groomed and normal in appearance. No open wounds. No skin lesions.  Orthopedic: Left anterior ankle joint impingement noted/clinically appreciated.  No hard stop noted.  Burning sensation noted at the anterior ankle pain on palpation.  No named nerve noted.  Negative Tinel's sign of the named nerve.  No nerve compression clinically appreciated   Radiographs: 3 views of skeletally mature adult left foot: Mild osteoarthritic changes noted to the ankle joint no other bony abnormalities identified.  No fractures noted. Assessment:  1. Neuritis   2. Pes planovalgus    Plan:  Patient was evaluated and treated and all questions answered.  Left anterior ankle neuritis -All questions and concerns were discussed with the patient.  I discussed with the patient various treatment options that are available.  I offered him a steroid shot to calm the inflammation around the nerve but for now we will hold off on the injection. -I did shoe gear modification as well.  He would also benefit from orthotics as well.  Pes planovalgus -I explained to patient the  etiology of pes planovalgus and relationship with Planter fasciitis and various treatment options were discussed.  Given patient foot structure in the setting of Planter fasciitis I believe patient will benefit from custom-made orthotics to help control the hindfoot motion support the arch of the foot and take the stress away from plantar fascial.  Patient agrees with the plan like to proceed with orthotics -Patient was casted for orthotics   No follow-ups on file.

## 2022-08-07 LAB — PROTIME-INR: INR: 2.8 — AB (ref <=1.20)

## 2022-08-12 ENCOUNTER — Ambulatory Visit (INDEPENDENT_AMBULATORY_CARE_PROVIDER_SITE_OTHER): Payer: Managed Care, Other (non HMO)

## 2022-08-12 ENCOUNTER — Other Ambulatory Visit: Payer: Self-pay

## 2022-08-12 DIAGNOSIS — Z5181 Encounter for therapeutic drug level monitoring: Secondary | ICD-10-CM

## 2022-08-12 DIAGNOSIS — Z954 Presence of other heart-valve replacement: Secondary | ICD-10-CM

## 2022-08-12 NOTE — Patient Instructions (Signed)
Description   Called and spoke with pt & instructed to continue taking warfarin 1/2 tablet daily except 1 tablet each Mondays and Fridays. Recheck INR in 6 weeks.   Cou[facility]in Clinic # [phone]

## 2022-08-13 ENCOUNTER — Other Ambulatory Visit: Payer: Self-pay | Admitting: Internal Medicine

## 2022-08-13 DIAGNOSIS — Z954 Presence of other heart-valve replacement: Secondary | ICD-10-CM

## 2022-08-14 NOTE — Telephone Encounter (Signed)
Refill request for warfarin:  Last INR was 2.8 on 08/12/22 Next INR due 09/18/22 LOV was 12/31/21  Refill approved.

## 2022-08-21 ENCOUNTER — Ambulatory Visit (INDEPENDENT_AMBULATORY_CARE_PROVIDER_SITE_OTHER): Payer: Managed Care, Other (non HMO) | Admitting: Podiatry

## 2022-08-21 VITALS — BP 130/70

## 2022-08-21 DIAGNOSIS — M792 Neuralgia and neuritis, unspecified: Secondary | ICD-10-CM | POA: Diagnosis not present

## 2022-08-21 DIAGNOSIS — Q666 Other congenital valgus deformities of feet: Secondary | ICD-10-CM

## 2022-08-21 NOTE — Progress Notes (Signed)
Subjective:  Patient ID: Frank [name], male    DOB: 06/24/1966,  MRN: [id number]  No chief complaint on file.   57 y.o. male presents with the above complaint.  Patient presents with complaint left lateral anterior ankle pain.  Patient states doing a lot better no further injury or pain.  Denies any other acute complaints he is calm down.  He is also awaiting his orthotics  Review of Systems: Negative except as noted in the HPI. Denies N/V/F/Ch.  Past Medical History:  Diagnosis Date   Congenital heart valve abnormality    bicuspid aortic valve   Dysrhythmia    PVC's   GERD (gastroesophageal reflux disease)    Hypercholesterolemia    PONV (postoperative nausea and vomiting)    after a tonsillectomy..none since    Current Outpatient Medications:    ALPRAZolam (XANAX) 0.5 MG tablet, TAKE 1 TABLET BY MOUTH EVERY DAY AT BEDTIME AS NEEDED, Disp: 30 tablet, Rfl: 5   Ascorbic Acid (VITAMIN C) 1000 MG tablet, Take 1,000 mg by mouth daily., Disp: , Rfl:    fluticasone (FLONASE) 50 MCG/ACT nasal spray, Place 2 sprays into both nostrils daily., Disp: 16 g, Rfl: 0   hydrochlorothiazide (HYDRODIURIL) 12.5 MG tablet, TAKE 1 TABLET BY MOUTH  DAILY, Disp: 90 tablet, Rfl: 3   levothyroxine (SYNTHROID) 50 MCG tablet, TAKE 1 TABLET BY MOUTH DAILY  BEFORE BREAKFAST, Disp: 90 tablet, Rfl: 3   losartan (COZAAR) 50 MG tablet, Take 1 tablet (50 mg total) by mouth daily., Disp: 90 tablet, Rfl: 3   metoprolol tartrate (LOPRESSOR) 25 MG tablet, TAKE 1 TABLET BY MOUTH TWICE  DAILY, Disp: 180 tablet, Rfl: 3   promethazine-dextromethorphan (PROMETHAZINE-DM) 6.25-15 MG/5ML syrup, Take 5 mLs by mouth 4 (four) times daily as needed for cough., Disp: 240 mL, Rfl: 0   rosuvastatin (CRESTOR) 20 MG tablet, TAKE 2 TABLETS BY MOUTH  DAILY, Disp: 180 tablet, Rfl: 3   warfarin (COUMADIN) 5 MG tablet, TAKE 1/2 TO 1 TABLET BY MOUTH  DAILY AS DIRECTED BY COUMADIN  CLINIC, Disp: 90 tablet, Rfl: 1  Social History    Tobacco Use  Smoking Status Never  Smokeless Tobacco Never    No Known Allergies Objective:   Vitals:   08/21/22 0847  BP: 130/70   There is no height or weight on file to calculate BMI. Constitutional Well developed. Well nourished.  Vascular Dorsalis pedis pulses palpable bilaterally. Posterior tibial pulses palpable bilaterally. Capillary refill normal to all digits.  No cyanosis or clubbing noted. Pedal hair growth normal.  Neurologic Normal speech. Oriented to person, place, and time. Epicritic sensation to light touch grossly present bilaterally.  Dermatologic Nails well groomed and normal in appearance. No open wounds. No skin lesions.  Orthopedic: No further left anterior ankle joint impingement noted/clinically appreciated.  No hard stop noted.  No further burning sensation noted at the anterior ankle pain on palpation.  No named nerve noted.  Negative Tinel's sign of the named nerve.  No nerve compression clinically appreciated   Radiographs: 3 views of skeletally mature adult left foot: Mild osteoarthritic changes noted to the ankle joint no other bony abnormalities identified.  No fractures noted. Assessment:   No diagnosis found.  Plan:  Patient was evaluated and treated and all questions answered.  Left anterior ankle neuritis/bone bruise -All questions and concerns were discussed with the patient.  I discussed with the patient various treatment options that are available.  Clinically at this time his pain is completely resolved  I discussed shoe gear modification and prevention techniques.  If any foot and ankle issues on future he will come back and see me.  Pes planovalgus -I explained to patient the etiology of pes planovalgus and relationship with Planter fasciitis and various treatment options were discussed.  Given patient foot structure in the setting of Planter fasciitis I believe patient will benefit from custom-made orthotics to help control the  hindfoot motion support the arch of the foot and take the stress away from plantar fascial.  Patient agrees with the plan like to proceed with orthotics -Patient is awaiting a Rx   No follow-ups on file.

## 2022-08-28 ENCOUNTER — Telehealth: Payer: Self-pay | Admitting: Podiatry

## 2022-08-28 NOTE — Telephone Encounter (Signed)
Left message on vm for patient to call back to schedule picking up orthotics, they are currently in the [place] office until scheduled.   No balance

## 2022-09-16 ENCOUNTER — Ambulatory Visit: Payer: Managed Care, Other (non HMO) | Admitting: Podiatry

## 2022-09-17 LAB — PROTIME-INR
INR: 2.9 — ABNORMAL HIGH (ref 0.9–1.2)
Prothrombin Time: 28.8 s — ABNORMAL HIGH (ref 9.1–12.0)

## 2022-09-18 ENCOUNTER — Ambulatory Visit (INDEPENDENT_AMBULATORY_CARE_PROVIDER_SITE_OTHER): Payer: Managed Care, Other (non HMO) | Admitting: *Deleted

## 2022-09-18 DIAGNOSIS — Z954 Presence of other heart-valve replacement: Secondary | ICD-10-CM | POA: Diagnosis not present

## 2022-09-18 DIAGNOSIS — Z5181 Encounter for therapeutic drug level monitoring: Secondary | ICD-10-CM

## 2022-10-06 ENCOUNTER — Encounter: Payer: Self-pay | Admitting: Internal Medicine

## 2022-10-10 ENCOUNTER — Encounter: Payer: Self-pay | Admitting: Internal Medicine

## 2022-10-10 ENCOUNTER — Telehealth: Payer: Managed Care, Other (non HMO) | Admitting: Internal Medicine

## 2022-10-10 VITALS — BP 111/72 | Ht 68.5 in | Wt 172.0 lb

## 2022-10-10 DIAGNOSIS — K219 Gastro-esophageal reflux disease without esophagitis: Secondary | ICD-10-CM | POA: Diagnosis not present

## 2022-10-10 DIAGNOSIS — R5383 Other fatigue: Secondary | ICD-10-CM

## 2022-10-10 DIAGNOSIS — Z8619 Personal history of other infectious and parasitic diseases: Secondary | ICD-10-CM | POA: Diagnosis not present

## 2022-10-10 DIAGNOSIS — E663 Overweight: Secondary | ICD-10-CM

## 2022-10-10 NOTE — Progress Notes (Unsigned)
Virtual Visit via [facility]   Note   This format is felt to be most appropriate for this patient at this time.  All issues noted in this document were discussed and addressed.  No physical exam was performed (except for noted visual exam findings with Video Visits).   I connected with Frank [name]  on 10/10/22 at  8:30 AM EST by a video enabled telemedicine application and verified that I am speaking with the correct person using two identifiers. Location patient: home Location provider: work or home office Persons participating in the virtual visit: patient, provider  I discussed the limitations, risks, security and privacy concerns of performing an evaluation and management service by telephone and the availability of in person appointments. I also discussed with the patient that there may be a patient responsible charge related to this service. The patient expressed understanding and agreed to proceed.  Reason for visit: requesting H pylori testing   HPI:  57 yr old male with history of H Pylori treated in 2012 presents with request for repeat testing due to wife's recurrent  symptomatic infection   2) Fatigue: persistent.  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  No vacation in over a year due to workload and lack of support from department   ROS: See pertinent positives and negatives per HPI.  Past Medical History:  Diagnosis Date   Congenital heart valve abnormality    bicuspid aortic valve   Dysrhythmia    PVC's   GERD (gastroesophageal reflux disease)    Hypercholesterolemia    PONV (postoperative nausea and vomiting)    after a tonsillectomy..none since    Past Surgical History:  Procedure Laterality Date   ANTERIOR CRUCIATE LIGAMENT REPAIR Right 08/01/2014   Procedure: RECONSTRUCTION ANTERIOR CRUCIATE LIGAMENT (ACL) WITH HAMSTRING GRAFT, MENISCAL DEBRIDEMENT AS NEEDED.;  Surgeon: Meredith Pel, MD;  Location: [place] [facility];  Service: Orthopedics;  Laterality: Right;    BENTALL PROCEDURE N/A 11/27/2014   Procedure: BENTALL PROCEDURE;  Surgeon: [name], MD;  Location: [facility] [facility];  Service: Open Heart Surgery;  Laterality: N/A;  WITH CIRC ARREST   CARDIAC CATHETERIZATION     10/26/2014   COLONOSCOPY  2010   CORONARY ANGIOGRAM  10/26/2014   Procedure: CORONARY ANGIOGRAM;  Surgeon: [name], MD;  Location: [facility] C[facility];  Service: Cardiovascular;;   INGUINAL HERNIA REPAIR  2006   left, Dr. Bary Castilla   TEE WITHOUT CARDIOVERSION N/A 08/01/2014   Procedure: TRANSESOPHAGEAL ECHOCARDIOGRAM (TEE);  Surgeon: Meredith Pel, MD;  Location: [place];  Service: Orthopedics;  Laterality: N/A;   TEE WITHOUT CARDIOVERSION N/A 11/27/2014   Procedure: TRANSESOPHAGEAL ECHOCARDIOGRAM (TEE);  Surgeon: [name], MD;  Location: [place];  Service: Open Heart Surgery;  Laterality: N/A;   TONSILLECTOMY  2001    Family History  Problem Relation Age of Onset   Arthritis Mother        psoriatis, Crohn's    Heart disease Father        CAD in his early 4s    SOCIAL HX: ***   Current Outpatient Medications:    ALPRAZolam (XANAX) 0.5 MG tablet, TAKE 1 TABLET BY MOUTH EVERY DAY AT BEDTIME AS NEEDED, Disp: 30 tablet, Rfl: 5   Ascorbic Acid (VITAMIN C) 1000 MG tablet, Take 1,000 mg by mouth daily., Disp: , Rfl:    levothyroxine (SYNTHROID) 50 MCG tablet, TAKE 1 TABLET BY MOUTH DAILY  BEFORE BREAKFAST, Disp: 90 tablet, Rfl: 3   losartan (COZAAR)  50 MG tablet, Take 1 tablet (50 mg total) by mouth daily., Disp: 90 tablet, Rfl: 3   metoprolol tartrate (LOPRESSOR) 25 MG tablet, TAKE 1 TABLET BY MOUTH TWICE  DAILY, Disp: 180 tablet, Rfl: 3   rosuvastatin (CRESTOR) 20 MG tablet, TAKE 2 TABLETS BY MOUTH  DAILY, Disp: 180 tablet, Rfl: 3   warfarin (COUMADIN) 5 MG tablet, TAKE 1/2 TO 1 TABLET BY MOUTH  DAILY AS DIRECTED BY COUMADIN  CLINIC, Disp: 90 tablet, Rfl: 1   fluticasone (FLONASE) 50 MCG/ACT nasal spray, Place 2 sprays into both nostrils daily. (Patient not taking: Reported on  10/10/2022), Disp: 16 g, Rfl: 0  EXAM:  VITALS per patient if applicable:  GENERAL: alert, oriented, appears well and in no acute distress  HEENT: atraumatic, conjunttiva clear, no obvious abnormalities on inspection of external nose and ears  NECK: normal movements of the head and neck  LUNGS: on inspection no signs of respiratory distress, breathing rate appears normal, no obvious gross SOB, gasping or wheezing  CV: no obvious cyanosis  MS: moves all visible extremities without noticeable abnormality  PSYCH/NEURO: pleasant and cooperative, no obvious depression or anxiety, speech and thought processing grossly intact  ASSESSMENT AND PLAN: There are no diagnoses linked to this encounter.    I discussed the assessment and treatment plan with the patient. The patient was provided an opportunity to ask questions and all were answered. The patient agreed with the plan and demonstrated an understanding of the instructions.   The patient was advised to call back or seek an in-person evaluation if the symptoms worsen or if the condition fails to improve as anticipated.   I spent 30 minutes dedicated to the care of this patient on the date of this encounter to include pre-visit review of his medical history,  Face-to-face time with the patient , and post visit ordering of testing and therapeutics.    [name], MD

## 2022-10-10 NOTE — Assessment & Plan Note (Signed)
Requesting repeat testing using breath test due to wife's recurrent symptomatic infections .  Will discuss results with with [facility]

## 2022-10-10 NOTE — Assessment & Plan Note (Signed)
Has been managing without PPI for years. H/o  H PYlori in 2012,  treated . Rechecking at patient's request

## 2022-10-10 NOTE — Assessment & Plan Note (Signed)
Accompanied by short term memory concerns.  Checking B12/Folate/iron and CMET .  Discussed lack of work Scientist, research (medical) , need for rest

## 2022-10-12 NOTE — Assessment & Plan Note (Signed)
He has ost 11 lbs in the last year using a low glycemic index diet .

## 2022-10-12 NOTE — Assessment & Plan Note (Addendum)
Accompanied by short term memory concerns. No evidence of anemia,  sleep disorder.  Takes metoprolol 25 mg bid for management of SVT Checking B12/Folate/iron and CMET .  Discussed lack of work /life balance , need for rest.  If lalbs normal   consider change to Calcium channel blocker

## 2022-10-15 ENCOUNTER — Telehealth: Payer: Managed Care, Other (non HMO) | Admitting: Internal Medicine

## 2022-10-16 LAB — COMPREHENSIVE METABOLIC PANEL
ALT: 39 IU/L (ref 0–44)
AST: 41 IU/L — ABNORMAL HIGH (ref 0–40)
Albumin/Globulin Ratio: 1.9 (ref 1.2–2.2)
Albumin: 5.2 g/dL — ABNORMAL HIGH (ref 3.8–4.9)
Alkaline Phosphatase: 67 IU/L (ref 44–121)
BUN/Creatinine Ratio: 9 (ref 9–20)
BUN: 12 mg/dL (ref 6–24)
Bilirubin Total: 0.5 mg/dL (ref 0.0–1.2)
CO2: 21 mmol/L (ref 20–29)
Calcium: 10.1 mg/dL (ref 8.7–10.2)
Chloride: 100 mmol/L (ref 96–106)
Creatinine, Ser: 1.29 mg/dL — ABNORMAL HIGH (ref 0.76–1.27)
Globulin, Total: 2.8 g/dL (ref 1.5–4.5)
Glucose: 96 mg/dL (ref 70–99)
Potassium: 4.4 mmol/L (ref 3.5–5.2)
Sodium: 139 mmol/L (ref 134–144)
Total Protein: 8 g/dL (ref 6.0–8.5)
eGFR: 48 mL/min/{1.73_m2} — ABNORMAL LOW (ref >59)

## 2022-10-16 LAB — B12 AND FOLATE PANEL
Folate: 18 ng/mL (ref >3.0)
Vitamin B-12: 507 pg/mL (ref 232–1245)

## 2022-10-16 LAB — IRON,TIBC AND FERRITIN PANEL
Ferritin: 128 ng/mL (ref 30–400)
Iron Saturation: 20 % (ref 15–55)
Iron: 80 ug/dL (ref 38–169)
Total Iron Binding Capacity: 395 ug/dL (ref 250–450)
UIBC: 315 ug/dL (ref 111–343)

## 2022-10-16 LAB — VITAMIN D 25 HYDROXY (VIT D DEFICIENCY, FRACTURES): Vit D, 25-Hydroxy: 30 ng/mL (ref 30.0–100.0)

## 2022-10-17 LAB — SPECIMEN STATUS REPORT

## 2022-10-17 LAB — H. PYLORI BREATH COLLECTION

## 2022-10-17 LAB — H. PYLORI BREATH TEST: H pylori Breath Test: POSITIVE — AB

## 2022-10-28 ENCOUNTER — Telehealth: Payer: Self-pay | Admitting: *Deleted

## 2022-10-28 NOTE — Telephone Encounter (Signed)
Left message for the patient to have INR drawn. Will follow up with pt at later time/date.

## 2022-10-30 LAB — PROTIME-INR: INR: 2.6 — AB (ref 0.80–1.20)

## 2022-10-31 ENCOUNTER — Ambulatory Visit (INDEPENDENT_AMBULATORY_CARE_PROVIDER_SITE_OTHER): Payer: Managed Care, Other (non HMO) | Admitting: *Deleted

## 2022-10-31 DIAGNOSIS — Z5181 Encounter for therapeutic drug level monitoring: Secondary | ICD-10-CM

## 2022-10-31 DIAGNOSIS — Z954 Presence of other heart-valve replacement: Secondary | ICD-10-CM

## 2022-12-02 ENCOUNTER — Other Ambulatory Visit: Payer: Self-pay | Admitting: Internal Medicine

## 2022-12-11 ENCOUNTER — Telehealth: Payer: Self-pay | Admitting: *Deleted

## 2022-12-11 NOTE — Telephone Encounter (Signed)
Called pt since INR is due; there was no answer therefore left a message for him to call back. Will await and follow up.  

## 2022-12-12 ENCOUNTER — Telehealth: Payer: Self-pay | Admitting: *Deleted

## 2022-12-12 DIAGNOSIS — Z5181 Encounter for therapeutic drug level monitoring: Secondary | ICD-10-CM

## 2022-12-12 DIAGNOSIS — Z954 Presence of other heart-valve replacement: Secondary | ICD-10-CM

## 2022-12-12 NOTE — Telephone Encounter (Signed)
Pt called and stated that needs a new order to have his INR checked at [facility]. Pt stated that he will be going on 12/18/2022 to have INR checked.  New order placed for pt/INR.

## 2022-12-13 LAB — PROTIME-INR
INR: 3.1 — ABNORMAL HIGH (ref 0.9–1.2)
Prothrombin Time: 30.7 s — ABNORMAL HIGH (ref 9.1–12.0)

## 2022-12-19 ENCOUNTER — Telehealth: Payer: Self-pay

## 2022-12-19 ENCOUNTER — Ambulatory Visit (INDEPENDENT_AMBULATORY_CARE_PROVIDER_SITE_OTHER): Payer: Managed Care, Other (non HMO)

## 2022-12-19 DIAGNOSIS — Z954 Presence of other heart-valve replacement: Secondary | ICD-10-CM

## 2022-12-19 DIAGNOSIS — Z5181 Encounter for therapeutic drug level monitoring: Secondary | ICD-10-CM

## 2022-12-19 NOTE — Patient Instructions (Signed)
Description   Called and spoke with pt. Pt had PT/INR drawn on 12/12/22 and ate large salad since INR was 3.1. Instructed pt to continue taking warfarin 1/2 tablet daily except 1 tablet each Mondays and Fridays. Recheck INR in 6 weeks from last INR (12/12/22).   [facility] # [phone]

## 2022-12-19 NOTE — Telephone Encounter (Signed)
INR overdue. Called pt, no answer. Left detailed message on voicemail with call back number.

## 2022-12-28 ENCOUNTER — Other Ambulatory Visit: Payer: Self-pay | Admitting: Internal Medicine

## 2023-01-14 ENCOUNTER — Other Ambulatory Visit: Payer: Self-pay | Admitting: Internal Medicine

## 2023-01-14 DIAGNOSIS — Z954 Presence of other heart-valve replacement: Secondary | ICD-10-CM

## 2023-01-14 NOTE — Telephone Encounter (Signed)
Prescription refill request received for warfarin [place]: 12/31/21 [facility])  - pt has scheduled appt with Dr Rennis Golden on 03/23/23 Next INR check: 01/22/23 Warfarin tablet strength: 5mg   Appropriate dose. Refill sent.

## 2023-01-17 ENCOUNTER — Other Ambulatory Visit: Payer: Self-pay | Admitting: Internal Medicine

## 2023-01-22 ENCOUNTER — Ambulatory Visit (INDEPENDENT_AMBULATORY_CARE_PROVIDER_SITE_OTHER): Payer: Managed Care, Other (non HMO) | Admitting: *Deleted

## 2023-01-22 DIAGNOSIS — Z954 Presence of other heart-valve replacement: Secondary | ICD-10-CM

## 2023-01-22 DIAGNOSIS — Z5181 Encounter for therapeutic drug level monitoring: Secondary | ICD-10-CM

## 2023-01-22 LAB — PROTIME-INR
INR: 2.5 — ABNORMAL HIGH (ref 0.9–1.2)
Prothrombin Time: 25.3 s — ABNORMAL HIGH (ref 9.1–12.0)

## 2023-01-22 NOTE — Patient Instructions (Signed)
Description   Called and spoke with pt and instructed pt to continue taking warfarin 1/2 tablet daily except 1 tablet each Mondays and Fridays. Recheck INR in 6 weeks at [facility].   Coumadin Clinic # [phone]

## 2023-02-20 ENCOUNTER — Other Ambulatory Visit: Payer: Self-pay | Admitting: Family

## 2023-03-03 ENCOUNTER — Encounter: Payer: Self-pay | Admitting: Internal Medicine

## 2023-03-03 DIAGNOSIS — Z954 Presence of other heart-valve replacement: Secondary | ICD-10-CM

## 2023-03-03 DIAGNOSIS — Z5181 Encounter for therapeutic drug level monitoring: Secondary | ICD-10-CM

## 2023-03-09 ENCOUNTER — Telehealth: Payer: Self-pay

## 2023-03-09 NOTE — Telephone Encounter (Signed)
Lpm to check INR, if haven't already.

## 2023-03-10 ENCOUNTER — Ambulatory Visit (INDEPENDENT_AMBULATORY_CARE_PROVIDER_SITE_OTHER): Payer: Managed Care, Other (non HMO) | Admitting: Cardiovascular Disease

## 2023-03-10 DIAGNOSIS — Z5181 Encounter for therapeutic drug level monitoring: Secondary | ICD-10-CM

## 2023-03-10 DIAGNOSIS — Z954 Presence of other heart-valve replacement: Secondary | ICD-10-CM

## 2023-03-10 LAB — PROTIME-INR
INR: 3.5 — ABNORMAL HIGH (ref 0.9–1.2)
Prothrombin Time: 35.1 s — ABNORMAL HIGH (ref 9.1–12.0)

## 2023-03-16 ENCOUNTER — Encounter: Payer: Self-pay | Admitting: Internal Medicine

## 2023-03-16 MED ORDER — LOSARTAN POTASSIUM 50 MG PO TABS: 50.0000 mg | ORAL_TABLET | Freq: Every day | ORAL | 3 refills | Status: DC

## 2023-03-22 ENCOUNTER — Other Ambulatory Visit: Payer: Self-pay | Admitting: Internal Medicine

## 2023-03-23 ENCOUNTER — Encounter: Payer: Self-pay | Admitting: Internal Medicine

## 2023-03-23 ENCOUNTER — Ambulatory Visit: Payer: Managed Care, Other (non HMO) | Attending: Internal Medicine | Admitting: Internal Medicine

## 2023-03-23 VITALS — BP 120/80 | HR 59 | Ht 70.0 in | Wt 174.4 lb

## 2023-03-23 DIAGNOSIS — Z9889 Other specified postprocedural states: Secondary | ICD-10-CM

## 2023-03-23 DIAGNOSIS — I2510 Atherosclerotic heart disease of native coronary artery without angina pectoris: Secondary | ICD-10-CM

## 2023-03-23 DIAGNOSIS — Z954 Presence of other heart-valve replacement: Secondary | ICD-10-CM

## 2023-03-23 DIAGNOSIS — I471 Supraventricular tachycardia, unspecified: Secondary | ICD-10-CM | POA: Diagnosis not present

## 2023-03-23 DIAGNOSIS — I10 Essential (primary) hypertension: Secondary | ICD-10-CM | POA: Diagnosis not present

## 2023-03-23 DIAGNOSIS — Z7901 Long term (current) use of anticoagulants: Secondary | ICD-10-CM

## 2023-03-23 MED ORDER — ROSUVASTATIN CALCIUM 40 MG PO TABS: 40.0000 mg | ORAL_TABLET | Freq: Every day | ORAL | 3 refills | Status: DC

## 2023-03-23 NOTE — Patient Instructions (Addendum)
Medication Instructions:  Rosuvastatin changed to 40mg  tablet. *If you need a refill on your cardiac medications before your next appointment, please call your pharmacy*   Lab Work: No labs If you have labs (blood work) drawn today and your tests are completely normal, you will receive your results only by: MyChart Message (if you have MyChart) OR A paper copy in the mail If you have any lab test that is abnormal or we need to change your treatment, we will call you to review the results.   Testing/Procedures: No testing   Follow-Up: At [facility], you and your health needs are our priority.  As part of our continuing mission to provide you with exceptional heart care, we have created designated Provider Care Teams.  These Care Teams include your primary Cardiologist (physician) and Advanced Practice Providers (APPs -  Physician Assistants and Nurse Practitioners) who all work together to provide you with the care you need, when you need it.   Your next appointment:   1 year(s)  Provider:   Chrystie Nose, MD

## 2023-03-23 NOTE — Progress Notes (Signed)
OFFICE NOTE  Chief Complaint:  No complaints  Primary Care Physician: Sherlene Shams, MD  HPI:  Frank [name] is a 57 y.o. male with a history of at least moderate AS (followed by Dr. Juliann Pares at the [facility]), presented for ACL repair with Dr. August Saucer. Preoperatively, he was evaluated by Dr. Maple Hudson (cardiac anesthesia) and found to have a loud AS murmur- After some discussion, interoperative TEE was recommended and performed. I personally reviewed the TEE images which do demonstrate severe aortic stenosis, moderate AI and mild to moderate MR (obtained during surgery, on pressor, PPV, etc, but still significant). He was recently told he probably had another 5 years before needing to consider surgery. Mean gradient is already above 40 mmHg.  Clinically, he has been asymptomatic (no chest pain, dyspnea, syncope, etc,) but recently he has had more palpitations (PVC's) and was started on b-blocker.  He did manage to get through surgery uneventfully.    He returns today in follow-up to the office. He continues to deny any chest pain, worsening shortness of breath, presyncope or syncopal episodes. He has not pushed himself significantly due to his recent knee surgery. He is undergoing rehabilitation and getting stronger. A repeat echocardiogram was just performed as an outpatient. This was initially read by one of my partners and indicated mild to moderate aortic stenosis based on a mean valve gradient in the 20s. However subsequent images during the study do indicate mean gradients greater than 50 from the right upper sternal border using Pedoff probe. I therefore personally reviewed the echocardiogram images and made the appropriate corrections,  and the results are as follows:   Study Conclusions  - Left ventricle: The cavity size was normal. Wall thickness was   increased in a pattern of mild LVH. Systolic function was normal.   The estimated ejection fraction was in the range of 55% to  60%. - Aortic valve: The aortic valve is bicuspid and is heavily   calcified with restricted leaflet motion. Peak and mean gradients   through the valve are 100 and 59 mm Hg respectively, consistent   with severe AS. The calculated AVA is 0.6 cm2. There is moderate,   eccentric AI. There was mild regurgitation. - Aorta: Aortic root dimension: 39 mm (ED). - Aortic root: The aortic root is mildly dilated. - Mitral valve: Calcified annulus. Mildly thickened leaflets .   There was mild regurgitation. - Left atrium: LA Volume/BSA= 46.4 ml/m2. The atrium is moderately   dilated.  Impressions:  - LVEF 60-65%, moderate concentric LVH, bicuspid and severely   stenotic aortic valve- AVA 0.6 cm2, peak and mean gradients of   100 mmHg and 59 mmHg, respectively - LVOT diameter measured at   2.4 cm. There is moderate AI. There is mild to moderate MR with   restricted anterior leaflet motion secondary to eccentric AI.   Pedoff measurements do indeed show a very high mean gradient of 59 and peak gradient of 100 mmHg. Based on a generous LVOT diameter of 2.4 cm, the calculated aortic valve gradient was 0.6 cm consistent with severe aortic stenosis.   I saw Frank [name] back in the office today. He was recently on a trip and noticed increasing shortness of breath and easy fatigue with exercise which was a new finding for him. He feels that this is symptomatic aortic stenosis and I would agree. I discussed the case further with Dr. Laneta Simmers, the cardiac thoracic surgeon that he's been seeing, and we feel  that it's probably the time now to entertain valve replacement. Based on that there is also concern about aortopathy given his bicuspid valve. He underwent a CT scan of the aorta and coronaries which showed does reveal multivessel coronary artery calcification as well as calcium deposits in the left main coronary. The aortic valve is heavily calcified and restricted in motion. There was at least mild cardiac  hypertrophy and dilatation of the left ventricle. The ascending aorta is dilated measuring 4.3-4.4 cm. There is no evidence for coarctation or enlargement of the thoracic or descending aorta.   [name] returns today for follow-up. He successfully underwent Bentall procedure with hemi-arch replacement using a 28 mm Hemashield graft under deep hypothermic circulatory arrest and Bentall Procedure ( replacement of aortic valve and aortic root with reimplantation of the coronary arteries) using a 21 mm St. Jude mechanical valve graft.  Overall he is doing exceedingly well. He continues to do exercise. He is also recovering from recent knee surgery. He denies any chest pain or worsening shortness of breath. His energy levels improved significantly. His median sternotomy is healing well. He did have some postoperative anemia however this seems to have improved and his energy level is better.   I saw Frank [name] back in the office today for follow-up. He is doing exceedingly well. He's been active and working and traveling without any restrictions. He denies any chest pain or shortness of breath. He's had a small amount of weight gain however BMI is only 26. He feels better than he has in years he says. Recently we increased his cholesterol medication as his LDL was still not treated to goal. Particle number was just over 1100. He is due for recheck of this next month. He says he does get some mild muscle soreness after exercise that does not resolve quickly which he may attribute to increase in Crestor.   01/16/2016   [name] returns today for follow-up. He reports that he is doing exceedingly well. His last use been excellent for him. He is exercising more regularly. He's had a small amount of weight gain but is less than 5 pounds and is starting to come down. He denies any chest pain or shortness of breath. His blood pressure is slightly higher recently however and he was started on losartan 50 mg daily.  Initial blood pressure today was 130/90 however recheck was 110/80 in the left arm and 112/82 in the right arm. He was concerned about some differential blood pressures in the past however this difference is negligible. In discussion today we realized he had not had an echocardiogram since his valve surgery and we need to establish a baseline valve gradient.  08/22/2016  [name] returns today for follow-up. He says is feeling exceedingly well. He exercises regularly now and has a better energy level. He denies any shortness of breath or chest pain. He's been compliant with his INR checks and is Coumadin levels have been appropriate. Blood pressure appears well controlled. He has excellent cholesterol control with LDL in the 80s on rosuvastatin.   08/28/2017  [name] is doing well and is without complaints. He is pleased with his AVR. He exercises regularly and has gained some muscle mass. He has maintained a therapeutic INR. He had an echo this past Summer which demonstrated a stable aortic valve gradient and normal LV function. BP is well controlled. Recent labs show LDL of 81 on Crestor.  09/02/2018  [name] is seen today in follow-up.  He is doing extremely well.  He says he is not felt this great in years.  He was struggling with some palpitations however is added some supplementary potassium to his diet which is helped his symptoms.  He has been therapeutic on warfarin which is been stable.  Weight is stable.  Blood pressure is now well controlled.  He denies any chest pain or shortness of breath.  He exercises several times a week.  09/21/2019  [name] returns today for annual follow-up.  He recently underwent an echo which showed normal systolic function, preserved mechanical aortic valve gradient and stable ascending aortic diameters.  Symptomatically he feels well.  He has no new complaints is worsening shortness of breath or chest pain.  He had some recent labs but is not had  reassessment of his lipids or thyroid.  He is on a low-dose of thyroid replacement.  He has been concerned about some recent development of gynecomastia.  He underwent endocrine testing for this which was unremarkable.  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  11/01/2020  [name] is seen today in follow-up.  Overall he says this past year has been the best so far.  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  This was associated with significant bleeding and hematoma due to being on warfarin.  It was not surgically repaired.  He has recovered from it.  Other than that he is doing well.  His INRs have been very stable.  He had an echo last year which showed stable aortic size and normal gradients across his mechanical aortic valve.  Blood pressure is normal.  Weight is stable.  12/31/2021  [name] is seen today in follow-up.  Overall he says he is feeling well.  He exercises every day and has no complaints such as chest pain or worsening shortness of breath.  Recent repeat echo which shows a stable mechanical aortic valve gradient and normal LV function.  He is compliant with dental prophylaxis.  INR has been very stable.  I had performed heart catheterization on him in March 2016 which showed only mild luminal irregularities.  Subsequently he has had good control of her his blood pressure and cholesterol.  Recent LDL was 105 on rosuvastatin 40 mg daily.  03/23/2023  [name] returns today for follow-up.  He says he is feeling exceptionally well.  He is exercising regularly.  He denies any chest pain or shortness of breath.  He has had no palpitations.  His INRs have been fairly stable with some slightly higher INR's recently however he is very compliant with his medication on diet.  Uses dental prophylaxis.  His last echo last year showed stable aortic valve gradients.  He is aortic arch repair  has been stable.  He had repeat lipids from his primary care provider.  Total cholesterol 141, triglycerides 109, HDL 44 and LDL 77 on 40 mg rosuvastatin.  He is tolerating this well.  His A1c was 6.0% however fasting glucose was 99.   PMHx:  Past Medical History:  Diagnosis Date   Congenital heart valve abnormality    bicuspid aortic valve   Dysrhythmia    PVC's   GERD (gastroesophageal reflux disease)    Hypercholesterolemia    PONV (postoperative nausea and vomiting)    after a tonsillectomy..none since    Past Surgical History:  Procedure Laterality Date   ANTERIOR CRUCIATE  LIGAMENT REPAIR Right 08/01/2014   Procedure: RECONSTRUCTION ANTERIOR CRUCIATE LIGAMENT (ACL) WITH HAMSTRING GRAFT, MENISCAL DEBRIDEMENT AS NEEDED.;  Surgeon: Cammy Copa, MD;  Location: [facility] OR;  Service: Orthopedics;  Laterality: Right;   BENTALL PROCEDURE N/A 11/27/2014   Procedure: BENTALL PROCEDURE;  Surgeon: Alleen Borne, MD;  Location: [facility] OR;  Service: Open Heart Surgery;  Laterality: N/A;  WITH CIRC ARREST   CARDIAC CATHETERIZATION     10/26/2014   COLONOSCOPY  2010   CORONARY ANGIOGRAM  10/26/2014   Procedure: CORONARY ANGIOGRAM;  Surgeon: Chrystie Nose, MD;  Location: [facility] CATH LAB;  Service: Cardiovascular;;   INGUINAL HERNIA REPAIR  2006   left, Dr. Lemar Livings   TEE WITHOUT CARDIOVERSION N/A 08/01/2014   Procedure: TRANSESOPHAGEAL ECHOCARDIOGRAM (TEE);  Surgeon: Cammy Copa, MD;  Location: [facility] OR;  Service: Orthopedics;  Laterality: N/A;   TEE WITHOUT CARDIOVERSION N/A 11/27/2014   Procedure: TRANSESOPHAGEAL ECHOCARDIOGRAM (TEE);  Surgeon: Alleen Borne, MD;  Location: [facility] OR;  Service: Open Heart Surgery;  Laterality: N/A;   TONSILLECTOMY  2001    FAMHx:  Family History  Problem Relation Age of Onset   Arthritis Mother        psoriatis, Crohn's    Heart disease Father        CAD in his early 73s    SOCHx:   reports that he has never smoked. He has never used smokeless tobacco.  He reports current alcohol use of about 7.0 standard drinks of alcohol per week. He reports that he does not use drugs.  ALLERGIES:  No Known Allergies  ROS: Pertinent items noted in HPI and remainder of comprehensive ROS otherwise negative.  HOME MEDS: Current Outpatient Medications on File Prior to Visit  Medication Sig Dispense Refill   ALPRAZolam (XANAX) 0.5 MG tablet TAKE 1 TABLET BY MOUTH EVERY DAY AT BEDTIME AS NEEDED 30 tablet 5   Ascorbic Acid (VITAMIN C) 1000 MG tablet Take 1,000 mg by mouth daily.     fluticasone (FLONASE) 50 MCG/ACT nasal spray Place 2 sprays into both nostrils daily. 16 g 0   levothyroxine (SYNTHROID) 50 MCG tablet TAKE 1 TABLET BY MOUTH DAILY  BEFORE BREAKFAST 90 tablet 3   losartan (COZAAR) 50 MG tablet Take 1 tablet (50 mg total) by mouth daily. 90 tablet 3   metoprolol tartrate (LOPRESSOR) 25 MG tablet TAKE 1 TABLET BY MOUTH TWICE  DAILY 180 tablet 3   rosuvastatin (CRESTOR) 20 MG tablet Take 2 tablets (40 mg total) by mouth daily. Patient needs to keep upcoming appointment with provider for future refills. 180 tablet 0   warfarin (COUMADIN) 5 MG tablet TAKE 1/2 TO 1 TABLET BY MOUTH  DAILY AS DIRECTED BY COUMADIN  CLINIC 70 tablet 3   No current facility-administered medications on file prior to visit.    LABS/IMAGING: No results found for this or any previous visit (from the past 48 hour(s)). No results found.  WEIGHTS: Wt Readings from Last 3 Encounters:  03/23/23 174 lb 6.4 oz (79.1 kg)  10/10/22 172 lb (78 kg)  06/02/22 177 lb (80.3 kg)    VITALS: BP 120/80 (BP Location: Right Arm, Patient Position: Sitting, Cuff Size: Normal)   Pulse (!) 59   Ht 5\' 10"  (1.778 m)   Wt 174 lb 6.4 oz (79.1 kg)   SpO2 99%   BMI 25.02 kg/m   EXAM: General appearance: alert and no distress Neck: no carotid bruit and no JVD Lungs: clear to auscultation bilaterally  Heart: regular rate and rhythm, S1, S2 normal and Sharp mechanical valve sounds Abdomen:  soft, non-tender; bowel sounds normal; no masses,  no organomegaly Extremities: extremities normal, atraumatic, no cyanosis or edema Pulses: 2+ and symmetric Skin: Skin color, texture, turgor normal. No rashes or lesions Neurologic: Grossly normal I: Pleasant  EKG: EKG Interpretation Date/Time:  Monday March 23 2023 08:18:28 EDT Ventricular Rate:  59 PR Interval:  156 QRS Duration:  136 QT Interval:  442 QTC Calculation: 437 R Axis:   75  Text Interpretation: Sinus bradycardia Right bundle branch block When compared with ECG of 28-Nov-2014 07:16, Vent. rate has decreased BY  29 BPM Right bundle branch block is now Present Confirmed by Zoila Shutter [phone]) on 03/23/2023 8:24:49 AM    ASSESSMENT: Status post aVR with the mechanical valve (11/27/2014) Anticoagulated on warfarin Bentall hemi-arch repair for ascending aortic aneurysm (11/27/2014) Mild nonobstructive coronary disease Dyslipidemia History of bicuspid aortic valve with aortopathy RBBB  PLAN: 1.   [name] continues to do very well.  His LDL is now down to 77 on a total of 40 mg rosuvastatin daily.  Will continue this and I will prescribe a 40 mg tablet.  His last echo last year showed stable valve gradients.  His EKG is stable with a right bundle branch block.  Heart rate is stable with exercise and blood pressure is well-controlled at 120/80 today.  No changes to his medications.  Plan follow-up with me annually or sooner as necessary.  Chrystie Nose, MD, [facility], FACP  [place]  [facility]  Medical Director of the [facility] Lipid Disorders &  Cardiovascular Risk Reduction Clinic Diplomate of the American Board of Clinical Lipidology Attending Cardiologist  Direct Dial: [phone]  Fax: [phone]  Website:  [email or web address]  03/23/2023, 8:24 AM

## 2023-03-23 NOTE — Telephone Encounter (Signed)
Medication was filled by provider today

## 2023-04-06 ENCOUNTER — Other Ambulatory Visit: Payer: Self-pay | Admitting: *Deleted

## 2023-04-06 ENCOUNTER — Telehealth: Payer: Self-pay | Admitting: *Deleted

## 2023-04-06 DIAGNOSIS — Z5181 Encounter for therapeutic drug level monitoring: Secondary | ICD-10-CM

## 2023-04-06 DIAGNOSIS — Z954 Presence of other heart-valve replacement: Secondary | ICD-10-CM

## 2023-04-06 NOTE — Telephone Encounter (Signed)
Called pt since INR is due. There was no answer so left a message and released INR order as well. Will await and follow up.

## 2023-04-07 ENCOUNTER — Ambulatory Visit (INDEPENDENT_AMBULATORY_CARE_PROVIDER_SITE_OTHER): Payer: Managed Care, Other (non HMO) | Admitting: Cardiology

## 2023-04-07 ENCOUNTER — Telehealth: Payer: Self-pay

## 2023-04-07 DIAGNOSIS — Z5181 Encounter for therapeutic drug level monitoring: Secondary | ICD-10-CM | POA: Diagnosis not present

## 2023-04-07 DIAGNOSIS — Z954 Presence of other heart-valve replacement: Secondary | ICD-10-CM

## 2023-04-07 LAB — PROTIME-INR
INR: 2.5 — ABNORMAL HIGH (ref 0.9–1.2)
Prothrombin Time: 26.1 s — ABNORMAL HIGH (ref 9.1–12.0)

## 2023-04-07 NOTE — Telephone Encounter (Signed)
Lpmtcb and discuss INR result. °

## 2023-04-22 ENCOUNTER — Telehealth: Payer: Self-pay | Admitting: Internal Medicine

## 2023-05-04 ENCOUNTER — Other Ambulatory Visit: Payer: Self-pay

## 2023-05-04 ENCOUNTER — Telehealth: Payer: Self-pay

## 2023-05-04 DIAGNOSIS — Z954 Presence of other heart-valve replacement: Secondary | ICD-10-CM

## 2023-05-04 DIAGNOSIS — Z5181 Encounter for therapeutic drug level monitoring: Secondary | ICD-10-CM

## 2023-05-04 NOTE — Telephone Encounter (Signed)
Lpm to have PT/INR drawn today.

## 2023-05-05 ENCOUNTER — Telehealth: Payer: Self-pay

## 2023-05-05 ENCOUNTER — Ambulatory Visit (INDEPENDENT_AMBULATORY_CARE_PROVIDER_SITE_OTHER): Payer: Managed Care, Other (non HMO) | Admitting: Cardiology

## 2023-05-05 DIAGNOSIS — Z954 Presence of other heart-valve replacement: Secondary | ICD-10-CM

## 2023-05-05 DIAGNOSIS — Z5181 Encounter for therapeutic drug level monitoring: Secondary | ICD-10-CM | POA: Diagnosis not present

## 2023-05-05 NOTE — Telephone Encounter (Signed)
Lpmtcb and discuss INR result.

## 2023-05-11 NOTE — Telephone Encounter (Signed)
Document has been printed and placed in quick sign folder for signature.

## 2023-05-11 NOTE — Telephone Encounter (Signed)
Pharmacy just called and said they need PCP approval to change the manufacturer for the medication. Pharmacy said they faxed it over. Referernce # [phone]

## 2023-05-12 ENCOUNTER — Telehealth: Payer: Self-pay | Admitting: Internal Medicine

## 2023-05-12 NOTE — Telephone Encounter (Signed)
[facility] changing manufacturer for the patient's synthroid. Per the provider it is ok. I spoke with the pharmacist and relayed the provider's response. They will ship out the patient's medication.

## 2023-05-12 NOTE — Telephone Encounter (Signed)
Form has been faxed.

## 2023-05-16 ENCOUNTER — Other Ambulatory Visit: Payer: Self-pay | Admitting: Internal Medicine

## 2023-06-01 ENCOUNTER — Other Ambulatory Visit: Payer: Self-pay

## 2023-06-01 ENCOUNTER — Telehealth: Payer: Self-pay | Admitting: *Deleted

## 2023-06-01 DIAGNOSIS — Z5181 Encounter for therapeutic drug level monitoring: Secondary | ICD-10-CM

## 2023-06-01 DIAGNOSIS — Z954 Presence of other heart-valve replacement: Secondary | ICD-10-CM

## 2023-06-01 NOTE — Telephone Encounter (Signed)
Called pt since INR is due; he states he has an appt tomorrow and might even be able to go later today. Will follow up with him once labs received and he verbalized understanding.

## 2023-06-02 ENCOUNTER — Ambulatory Visit (INDEPENDENT_AMBULATORY_CARE_PROVIDER_SITE_OTHER): Payer: Managed Care, Other (non HMO) | Admitting: Cardiology

## 2023-06-02 DIAGNOSIS — Z5181 Encounter for therapeutic drug level monitoring: Secondary | ICD-10-CM

## 2023-06-02 DIAGNOSIS — Z954 Presence of other heart-valve replacement: Secondary | ICD-10-CM | POA: Diagnosis not present

## 2023-06-02 LAB — PROTIME-INR
INR: 2.4 — ABNORMAL HIGH (ref 0.9–1.2)
Prothrombin Time: 24.8 s — ABNORMAL HIGH (ref 9.1–12.0)

## 2023-06-02 NOTE — Patient Instructions (Addendum)
Description   Called and spoke with pt and instructed pt to continue taking warfarin 1/2 tablet daily except 1 tablet each Mondays and Fridays. Recheck INR in 5 weeks at [facility].   Coumadin Clinic # [phone]

## 2023-06-05 ENCOUNTER — Encounter: Payer: Managed Care, Other (non HMO) | Admitting: Internal Medicine

## 2023-06-09 ENCOUNTER — Encounter: Payer: Self-pay | Admitting: Internal Medicine

## 2023-06-09 ENCOUNTER — Ambulatory Visit: Payer: Managed Care, Other (non HMO) | Admitting: Internal Medicine

## 2023-06-09 VITALS — BP 120/76 | HR 56 | Ht 70.0 in | Wt 169.1 lb

## 2023-06-09 DIAGNOSIS — I2510 Atherosclerotic heart disease of native coronary artery without angina pectoris: Secondary | ICD-10-CM | POA: Diagnosis not present

## 2023-06-09 DIAGNOSIS — K625 Hemorrhage of anus and rectum: Secondary | ICD-10-CM

## 2023-06-09 DIAGNOSIS — Z0000 Encounter for general adult medical examination without abnormal findings: Secondary | ICD-10-CM | POA: Diagnosis not present

## 2023-06-09 DIAGNOSIS — E785 Hyperlipidemia, unspecified: Secondary | ICD-10-CM | POA: Diagnosis not present

## 2023-06-09 DIAGNOSIS — Z125 Encounter for screening for malignant neoplasm of prostate: Secondary | ICD-10-CM

## 2023-06-09 DIAGNOSIS — R4589 Other symptoms and signs involving emotional state: Secondary | ICD-10-CM | POA: Diagnosis not present

## 2023-06-09 DIAGNOSIS — E039 Hypothyroidism, unspecified: Secondary | ICD-10-CM | POA: Diagnosis not present

## 2023-06-09 DIAGNOSIS — E663 Overweight: Secondary | ICD-10-CM | POA: Diagnosis not present

## 2023-06-09 DIAGNOSIS — E559 Vitamin D deficiency, unspecified: Secondary | ICD-10-CM

## 2023-06-09 DIAGNOSIS — I10 Essential (primary) hypertension: Secondary | ICD-10-CM | POA: Diagnosis not present

## 2023-06-09 DIAGNOSIS — R351 Nocturia: Secondary | ICD-10-CM

## 2023-06-09 DIAGNOSIS — Z23 Encounter for immunization: Secondary | ICD-10-CM

## 2023-06-09 DIAGNOSIS — K219 Gastro-esophageal reflux disease without esophagitis: Secondary | ICD-10-CM

## 2023-06-09 DIAGNOSIS — Z954 Presence of other heart-valve replacement: Secondary | ICD-10-CM

## 2023-06-09 DIAGNOSIS — R5383 Other fatigue: Secondary | ICD-10-CM

## 2023-06-09 DIAGNOSIS — F5101 Primary insomnia: Secondary | ICD-10-CM

## 2023-06-09 HISTORY — DX: Other symptoms and signs involving emotional state: R45.89

## 2023-06-09 NOTE — Assessment & Plan Note (Signed)
Managed with low dose benzodiazepine for early waking . He is aware of the risk of chronic benzodiazepine use and does not use alprazolam every night;  Alternates 1/2  tablet  with dinnertime dosing of  melatonin

## 2023-06-09 NOTE — Assessment & Plan Note (Signed)
Triggered in part by numerous colleagues who have been diagnosed with CA of various origins. Despite a negative ROS,  he has a foreboding feeling that "Something isn't right with my body"  and requests comprehensive testing

## 2023-06-09 NOTE — Assessment & Plan Note (Signed)
he reports compliance with medication regimen  but has an elevated reading today in office.  He has been checking his  BP at  home and all readings have been < 130/80

## 2023-06-09 NOTE — Patient Instructions (Signed)
Referral to Dr Mia Creek is in progress for colon ca screening,  gastritis etc

## 2023-06-09 NOTE — Assessment & Plan Note (Signed)
Digital rectal exam twas deferred,  he is due for PSA

## 2023-06-09 NOTE — Assessment & Plan Note (Signed)
Worse since running.  Referring to gi for colonoscopy

## 2023-06-09 NOTE — Assessment & Plan Note (Signed)
Tolerating crestor ,last ldl was 97 (goal < 70)  Lab Results  Component Value Date   CHOL 156 06/04/2022   HDL 39 (L) 06/04/2022   LDLCALC 97 06/04/2022   TRIG 106 06/04/2022   CHOLHDL 4.0 06/04/2022

## 2023-06-09 NOTE — Assessment & Plan Note (Signed)
Managed with Crestor for CAD noted during workup for AVR last known LDL was 77 per [facility] cardiology on Crestor 40 mg daily

## 2023-06-09 NOTE — Progress Notes (Signed)
Patient ID: DEMITRUS HARPIN, male    DOB: 1966-05-03  Age: 57 y.o. MRN: [id number]  The patient is here for annual preventive examination and management of other chronic and acute problems.   The risk factors are reflected in the social history.   The roster of all physicians providing medical care to patient - is listed in the Snapshot section of the chart.   Activities of daily living:  The patient is 100% independent in all ADLs: dressing, toileting, feeding as well as independent mobility   Home safety : The patient has smoke detectors in the home. They wear seatbelts.  There are no unsecured firearms at home. There is no violence in the home.    There is no risks for hepatitis, STDs or HIV. There is no   history of blood transfusion. They have no travel history to infectious disease endemic areas of the world.   The patient has seen their dentist in the last six month. They have seen their eye doctor in the last year. The patinet  denies slight hearing difficulty with regard to whispered voices and some television programs.  They have deferred audiologic testing in the last year.  They do not  have excessive sun exposure. Discussed the need for sun protection: hats, long sleeves and use of sunscreen if there is significant sun exposure.    Diet: the importance of a healthy diet is discussed. They do have a healthy diet.   The benefits of regular aerobic exercise were discussed. The patient  exercises  3 to 5 days per week  for  60 minutes.    Depression screen: there are no signs or vegative symptoms of depression- irritability, change in appetite, anhedonia, sadness/tearfullness.   The following portions of the patient's history were reviewed and updated as appropriate: allergies, current medications, past family history, past medical history,  past surgical history, past social history  and problem list.   Visual acuity was not assessed per patient preference since the patient has  regular follow up with an  ophthalmologist. Hearing and body mass index were assessed and reviewed.    During the course of the visit the patient was educated and counseled about appropriate screening and preventive services including : fall prevention , diabetes screening, nutrition counseling, colorectal cancer screening, and recommended immunizations.    Chief Complaint:   1) increased emotional lability with lots  of stressors. Something is't right with my body.  Loss of 8 lbs since oct 2023.  But running more.  Appetite good.  Bowels ok  bladder 2 times per night .  Sleeping poorly.  Uses alprazolam to manage the early wakeups.     Review of Symptoms  Patient denies headache, fevers, malaise, unintentional weight loss, skin rash, eye pain, sinus congestion and sinus pain, sore throat, dysphagia,  hemoptysis , cough, dyspnea, wheezing, chest pain, palpitations, orthopnea, edema, abdominal pain, nausea, melena, diarrhea, constipation, flank pain, dysuria, hematuria, urinary  Frequency, nocturia, numbness, tingling, seizures,  Focal weakness, Loss of consciousness,  Tremor, insomnia, depression, anxiety, and suicidal ideation.    Physical Exam:  BP 120/76   Pulse (!) 56   Ht 5\' 10"  (1.778 m)   Wt 169 lb 1.9 oz (76.7 kg)   SpO2 97%   BMI 24.27 kg/m    Physical Exam Vitals reviewed.  Constitutional:      General: He is not in acute distress.    Appearance: Normal appearance. He is normal weight. He is not ill-appearing, toxic-appearing  or diaphoretic.  HENT:     Head: Normocephalic and atraumatic.     Right Ear: Tympanic membrane, ear canal and external ear normal. There is no impacted cerumen.     Left Ear: Tympanic membrane, ear canal and external ear normal. There is no impacted cerumen.     Nose: Nose normal.     Mouth/Throat:     Mouth: Mucous membranes are moist.     Pharynx: Oropharynx is clear.  Eyes:     General: No scleral icterus.       Right eye: No discharge.         Left eye: No discharge.     Conjunctiva/sclera: Conjunctivae normal.  Neck:     Thyroid: No thyromegaly.     Vascular: No carotid bruit or JVD.  Cardiovascular:     Rate and Rhythm: Normal rate and regular rhythm.     Heart sounds: Normal heart sounds.  Pulmonary:     Effort: Pulmonary effort is normal. No respiratory distress.     Breath sounds: Normal breath sounds.  Abdominal:     General: Bowel sounds are normal.     Palpations: Abdomen is soft. There is no mass.     Tenderness: There is no abdominal tenderness. There is no guarding or rebound.  Musculoskeletal:        General: Normal range of motion.     Cervical back: Normal range of motion and neck supple.  Lymphadenopathy:     Cervical: No cervical adenopathy.  Skin:    General: Skin is warm and dry.  Neurological:     General: No focal deficit present.     Mental Status: He is alert and oriented to person, place, and time. Mental status is at baseline.  Psychiatric:        Mood and Affect: Mood normal.        Behavior: Behavior normal.        Thought Content: Thought content normal.        Judgment: Judgment normal.    Assessment and Plan: Routine physical examination Assessment & Plan: age appropriate education and counseling updated, referrals for preventative services and immunizations addressed, dietary and smoking counseling addressed, most recent labs reviewed.  I have personally reviewed and have noted:   1) the patient's medical and social history 2) The pt's use of alcohol, tobacco, and illicit drugs 3) The patient's current medications and supplements 4) Functional ability including ADL's, fall risk, home safety risk, hearing and visual impairment 5) Diet and physical activities 6) Evidence for depression or mood disorder 7) The patient's height, weight, and BMI have been recorded in the chart   I have made referrals, and provided counseling and education based on review of the above    Essential  hypertension Assessment & Plan: he reports compliance with medication regimen  but has an elevated reading today in office.  He has been checking his  BP at  home and all readings have been < 130/80  Orders: -     Comprehensive metabolic panel -     Microalbumin / creatinine urine ratio  Acquired hypothyroidism Assessment & Plan: TSH is pending . Taking 50 mcg Lab Results  Component Value Date   TSH 3.410 06/04/2022     Orders: -     TSH  Dyslipidemia -     Lipid panel -     LDL cholesterol, direct -     Hemoglobin A1c -     CBC with  Differential/Platelet  Overweight (BMI 25.0-29.9) -     Hemoglobin A1c -     CBC with Differential/Platelet  CAD in native artery Assessment & Plan: Tolerating crestor ,last ldl was 97 (goal < 70)  Lab Results  Component Value Date   CHOL 156 06/04/2022   HDL 39 (L) 06/04/2022   LDLCALC 97 06/04/2022   TRIG 106 06/04/2022   CHOLHDL 4.0 06/04/2022      S/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  Prostate cancer screening -     PSA  Other fatigue -     Urinalysis, Routine w reflex microscopic -     Testosterone,Free and Total -     B12 and Folate Panel  Nocturia -     Urine Culture  Vitamin D deficiency -     VITAMIN D 25 Hydroxy (Vit-D Deficiency, Fractures)  Rectal bleeding Assessment & Plan: Worse since running.  Referring to gi for colonoscopy  Orders: -     Ambulatory referral to Gastroenterology  Gastroesophageal reflux disease without esophagitis -     Ambulatory referral to Gastroenterology  Screening for prostate cancer Assessment & Plan: Digital rectal exam twas deferred,  he is due for PSA    Primary insomnia Assessment & Plan: Managed with low dose benzodiazepine for early waking . He is aware of the risk of chronic benzodiazepine use and does not use alprazolam every night;  Alternates 1/2  tablet  with dinnertime dosing of  melatonin   Anxiety about health Assessment & Plan: Triggered in  part by numerous colleagues who have been diagnosed with CA of various origins. Despite a negative ROS,  he has a foreboding feeling that "Something isn't right with my body"  and requests comprehensive testing      No follow-ups on file.  Sherlene Shams, MD

## 2023-06-09 NOTE — Assessment & Plan Note (Signed)

## 2023-06-09 NOTE — Assessment & Plan Note (Signed)
TSH is pending . Taking 50 mcg Lab Results  Component Value Date   TSH 3.410 06/04/2022

## 2023-06-10 ENCOUNTER — Other Ambulatory Visit: Payer: Self-pay | Admitting: Internal Medicine

## 2023-06-13 LAB — COMPREHENSIVE METABOLIC PANEL
ALT: 29 [IU]/L (ref 0–44)
AST: 36 [IU]/L (ref 0–40)
Albumin: 4.6 g/dL (ref 3.8–4.9)
Alkaline Phosphatase: 57 [IU]/L (ref 44–121)
BUN/Creatinine Ratio: 11 (ref 9–20)
BUN: 14 mg/dL (ref 6–24)
Bilirubin Total: 0.6 mg/dL (ref 0.0–1.2)
CO2: 22 mmol/L (ref 20–29)
Calcium: 9.6 mg/dL (ref 8.7–10.2)
Chloride: 99 mmol/L (ref 96–106)
Creatinine, Ser: 1.3 mg/dL — ABNORMAL HIGH (ref 0.76–1.27)
Globulin, Total: 2.4 g/dL (ref 1.5–4.5)
Glucose: 92 mg/dL (ref 70–99)
Potassium: 4.4 mmol/L (ref 3.5–5.2)
Sodium: 136 mmol/L (ref 134–144)
Total Protein: 7 g/dL (ref 6.0–8.5)
eGFR: 64 mL/min/{1.73_m2} (ref >59)

## 2023-06-13 LAB — URINE CULTURE

## 2023-06-13 LAB — LIPID PANEL
Chol/HDL Ratio: 3 ratio (ref 0.0–5.0)
Cholesterol, Total: 148 mg/dL (ref 100–199)
HDL: 50 mg/dL (ref >39)
LDL Chol Calc (NIH): 77 mg/dL (ref 0–99)
Triglycerides: 114 mg/dL (ref 0–149)
VLDL Cholesterol Cal: 21 mg/dL (ref 5–40)

## 2023-06-13 LAB — CBC WITH DIFFERENTIAL/PLATELET
Basophils Absolute: 0 10*3/uL (ref 0.0–0.2)
Basos: 0 %
EOS (ABSOLUTE): 0.1 10*3/uL (ref 0.0–0.4)
Eos: 1 %
Hematocrit: 44 % (ref 37.5–51.0)
Hemoglobin: 14.8 g/dL (ref 13.0–17.7)
Immature Grans (Abs): 0 10*3/uL (ref 0.0–0.1)
Immature Granulocytes: 0 %
Lymphocytes Absolute: 1.6 10*3/uL (ref 0.7–3.1)
Lymphs: 16 %
MCH: 30.5 pg (ref 26.6–33.0)
MCHC: 33.6 g/dL (ref 31.5–35.7)
MCV: 91 fL (ref 79–97)
Monocytes Absolute: 1 10*3/uL — ABNORMAL HIGH (ref 0.1–0.9)
Monocytes: 10 %
Neutrophils Absolute: 7.3 10*3/uL — ABNORMAL HIGH (ref 1.4–7.0)
Neutrophils: 73 %
Platelets: 223 10*3/uL (ref 150–450)
RBC: 4.86 x10E6/uL (ref 4.14–5.80)
RDW: 13.7 % (ref 11.6–15.4)
WBC: 10.1 10*3/uL (ref 3.4–10.8)

## 2023-06-13 LAB — LDL CHOLESTEROL, DIRECT: LDL Direct: 84 mg/dL (ref 0–99)

## 2023-06-13 LAB — B12 AND FOLATE PANEL
Folate: 10.9 ng/mL (ref >3.0)
Vitamin B-12: 335 pg/mL (ref 232–1245)

## 2023-06-13 LAB — URINALYSIS, ROUTINE W REFLEX MICROSCOPIC
Bilirubin, UA: NEGATIVE
Glucose, UA: NEGATIVE
Ketones, UA: NEGATIVE
Leukocytes,UA: NEGATIVE
Nitrite, UA: NEGATIVE
Protein,UA: NEGATIVE
RBC, UA: NEGATIVE
Specific Gravity, UA: 1.013 (ref 1.005–1.030)
Urobilinogen, Ur: 0.2 mg/dL (ref 0.2–1.0)
pH, UA: 5.5 (ref 5.0–7.5)

## 2023-06-13 LAB — TSH: TSH: 3.36 u[IU]/mL (ref 0.450–4.500)

## 2023-06-13 LAB — VITAMIN D 25 HYDROXY (VIT D DEFICIENCY, FRACTURES): Vit D, 25-Hydroxy: 43.4 ng/mL (ref 30.0–100.0)

## 2023-06-13 LAB — PSA: Prostate Specific Ag, Serum: 0.4 ng/mL (ref 0.0–4.0)

## 2023-06-13 LAB — TESTOSTERONE,FREE AND TOTAL
Testosterone, Free: 9.2 pg/mL (ref 7.2–24.0)
Testosterone: 697 ng/dL (ref 264–916)

## 2023-06-13 LAB — HEMOGLOBIN A1C
Est. average glucose Bld gHb Est-mCnc: 123 mg/dL
Hgb A1c MFr Bld: 5.9 % — ABNORMAL HIGH (ref 4.8–5.6)

## 2023-06-13 LAB — MICROALBUMIN / CREATININE URINE RATIO
Creatinine, Urine: 103.6 mg/dL
Microalb/Creat Ratio: 5 mg/g{creat} (ref 0–29)
Microalbumin, Urine: 4.9 ug/mL

## 2023-06-22 ENCOUNTER — Other Ambulatory Visit: Payer: Self-pay | Admitting: Internal Medicine

## 2023-06-22 NOTE — Telephone Encounter (Signed)
LOV: 06/09/2023   NOV: 06/11/2024

## 2023-06-28 ENCOUNTER — Encounter: Payer: Self-pay | Admitting: Internal Medicine

## 2023-06-29 ENCOUNTER — Telehealth: Payer: Self-pay | Admitting: Internal Medicine

## 2023-06-29 NOTE — Telephone Encounter (Signed)
[facility] called concerning patient. They would like for someone to call them. Her name is Frank [name]. The number is [phone].

## 2023-06-29 NOTE — Telephone Encounter (Signed)
Call was transferred to Dr. Derrel Nip.

## 2023-07-07 ENCOUNTER — Telehealth: Payer: Self-pay | Admitting: *Deleted

## 2023-07-07 LAB — PROTIME-INR

## 2023-07-07 NOTE — Telephone Encounter (Signed)
Called pt since INR was due on yesterday. He states he can see it sitting in preliminary and in many cases it may be some sort of issue. He states he will call [facility] and give [place] an update. If he has to have it redrawn he states he will get it done. Will wait and follow up.

## 2023-07-08 ENCOUNTER — Telehealth: Payer: Self-pay | Admitting: *Deleted

## 2023-07-08 LAB — PROTIME-INR: INR: 2.9 — AB (ref 0.80–1.20)

## 2023-07-08 NOTE — Telephone Encounter (Signed)
Spoke with pt and he states he was able to get the lab redrawn on today at 7am and it should be resulted tomorrow. Advised will follow up tomorrow regarding the INR lab draw.    Pt will need his lab order released for next INR draw.

## 2023-07-09 ENCOUNTER — Ambulatory Visit (INDEPENDENT_AMBULATORY_CARE_PROVIDER_SITE_OTHER): Payer: Managed Care, Other (non HMO) | Admitting: Internal Medicine

## 2023-07-09 DIAGNOSIS — Z954 Presence of other heart-valve replacement: Secondary | ICD-10-CM | POA: Diagnosis not present

## 2023-07-09 DIAGNOSIS — Z5181 Encounter for therapeutic drug level monitoring: Secondary | ICD-10-CM

## 2023-07-09 NOTE — Patient Instructions (Signed)
Description   Called and spoke with pt and instructed pt to continue taking warfarin 1/2 tablet daily except 1 tablet each Mondays and Fridays. Recheck INR in 5 weeks at [facility].   Coumadin Clinic # [phone]

## 2023-08-13 ENCOUNTER — Telehealth: Payer: Self-pay

## 2023-08-13 NOTE — Telephone Encounter (Signed)
Called pt to make him aware INR is due today, no answer. Left message on voicemail with call back number.

## 2023-08-14 ENCOUNTER — Ambulatory Visit (INDEPENDENT_AMBULATORY_CARE_PROVIDER_SITE_OTHER): Payer: Self-pay

## 2023-08-14 ENCOUNTER — Telehealth: Payer: Self-pay

## 2023-08-14 ENCOUNTER — Other Ambulatory Visit: Payer: Self-pay

## 2023-08-14 DIAGNOSIS — Z5181 Encounter for therapeutic drug level monitoring: Secondary | ICD-10-CM

## 2023-08-14 DIAGNOSIS — Z954 Presence of other heart-valve replacement: Secondary | ICD-10-CM

## 2023-08-14 LAB — PROTIME-INR
INR: 1.8 — ABNORMAL HIGH (ref 0.9–1.2)
Prothrombin Time: 18.9 s — ABNORMAL HIGH (ref 9.1–12.0)

## 2023-08-14 NOTE — Patient Instructions (Signed)
Description   Called and spoke with pt and instructed to take 1.5 tablets today and then continue taking warfarin 1/2 tablet daily except 1 tablet each Mondays and Fridays. - COUMADIN CLINIC WILL CALL SOON WITH WARFARIN DOSING INSTRUCTIONS PRE/POST PROCEDURE.  Recheck INR in 1 week post procedure at [facility].   Coumadin Clinic # [phone]

## 2023-08-14 NOTE — Telephone Encounter (Signed)
   Name: Frank [name]  DOB: August 17, 1966  MRN: [id number]  Primary Cardiologist: Chrystie Nose, MD Procedure: Colonoscopy and EGD on 08/27/23   Preoperative team, please contact this patient and set up a phone call appointment for further preoperative risk assessment. Please obtain consent and complete medication review. Thank you for your help.  I confirm that guidance regarding antiplatelet and oral anticoagulation therapy has been completed and, if necessary, noted below.  Per office protocol, patient can hold warfarin for 5 days prior to procedure.   Patient will NOT need bridging with Lovenox (enoxaparin) around procedure.  I also confirmed the patient resides in the state of [name]. As per [facility] Medical Board telemedicine laws, the patient must reside in the state in which the provider is licensed.   Rip Harbour, NP 08/14/2023, 4:49 PM [facility]

## 2023-08-14 NOTE — Telephone Encounter (Signed)
Pt scheduled for endoscopy on 08/27/23. Pt needs cardiac clearance for this procedure. Called and spoke with Victorino Dike at [facility]. Requested clearance be sent to Pre-Op pool as soon as possible. Provided fax number. Victorino Dike stated this would be faxed today.

## 2023-08-14 NOTE — Telephone Encounter (Signed)
   Pre-operative Risk Assessment    Patient Name: Frank [name]  DOB: Sep 24, 1965 MRN: [id number]   Date of last office visit: 03/23/23 Date of next office visit: Not Scheduled   Request for Surgical Clearance    Procedure:   Colonoscopy & EGD  Date of Surgery:  Clearance 08/27/23                                Surgeon:  Dr. Mia Creek Surgeon's Group or Practice Name:  [facility] Phone number:  [phone] Fax number:  [phone]   Type of Clearance Requested:   - Medical  - Pharmacy:  Hold Warfarin (Coumadin) x 5 days   Type of Anesthesia:  Not Indicated   Additional requests/questions:    Garrel Ridgel   08/14/2023, 3:13 PM

## 2023-08-14 NOTE — Telephone Encounter (Signed)
Patient with diagnosis of mechanical aortic valve on warfarin for anticoagulation.    Procedure: Colonoscopy & EGD  Date of procedure: 08/27/23  Per office protocol, patient can hold warfarin for 5 days prior to procedure.    Patient will NOT need bridging with Lovenox (enoxaparin) around procedure.  **This guidance is not considered finalized until pre-operative APP has relayed final recommendations.**

## 2023-08-17 ENCOUNTER — Telehealth: Payer: Self-pay | Admitting: *Deleted

## 2023-08-17 NOTE — Telephone Encounter (Signed)
.    Patient Consent for Virtual Visit        Frank [name] has provided verbal consent on 08/17/2023 for a virtual visit (video or telephone).   CONSENT FOR VIRTUAL VISIT FOR:  Frank [name]  By participating in this virtual visit I agree to the following:  I hereby voluntarily request, consent and authorize [facility] and its employed or contracted physicians, physician assistants, nurse practitioners or other licensed health care professionals (the Practitioner), to provide me with telemedicine health care services (the "Services") as deemed necessary by the treating Practitioner. I acknowledge and consent to receive the Services by the Practitioner via telemedicine. I understand that the telemedicine visit will involve communicating with the Practitioner through live audiovisual communication technology and the disclosure of certain medical information by electronic transmission. I acknowledge that I have been given the opportunity to request an in-person assessment or other available alternative prior to the telemedicine visit and am voluntarily participating in the telemedicine visit.  I understand that I have the right to withhold or withdraw my consent to the use of telemedicine in the course of my care at any time, without affecting my right to future care or treatment, and that the Practitioner or I may terminate the telemedicine visit at any time. I understand that I have the right to inspect all information obtained and/or recorded in the course of the telemedicine visit and may receive copies of available information for a reasonable fee.  I understand that some of the potential risks of receiving the Services via telemedicine include:  Delay or interruption in medical evaluation due to technological equipment failure or disruption; Information transmitted may not be sufficient (e.g. poor resolution of images) to allow for appropriate medical decision making by the  Practitioner; and/or  In rare instances, security protocols could fail, causing a breach of personal health information.  Furthermore, I acknowledge that it is my responsibility to provide information about my medical history, conditions and care that is complete and accurate to the best of my ability. I acknowledge that Practitioner's advice, recommendations, and/or decision may be based on factors not within their control, such as incomplete or inaccurate data provided by me or distortions of diagnostic images or specimens that may result from electronic transmissions. I understand that the practice of medicine is not an exact science and that Practitioner makes no warranties or guarantees regarding treatment outcomes. I acknowledge that a copy of this consent can be made available to me via my patient portal [facility] MyChart), or I can request a printed copy by calling the office of [facility].    I understand that my insurance will be billed for this visit.   I have read or had this consent read to me. I understand the contents of this consent, which adequately explains the benefits and risks of the Services being provided via telemedicine.  I have been provided ample opportunity to ask questions regarding this consent and the Services and have had my questions answered to my satisfaction. I give my informed consent for the services to be provided through the use of telemedicine in my medical care

## 2023-08-17 NOTE — Telephone Encounter (Signed)
I spoke with patient and advised him to Hold Coumadin 1/4-1/8 for Colonoscopy/Endoscopy 1/9.  He will recheck INR 1/16.

## 2023-08-18 ENCOUNTER — Ambulatory Visit: Payer: Managed Care, Other (non HMO) | Attending: Physician Assistant

## 2023-08-18 DIAGNOSIS — Z01810 Encounter for preprocedural cardiovascular examination: Secondary | ICD-10-CM

## 2023-08-18 NOTE — Progress Notes (Signed)
 Virtual Visit via Telephone Note   Because of Frank [name]'s co-morbid illnesses, he is at least at moderate risk for complications without adequate follow up.  This format is felt to be most appropriate for this patient at this time.  The patient did not have access to video technology/had technical difficulties with video requiring transitioning to audio format only (telephone).  All issues noted in this document were discussed and addressed.  No physical exam could be performed with this format.  Please refer to the patient's chart for his consent to telehealth for [facility].  Evaluation Performed:  Preoperative cardiovascular risk assessment _____________   Date:  08/18/2023   Patient ID:  Frank [name], DOB 21-Feb-1966, MRN [id number] Patient Location:  Home Provider location:   Office  Primary Care Provider:  Marylynn Verneita CROME, MD Primary Cardiologist:  Vinie JAYSON Maxcy, MD  Chief Complaint / Patient Profile   57 y.o. y/o male with a h/o moderate AS s/p AVR in 2016 , Bentall procedure in 2016 with Dr. Lucas, GERD, mild nonobstructive CAD, RBBB, hypercholesterolemia who is pending colonoscopy and EGD presents today for telephonic preoperative cardiovascular risk assessment.  History of Present Illness    Frank [name] is a 57 y.o. male who presents via audio/video conferencing for a telehealth visit today.  Pt was last seen in cardiology clinic on 03/23/23 by Dr. Maxcy.  At that time Frank [name] was doing well.  The patient is now pending procedure as outlined above. Since his last visit, he tells me that he has had no chest pain or shortness of breath.  Running 5 miles a day.  He also enjoys biking and tennis.  For this reason he well exceeds the 4 METS minimum requirement.  Per office protocol, patient can hold warfarin for 5 days prior to procedure.  He can resume when medically safe to do so.   Patient will NOT need bridging with Lovenox  (enoxaparin )  around procedure.  Past Medical History    Past Medical History:  Diagnosis Date   Anxiety about health 06/09/2023   Congenital heart valve abnormality    bicuspid aortic valve   Dysrhythmia    PVC's   GERD (gastroesophageal reflux disease)    Heart murmur    Corrected with aortic valve replacement in 2016   Hypercholesterolemia    PONV (postoperative nausea and vomiting)    after a tonsillectomy..none since   Past Surgical History:  Procedure Laterality Date   ANTERIOR CRUCIATE LIGAMENT REPAIR Right 08/01/2014   Procedure: RECONSTRUCTION ANTERIOR CRUCIATE LIGAMENT (ACL) WITH HAMSTRING GRAFT, MENISCAL DEBRIDEMENT AS NEEDED.;  Surgeon: Cordella Glendia Hutchinson, MD;  Location: [facility] OR;  Service: Orthopedics;  Laterality: Right;   BENTALL PROCEDURE N/A 11/27/2014   Procedure: BENTALL PROCEDURE;  Surgeon: Dorise MARLA Lucas, MD;  Location: [facility] OR;  Service: Open Heart Surgery;  Laterality: N/A;  WITH CIRC ARREST   CARDIAC CATHETERIZATION     10/26/2014   CARDIAC VALVE REPLACEMENT  11/27/14   Aortic valve replacement, mechanical valve   COLONOSCOPY  2010   CORONARY ANGIOGRAM  10/26/2014   Procedure: CORONARY ANGIOGRAM;  Surgeon: Vinie JAYSON Maxcy, MD;  Location: [facility] CATH LAB;  Service: Cardiovascular;;   CORONARY ARTERY BYPASS GRAFT  11/27/14   Aortic valve replacement   INGUINAL HERNIA REPAIR  2006   left, Dr. Dessa   TEE WITHOUT CARDIOVERSION N/A 08/01/2014   Procedure: TRANSESOPHAGEAL ECHOCARDIOGRAM (TEE);  Surgeon: Cordella Glendia Hutchinson, MD;  Location: [facility] OR;  Service: Orthopedics;  Laterality: N/A;   TEE WITHOUT CARDIOVERSION N/A 11/27/2014   Procedure: TRANSESOPHAGEAL ECHOCARDIOGRAM (TEE);  Surgeon: Dorise MARLA Fellers, MD;  [facility] OR;  Service: Open Heart Surgery;  Laterality: N/A;   TONSILLECTOMY  2001    Allergies  No Known Allergies  Home Medications    Prior to Admission medications   Medication Sig Start Date End Date Taking? Authorizing Provider  ALPRAZolam  (XANAX ) 0.5 MG  tablet TAKE 1 TABLET BY MOUTH EVERY DAY AT BEDTIME AS NEEDED 06/22/23   Marylynn Verneita CROME, MD  Ascorbic Acid  (VITAMIN C ) 1000 MG tablet Take 1,000 mg by mouth daily.    [provider]  levothyroxine  (SYNTHROID ) 50 MCG tablet TAKE 1 TABLET BY MOUTH DAILY  BEFORE BREAKFAST 04/23/23   Marylynn Verneita CROME, MD  losartan  (COZAAR ) 50 MG tablet Take 1 tablet (50 mg total) by mouth daily. 03/16/23   Tullo, Teresa L, MD  metoprolol  tartrate (LOPRESSOR ) 25 MG tablet TAKE 1 TABLET BY MOUTH TWICE  DAILY 05/19/23   Hilty, Vinie BROCKS, MD  rosuvastatin  (CRESTOR ) 40 MG tablet Take 1 tablet (40 mg total) by mouth daily. 03/23/23 08/17/23  Mona Vinie BROCKS, MD  warfarin (COUMADIN ) 5 MG tablet TAKE 1/2 TO 1 TABLET BY MOUTH  DAILY AS DIRECTED BY COUMADIN   CLINIC 01/14/23   Mona Vinie BROCKS, MD    Physical Exam    Vital Signs:  Frank [name] does not have vital signs available for review today.  Given telephonic nature of communication, physical exam is limited. AAOx3. NAD. Normal affect.  Speech and respirations are unlabored.  Accessory Clinical Findings    None  Assessment & Plan    1.  Preoperative Cardiovascular Risk Assessment:  Frank [name] perioperative risk of a major cardiac event is 0.4% according to the Revised Cardiac Risk Index (RCRI).  Therefore, he is at low risk for perioperative complications.   His functional capacity is excellent at 7.53 METs according to the Duke Activity Status Index (DASI). Recommendations: According to ACC/AHA guidelines, no further cardiovascular testing needed.  The patient may proceed to surgery at acceptable risk.   Antiplatelet and/or Anticoagulation Recommendations:  Coumadin  can by held for 5 days prior to surgery.  Please resume post op when felt to be safe.    A copy of this note will be routed to requesting surgeon.  Time:   Today, I have spent 5 minutes with the patient with telehealth technology discussing medical history, symptoms, and management  plan.     Frank LOISE Fabry, PA-C  08/18/2023, 3:22 PM

## 2023-08-27 ENCOUNTER — Ambulatory Visit: Payer: Managed Care, Other (non HMO)

## 2023-08-27 DIAGNOSIS — K625 Hemorrhage of anus and rectum: Secondary | ICD-10-CM | POA: Diagnosis present

## 2023-08-27 DIAGNOSIS — K5730 Diverticulosis of large intestine without perforation or abscess without bleeding: Secondary | ICD-10-CM | POA: Diagnosis not present

## 2023-08-27 DIAGNOSIS — K640 First degree hemorrhoids: Secondary | ICD-10-CM | POA: Diagnosis not present

## 2023-08-27 DIAGNOSIS — K2950 Unspecified chronic gastritis without bleeding: Secondary | ICD-10-CM | POA: Diagnosis not present

## 2023-09-04 ENCOUNTER — Telehealth: Payer: Self-pay

## 2023-09-04 NOTE — Telephone Encounter (Signed)
Lpm to check if he had Lab drawn

## 2023-09-05 LAB — PROTIME-INR
INR: 1.5 — ABNORMAL HIGH (ref 0.9–1.2)
Prothrombin Time: 16.8 s — ABNORMAL HIGH (ref 9.1–12.0)

## 2023-09-07 ENCOUNTER — Ambulatory Visit (INDEPENDENT_AMBULATORY_CARE_PROVIDER_SITE_OTHER): Payer: Managed Care, Other (non HMO) | Admitting: Cardiovascular Disease

## 2023-09-07 DIAGNOSIS — Z954 Presence of other heart-valve replacement: Secondary | ICD-10-CM

## 2023-09-07 DIAGNOSIS — Z5181 Encounter for therapeutic drug level monitoring: Secondary | ICD-10-CM

## 2023-09-08 ENCOUNTER — Other Ambulatory Visit: Payer: Self-pay | Admitting: Internal Medicine

## 2023-09-08 DIAGNOSIS — Z954 Presence of other heart-valve replacement: Secondary | ICD-10-CM

## 2023-10-01 ENCOUNTER — Telehealth: Payer: Self-pay

## 2023-10-01 NOTE — Telephone Encounter (Addendum)
INR due today. Called and spoke with pt. Frank [name] states he was prescribed Amoxicillin -Vonoprazan (Voquezna) for H Pylori treatment on Monday, 09/28/23 and was instructed to recheck his INR 1 week from starting this medication (10/05/23).   I made pt aware this medication can increase INR and requested him to have INR drawn today or tomorrow prior to the weekend. Pt states he is unable to go today; however, he will go tomorrow morning.

## 2023-10-02 ENCOUNTER — Telehealth: Payer: Self-pay | Admitting: *Deleted

## 2023-10-02 NOTE — Telephone Encounter (Signed)
Called pt since INR is due, left message since we need to know if he went to have INR drawn or not today.   Pt returned the call and states he went at 12 noon today. He is aware we will follow up with the results on Monday.

## 2023-10-03 LAB — PROTIME-INR
INR: 3 — ABNORMAL HIGH (ref 0.9–1.2)
Prothrombin Time: 30.7 s — ABNORMAL HIGH (ref 9.1–12.0)

## 2023-10-05 ENCOUNTER — Ambulatory Visit (INDEPENDENT_AMBULATORY_CARE_PROVIDER_SITE_OTHER): Payer: Managed Care, Other (non HMO) | Admitting: Cardiovascular Disease

## 2023-10-05 DIAGNOSIS — Z5181 Encounter for therapeutic drug level monitoring: Secondary | ICD-10-CM

## 2023-10-05 DIAGNOSIS — Z954 Presence of other heart-valve replacement: Secondary | ICD-10-CM

## 2023-10-14 ENCOUNTER — Telehealth: Payer: Self-pay

## 2023-10-14 LAB — PROTIME-INR: INR: 2.4 — AB (ref 0.80–1.20)

## 2023-10-14 NOTE — Telephone Encounter (Signed)
 INR due. Called pt, no answer. Left message on voicemail.

## 2023-10-15 ENCOUNTER — Ambulatory Visit (INDEPENDENT_AMBULATORY_CARE_PROVIDER_SITE_OTHER): Payer: Self-pay

## 2023-10-15 DIAGNOSIS — Z5181 Encounter for therapeutic drug level monitoring: Secondary | ICD-10-CM | POA: Diagnosis not present

## 2023-10-15 NOTE — Patient Instructions (Signed)
 Description   Spoke with pt and instructed to continue taking warfarin 1/2 tablet daily except 1 tablet each Mondays and Fridays.  Voquezna completed 2/24.   Recheck INR in 4 weeks at [facility].   Coumadin Clinic # [phone]

## 2023-11-12 LAB — PROTIME-INR: INR: 3 — AB (ref 0.80–1.20)

## 2023-11-13 ENCOUNTER — Ambulatory Visit (INDEPENDENT_AMBULATORY_CARE_PROVIDER_SITE_OTHER): Admitting: *Deleted

## 2023-11-13 DIAGNOSIS — Z5181 Encounter for therapeutic drug level monitoring: Secondary | ICD-10-CM

## 2023-11-13 DIAGNOSIS — Z954 Presence of other heart-valve replacement: Secondary | ICD-10-CM | POA: Diagnosis not present

## 2023-12-16 LAB — PROTIME-INR: INR: 2.7 — AB (ref 0.80–1.20)

## 2023-12-17 ENCOUNTER — Ambulatory Visit (INDEPENDENT_AMBULATORY_CARE_PROVIDER_SITE_OTHER): Payer: Self-pay

## 2023-12-17 DIAGNOSIS — Z5181 Encounter for therapeutic drug level monitoring: Secondary | ICD-10-CM | POA: Diagnosis not present

## 2023-12-17 NOTE — Patient Instructions (Signed)
 Description   Spoke with pt and instructed to continue taking warfarin 1/2 tablet daily except 1 tablet each Mondays and Fridays.  Voquezna completed 2/24.   Recheck INR in 6 weeks at [facility].   Coumadin  Clinic # [phone]

## 2023-12-30 ENCOUNTER — Other Ambulatory Visit: Payer: Self-pay | Admitting: Internal Medicine

## 2023-12-31 ENCOUNTER — Other Ambulatory Visit: Payer: Self-pay | Admitting: Internal Medicine

## 2023-12-31 ENCOUNTER — Encounter: Payer: Self-pay | Admitting: Internal Medicine

## 2023-12-31 MED ORDER — ALPRAZOLAM 0.5 MG PO TABS: ORAL_TABLET | ORAL | 0 refills | Status: DC

## 2023-12-31 NOTE — Telephone Encounter (Signed)
 Refilled: 06/22/2023 Last OV: 06/09/2023 Next OV: 06/13/2024

## 2024-01-13 ENCOUNTER — Other Ambulatory Visit: Payer: Self-pay | Admitting: Internal Medicine

## 2024-01-21 ENCOUNTER — Ambulatory Visit: Admitting: Internal Medicine

## 2024-01-21 ENCOUNTER — Encounter: Payer: Self-pay | Admitting: Internal Medicine

## 2024-01-21 VITALS — BP 130/76 | HR 64 | Temp 97.5°F | Ht 70.0 in | Wt 165.6 lb

## 2024-01-21 DIAGNOSIS — M791 Myalgia, unspecified site: Secondary | ICD-10-CM

## 2024-01-21 DIAGNOSIS — I471 Supraventricular tachycardia, unspecified: Secondary | ICD-10-CM

## 2024-01-21 DIAGNOSIS — E785 Hyperlipidemia, unspecified: Secondary | ICD-10-CM | POA: Diagnosis not present

## 2024-01-21 DIAGNOSIS — I10 Essential (primary) hypertension: Secondary | ICD-10-CM | POA: Diagnosis not present

## 2024-01-21 DIAGNOSIS — R5383 Other fatigue: Secondary | ICD-10-CM | POA: Diagnosis not present

## 2024-01-21 DIAGNOSIS — F5101 Primary insomnia: Secondary | ICD-10-CM

## 2024-01-21 DIAGNOSIS — E039 Hypothyroidism, unspecified: Secondary | ICD-10-CM | POA: Diagnosis not present

## 2024-01-21 DIAGNOSIS — R7303 Prediabetes: Secondary | ICD-10-CM

## 2024-01-21 DIAGNOSIS — E559 Vitamin D deficiency, unspecified: Secondary | ICD-10-CM

## 2024-01-21 DIAGNOSIS — Z1211 Encounter for screening for malignant neoplasm of colon: Secondary | ICD-10-CM

## 2024-01-21 DIAGNOSIS — I2510 Atherosclerotic heart disease of native coronary artery without angina pectoris: Secondary | ICD-10-CM

## 2024-01-21 MED ORDER — ALPRAZOLAM 0.5 MG PO TABS: ORAL_TABLET | ORAL | 5 refills | Status: DC

## 2024-01-21 NOTE — Progress Notes (Signed)
 Subjective:  Patient ID: Frank [name], male    DOB: [birth year]  Age: 58 y.o. MRN: [id number]  CC: The primary encounter diagnosis was Colon cancer screening. Diagnoses of Essential hypertension, Acquired hypothyroidism, Dyslipidemia, Other fatigue, Prediabetes, Muscle pain, Vitamin D  deficiency, Primary insomnia, CAD in native artery, and SVT (supraventricular tachycardia) (HCC) were also pertinent to this visit.   HPI Frank [name] presents for  Chief Complaint  Patient presents with   Medical Management of Chronic Issues   Follow up on multiple issues  1) General health:  he is exercising more since he entered semi retirement for the last 6-7 weeks.  He notes that his muscles have been  hurting more since e began running 5K daily,   2) Hypertension: patient checks blood pressure twice weekly at home.  Readings have been for the most part <130/80 at rest . Patient is following a reduced salt diet most days and is taking medications as prescribed   3) anxiety;  using alprazolam  prn   Outpatient Medications Prior to Visit  Medication Sig Dispense Refill   Ascorbic Acid  (VITAMIN C ) 1000 MG tablet Take 1,000 mg by mouth daily.     levothyroxine  (SYNTHROID ) 50 MCG tablet TAKE 1 TABLET BY MOUTH DAILY  BEFORE BREAKFAST 90 tablet 3   losartan  (COZAAR ) 50 MG tablet TAKE 1 TABLET BY MOUTH DAILY 90 tablet 1   metoprolol  tartrate (LOPRESSOR ) 25 MG tablet TAKE 1 TABLET BY MOUTH TWICE  DAILY 180 tablet 2   warfarin (COUMADIN ) 5 MG tablet TAKE 1/2 TO 1 TABLET BY MOUTH  DAILY AS DIRECTED BY COUMADIN   CLINIC 90 tablet 3   ALPRAZolam  (XANAX ) 0.5 MG tablet TAKE 1 TABLET BY MOUTH EVERY DAY AT BEDTIME AS NEEDED 30 tablet 0   rosuvastatin  (CRESTOR ) 40 MG tablet Take 1 tablet (40 mg total) by mouth daily. 90 tablet 3   No facility-administered medications prior to visit.    Review of Systems;  Patient denies headache, fevers, malaise, unintentional weight loss, skin rash, eye pain, sinus  congestion and sinus pain, sore throat, dysphagia,  hemoptysis , cough, dyspnea, wheezing, chest pain, palpitations, orthopnea, edema, abdominal pain, nausea, melena, diarrhea, constipation, flank pain, dysuria, hematuria, urinary  Frequency, nocturia, numbness, tingling, seizures,  Focal weakness, Loss of consciousness,  Tremor, insomnia, depression, anxiety, and suicidal ideation.      Objective:  BP 130/76   Pulse 64   Temp (!) 97.5 F (36.4 C)   Ht 5\' 10"  (1.778 m)   Wt 165 lb 9.6 oz (75.1 kg)   SpO2 99%   BMI 23.76 kg/m   BP Readings from Last 3 Encounters:  01/21/24 130/76  06/09/23 120/76  03/23/23 120/80    Wt Readings from Last 3 Encounters:  01/21/24 165 lb 9.6 oz (75.1 kg)  06/09/23 169 lb 1.9 oz (76.7 kg)  03/23/23 174 lb 6.4 oz (79.1 kg)    Physical Exam  Lab Results  Component Value Date   HGBA1C 5.9 (H) 06/10/2023   HGBA1C 6.0 01/25/2021   HGBA1C 5.9 03/08/2020    Lab Results  Component Value Date   CREATININE 1.30 (H) 06/10/2023   CREATININE 1.29 (H) 10/15/2022   CREATININE 1.26 06/11/2022    Lab Results  Component Value Date   WBC 10.1 06/10/2023   HGB 14.8 06/10/2023   HCT 44.0 06/10/2023   PLT 223 06/10/2023   GLUCOSE 92 06/10/2023   CHOL 148 06/10/2023   TRIG 114 06/10/2023   HDL 50 06/10/2023  LDLDIRECT 84 06/10/2023   LDLCALC 77 06/10/2023   ALT 29 06/10/2023   AST 36 06/10/2023   NA 136 06/10/2023   K 4.4 06/10/2023   CL 99 06/10/2023   CREATININE 1.30 (H) 06/10/2023   BUN 14 06/10/2023   CO2 22 06/10/2023   TSH 3.360 06/10/2023   PSA 0.4 04/28/2012   INR 2.70 (A) 12/16/2023   HGBA1C 5.9 (H) 06/10/2023    MR Knee Left w/o contrast Result Date: 06/10/2020 CLINICAL DATA:  Left calf swelling. Concern for gastrocnemius tendon rupture EXAM: MRI OF THE LEFT KNEE WITHOUT CONTRAST TECHNIQUE: Multiplanar, multisequence MR imaging of the knee was performed. No intravenous contrast was administered. COMPARISON:  X-ray 05/29/2020,  ultrasound 05/22/2020 FINDINGS: There is a complex collection located within the medial head of the gastrocnemius muscle which is T2 heterogeneously hyperintense with intermediate to high intrinsic T1 signal and a well-defined very low T1/T2 signal intensity rim suggestive of a intramuscular hematoma. The full inferior extent of this collection was not included within the field of view. The visualized portion measures 3.5 x 3.4 cm transaxially by at least 3.5 cm craniocaudally, and likely larger in the CC dimension. MENISCI Medial meniscus:  Intact. Lateral meniscus:  Intact. LIGAMENTS Cruciates:  Intact ACL and PCL. Collaterals: Medial collateral ligament is intact. Lateral collateral ligament complex is intact. CARTILAGE Patellofemoral:  No chondral defect. Medial: Mild surface irregularity of the weight-bearing medial femoral condyle. Lateral:  No chondral defect. Joint:  No joint effusion.  Unremarkable fat pads. Popliteal Fossa: Moderate-sized Baker's cyst measuring up to 5.3 cm in length. Intact popliteus tendon. Extensor Mechanism:  Intact quadriceps tendon and patellar tendon. Bones: No focal marrow signal abnormality. No fracture or dislocation. Other: The medial and lateral heads of the gastrocnemius muscle origins at the posterior surface of the distal femur appear intact. IMPRESSION: 1. Large complex collection located within the medial head of the gastrocnemius muscle within the proximal calf. The full distal extent was not entirely included within the field of view. Findings are most consistent with a intramuscular hematoma. 2. The medial and lateral heads of the gastrocnemius muscle origins at the posterior surface of the distal femur appear intact. 3. Moderate-sized Baker's cyst measuring up to 5.3 cm in length. 4. Intact menisci and ligamentous structures. 5. Mild early degenerative changes of the medial compartment. Electronically Signed   By: Leverne Reading D.O.   On: 06/10/2020 14:29     Assessment & Plan:  .Colon cancer screening -     Cologuard  Essential hypertension -     Comprehensive metabolic panel with GFR  Acquired hypothyroidism Assessment & Plan: Thyroid function has been  WNL on current dose.   Repeat level need given muscle pain    Lab Results  Component Value Date   TSH 3.360 06/10/2023     Orders: -     TSH  Dyslipidemia Assessment & Plan: Managed with Crestor  for CAD noted during workup for AVR. repeat lipids are due . Goal LDL 70  Lab Results  Component Value Date   CHOL 148 06/10/2023   HDL 50 06/10/2023   LDLCALC 77 06/10/2023   LDLDIRECT 84 06/10/2023   TRIG 114 06/10/2023   CHOLHDL 3.0 06/10/2023   Lab Results  Component Value Date   ALT 29 06/10/2023   AST 36 06/10/2023   ALKPHOS 57 06/10/2023   BILITOT 0.6 06/10/2023     Orders: -     Lipid panel -     LDL cholesterol, direct  Other fatigue -     CBC with Differential/Platelet  Prediabetes -     Comprehensive metabolic panel with GFR -     Hemoglobin A1c  Muscle pain Assessment & Plan: Unclear if symptoms are statin induced or exercise induced.  CK ordered   Orders: -     CK -     Magnesium   Vitamin D  deficiency -     VITAMIN D  25 Hydroxy (Vit-D Deficiency, Fractures)  Primary insomnia Assessment & Plan: Managed with low dose benzodiazepine for early waking . He is aware of the risk of chronic benzodiazepine use and does not use alprazolam  every night;  Alternates 1/2  tablet  with dinnertime dosing of  melatonin   CAD in native artery Assessment & Plan: Tolerating crestor  , strenuous exercise without chest pain . Last LDL was 77. Repeat ordered   Lab Results  Component Value Date   CHOL 148 06/10/2023   HDL 50 06/10/2023   LDLCALC 77 06/10/2023   LDLDIRECT 84 06/10/2023   TRIG 114 06/10/2023   CHOLHDL 3.0 06/10/2023      SVT (supraventricular tachycardia) (HCC) Assessment & Plan: Managed with low dose metoprolol  .  No changes today     Other orders -     ALPRAZolam ; TAKE 1 TABLET BY MOUTH EVERY DAY AT BEDTIME AS NEEDED  Dispense: 30 tablet; Refill: 5     I spent 34 minutes on the day of this face to face encounter reviewing patient's  most recent visit with cardiology,   prior relevant surgical and non surgical procedures, recent  labs and imaging studies, counseling on weight management,  reviewing the assessment and plan with patient, and post visit ordering and reviewing of  diagnostics and therapeutics with patient  .   Follow-up: Return in about 6 months (around 07/22/2024).   Thersia Flax, MD

## 2024-01-23 DIAGNOSIS — M791 Myalgia, unspecified site: Secondary | ICD-10-CM | POA: Insufficient documentation

## 2024-01-23 DIAGNOSIS — T466X5A Adverse effect of antihyperlipidemic and antiarteriosclerotic drugs, initial encounter: Secondary | ICD-10-CM | POA: Insufficient documentation

## 2024-01-23 NOTE — Assessment & Plan Note (Signed)
 Thyroid function has been  WNL on current dose.   Repeat level need given muscle pain    Lab Results  Component Value Date   TSH 3.360 06/10/2023

## 2024-01-23 NOTE — Assessment & Plan Note (Signed)
 Managed with Crestor  for CAD noted during workup for AVR. repeat lipids are due . Goal LDL 70  Lab Results  Component Value Date   CHOL 148 06/10/2023   HDL 50 06/10/2023   LDLCALC 77 06/10/2023   LDLDIRECT 84 06/10/2023   TRIG 114 06/10/2023   CHOLHDL 3.0 06/10/2023   Lab Results  Component Value Date   ALT 29 06/10/2023   AST 36 06/10/2023   ALKPHOS 57 06/10/2023   BILITOT 0.6 06/10/2023

## 2024-01-23 NOTE — Assessment & Plan Note (Signed)
 Tolerating crestor  , strenuous exercise without chest pain . Last LDL was 77. Repeat ordered   Lab Results  Component Value Date   CHOL 148 06/10/2023   HDL 50 06/10/2023   LDLCALC 77 06/10/2023   LDLDIRECT 84 06/10/2023   TRIG 114 06/10/2023   CHOLHDL 3.0 06/10/2023

## 2024-01-23 NOTE — Assessment & Plan Note (Signed)
Managed with low dose benzodiazepine for early waking . He is aware of the risk of chronic benzodiazepine use and does not use alprazolam every night;  Alternates 1/2  tablet  with dinnertime dosing of  melatonin

## 2024-01-23 NOTE — Assessment & Plan Note (Signed)
 Unclear if symptoms are statin induced or exercise induced.  CK ordered

## 2024-01-23 NOTE — Assessment & Plan Note (Signed)
 Managed with low dose metoprolol  .  No changes today

## 2024-01-26 ENCOUNTER — Other Ambulatory Visit: Payer: Self-pay | Admitting: Internal Medicine

## 2024-01-27 LAB — PROTIME-INR: INR: 2.7 — AB (ref 0.80–1.20)

## 2024-01-28 ENCOUNTER — Ambulatory Visit: Payer: Self-pay | Admitting: Internal Medicine

## 2024-01-28 ENCOUNTER — Ambulatory Visit (INDEPENDENT_AMBULATORY_CARE_PROVIDER_SITE_OTHER): Payer: Self-pay | Admitting: Cardiology

## 2024-01-28 DIAGNOSIS — Z5181 Encounter for therapeutic drug level monitoring: Secondary | ICD-10-CM

## 2024-01-28 DIAGNOSIS — Z954 Presence of other heart-valve replacement: Secondary | ICD-10-CM

## 2024-01-28 LAB — COMPREHENSIVE METABOLIC PANEL WITH GFR
ALT: 23 IU/L (ref 0–44)
AST: 27 IU/L (ref 0–40)
Albumin: 4.5 g/dL (ref 3.8–4.9)
Alkaline Phosphatase: 70 IU/L (ref 44–121)
BUN/Creatinine Ratio: 13 (ref 9–20)
BUN: 15 mg/dL (ref 6–24)
Bilirubin Total: 0.5 mg/dL (ref 0.0–1.2)
CO2: 19 mmol/L — ABNORMAL LOW (ref 20–29)
Calcium: 9.4 mg/dL (ref 8.7–10.2)
Chloride: 103 mmol/L (ref 96–106)
Creatinine, Ser: 1.19 mg/dL (ref 0.76–1.27)
Globulin, Total: 2.5 g/dL (ref 1.5–4.5)
Glucose: 96 mg/dL (ref 70–99)
Potassium: 4.4 mmol/L (ref 3.5–5.2)
Sodium: 138 mmol/L (ref 134–144)
Total Protein: 7 g/dL (ref 6.0–8.5)
eGFR: 71 mL/min/{1.73_m2} (ref >59)

## 2024-01-28 LAB — CBC WITH DIFFERENTIAL/PLATELET
Basophils Absolute: 0 10*3/uL (ref 0.0–0.2)
Basos: 1 %
EOS (ABSOLUTE): 0.2 10*3/uL (ref 0.0–0.4)
Eos: 3 %
Hematocrit: 44 % (ref 37.5–51.0)
Hemoglobin: 14.4 g/dL (ref 13.0–17.7)
Immature Grans (Abs): 0 10*3/uL (ref 0.0–0.1)
Immature Granulocytes: 0 %
Lymphocytes Absolute: 2.1 10*3/uL (ref 0.7–3.1)
Lymphs: 35 %
MCH: 30 pg (ref 26.6–33.0)
MCHC: 32.7 g/dL (ref 31.5–35.7)
MCV: 92 fL (ref 79–97)
Monocytes Absolute: 0.7 10*3/uL (ref 0.1–0.9)
Monocytes: 12 %
Neutrophils Absolute: 3 10*3/uL (ref 1.4–7.0)
Neutrophils: 49 %
Platelets: 196 10*3/uL (ref 150–450)
RBC: 4.8 x10E6/uL (ref 4.14–5.80)
RDW: 13.2 % (ref 11.6–15.4)
WBC: 6 10*3/uL (ref 3.4–10.8)

## 2024-01-28 LAB — LDL CHOLESTEROL, DIRECT: LDL Direct: 78 mg/dL (ref 0–99)

## 2024-01-28 LAB — VITAMIN D 25 HYDROXY (VIT D DEFICIENCY, FRACTURES): Vit D, 25-Hydroxy: 42.5 ng/mL (ref 30.0–100.0)

## 2024-01-28 LAB — LIPID PANEL
Chol/HDL Ratio: 3.1 ratio (ref 0.0–5.0)
Cholesterol, Total: 142 mg/dL (ref 100–199)
HDL: 46 mg/dL (ref >39)
LDL Chol Calc (NIH): 81 mg/dL (ref 0–99)
Triglycerides: 79 mg/dL (ref 0–149)
VLDL Cholesterol Cal: 15 mg/dL (ref 5–40)

## 2024-01-28 LAB — HEMOGLOBIN A1C
Est. average glucose Bld gHb Est-mCnc: 117 mg/dL
Hgb A1c MFr Bld: 5.7 % — ABNORMAL HIGH (ref 4.8–5.6)

## 2024-01-28 LAB — MAGNESIUM: Magnesium: 1.9 mg/dL (ref 1.6–2.3)

## 2024-01-28 LAB — TSH: TSH: 3.22 u[IU]/mL (ref 0.450–4.500)

## 2024-01-28 LAB — CK: Total CK: 273 U/L (ref 41–331)

## 2024-01-28 NOTE — Patient Instructions (Signed)
 Description   Spoke with pt and instructed to continue taking warfarin 1/2 tablet daily except 1 tablet each Mondays and Fridays.  Voquezna completed 2/24.   Recheck INR in 6 weeks at [facility].   Coumadin  Clinic # [phone]

## 2024-02-16 ENCOUNTER — Ambulatory Visit: Admitting: Internal Medicine

## 2024-03-10 LAB — PROTIME-INR
INR: 2.8 — ABNORMAL HIGH (ref 0.9–1.2)
Prothrombin Time: 28.7 s — ABNORMAL HIGH (ref 9.1–12.0)

## 2024-03-16 ENCOUNTER — Other Ambulatory Visit: Payer: Self-pay | Admitting: *Deleted

## 2024-03-16 ENCOUNTER — Ambulatory Visit (INDEPENDENT_AMBULATORY_CARE_PROVIDER_SITE_OTHER): Admitting: *Deleted

## 2024-03-16 DIAGNOSIS — Z5181 Encounter for therapeutic drug level monitoring: Secondary | ICD-10-CM | POA: Diagnosis not present

## 2024-03-16 DIAGNOSIS — Z954 Presence of other heart-valve replacement: Secondary | ICD-10-CM

## 2024-03-16 NOTE — Progress Notes (Signed)
 INR-2.8 Please see anticoagulation encounter

## 2024-03-16 NOTE — Patient Instructions (Signed)
 Description   Spoke with pt and instructed to continue taking warfarin 1/2 tablet daily except 1 tablet each Mondays and Fridays.  [place] completed 2/24.   Recheck INR in 6 weeks at [facility]. Release standing order. Coumadin  Clinic # [phone]

## 2024-04-08 ENCOUNTER — Encounter: Payer: Self-pay | Admitting: Internal Medicine

## 2024-04-08 ENCOUNTER — Ambulatory Visit: Attending: Internal Medicine | Admitting: Internal Medicine

## 2024-04-08 VITALS — BP 120/70 | HR 54 | Ht 70.0 in | Wt 166.0 lb

## 2024-04-08 DIAGNOSIS — Z9889 Other specified postprocedural states: Secondary | ICD-10-CM

## 2024-04-08 DIAGNOSIS — I2510 Atherosclerotic heart disease of native coronary artery without angina pectoris: Secondary | ICD-10-CM | POA: Diagnosis not present

## 2024-04-08 DIAGNOSIS — E785 Hyperlipidemia, unspecified: Secondary | ICD-10-CM

## 2024-04-08 DIAGNOSIS — Z954 Presence of other heart-valve replacement: Secondary | ICD-10-CM

## 2024-04-08 DIAGNOSIS — I10 Essential (primary) hypertension: Secondary | ICD-10-CM | POA: Diagnosis not present

## 2024-04-08 MED ORDER — METOPROLOL TARTRATE 25 MG PO TABS: 12.5000 mg | ORAL_TABLET | Freq: Two times a day (BID) | ORAL | 3 refills | Status: AC

## 2024-04-08 NOTE — Progress Notes (Signed)
 OFFICE NOTE  Chief Complaint:  No complaints  Primary Care Physician: Marylynn Verneita CROME, MD  HPI:  Frank [name] is a 58 y.o. male with a history of at least moderate AS (followed by Dr. Florencio at the [facility]), presented for ACL repair with Dr. Addie. Preoperatively, he was evaluated by Dr. Leopoldo (cardiac anesthesia) and found to have a loud AS murmur- After some discussion, interoperative TEE was recommended and performed. I personally reviewed the TEE images which do demonstrate severe aortic stenosis, moderate AI and mild to moderate MR (obtained during surgery, on pressor, PPV, etc, but still significant). He was recently told he probably had another 5 years before needing to consider surgery. Mean gradient is already above 40 mmHg.  Clinically, he has been asymptomatic (no chest pain, dyspnea, syncope, etc,) but recently he has had more palpitations (PVC's) and was started on b-blocker.  He did manage to get through surgery uneventfully.    He returns today in follow-up to the office. He continues to deny any chest pain, worsening shortness of breath, presyncope or syncopal episodes. He has not pushed himself significantly due to his recent knee surgery. He is undergoing rehabilitation and getting stronger. A repeat echocardiogram was just performed as an outpatient. This was initially read by one of my partners and indicated mild to moderate aortic stenosis based on a mean valve gradient in the 20s. However subsequent images during the study do indicate mean gradients greater than 50 from the right upper sternal border using Pedoff probe. I therefore personally reviewed the echocardiogram images and made the appropriate corrections,  and the results are as follows:   Study Conclusions  - Left ventricle: The cavity size was normal. Wall thickness was   increased in a pattern of mild LVH. Systolic function was normal.   The estimated ejection fraction was in the range of 55% to  60%. - Aortic valve: The aortic valve is bicuspid and is heavily   calcified with restricted leaflet motion. Peak and mean gradients   through the valve are 100 and 59 mm Hg respectively, consistent   with severe AS. The calculated AVA is 0.6 cm2. There is moderate,   eccentric AI. There was mild regurgitation. - Aorta: Aortic root dimension: 39 mm (ED). - Aortic root: The aortic root is mildly dilated. - Mitral valve: Calcified annulus. Mildly thickened leaflets .   There was mild regurgitation. - Left atrium: LA Volume/BSA= 46.4 ml/m2. The atrium is moderately   dilated.  Impressions:  - LVEF 60-65%, moderate concentric LVH, bicuspid and severely   stenotic aortic valve- AVA 0.6 cm2, peak and mean gradients of   100 mmHg and 59 mmHg, respectively - LVOT diameter measured at   2.4 cm. There is moderate AI. There is mild to moderate MR with   restricted anterior leaflet motion secondary to eccentric AI.   Pedoff measurements do indeed show a very high mean gradient of 59 and peak gradient of 100 mmHg. Based on a generous LVOT diameter of 2.4 cm, the calculated aortic valve gradient was 0.6 cm consistent with severe aortic stenosis.   I saw Frank [name] back in the office today. He was recently on a trip and noticed increasing shortness of breath and easy fatigue with exercise which was a new finding for him. He feels that this is symptomatic aortic stenosis and I would agree. I discussed the case further with Dr. Lucas, the cardiac thoracic surgeon that he's been seeing, and we feel  that it's probably the time now to entertain valve replacement. Based on that there is also concern about aortopathy given his bicuspid valve. He underwent a CT scan of the aorta and coronaries which showed does reveal multivessel coronary artery calcification as well as calcium  deposits in the left main coronary. The aortic valve is heavily calcified and restricted in motion. There was at least mild cardiac  hypertrophy and dilatation of the left ventricle. The ascending aorta is dilated measuring 4.3-4.4 cm. There is no evidence for coarctation or enlargement of the thoracic or descending aorta.   Frank [name] returns today for follow-up. He successfully underwent Bentall procedure with hemi-arch replacement using a 28 mm Hemashield graft under deep hypothermic circulatory arrest and Bentall Procedure ( replacement of aortic valve and aortic root with reimplantation of the coronary arteries) using a 21 mm St. Jude mechanical valve graft.  Overall he is doing exceedingly well. He continues to do exercise. He is also recovering from recent knee surgery. He denies any chest pain or worsening shortness of breath. His energy levels improved significantly. His median sternotomy is healing well. He did have some postoperative anemia however this seems to have improved and his energy level is better.   I saw Frank [name] back in the office today for follow-up. He is doing exceedingly well. He's been active and working and traveling without any restrictions. He denies any chest pain or shortness of breath. He's had a small amount of weight gain however BMI is only 26. He feels better than he has in years he says. Recently we increased his cholesterol medication as his LDL was still not treated to goal. Particle number was just over 1100. He is due for recheck of this next month. He says he does get some mild muscle soreness after exercise that does not resolve quickly which he may attribute to increase in Crestor .   01/16/2016   Frank [name] returns today for follow-up. He reports that he is doing exceedingly well. His last use been excellent for him. He is exercising more regularly. He's had a small amount of weight gain but is less than 5 pounds and is starting to come down. He denies any chest pain or shortness of breath. His blood pressure is slightly higher recently however and he was started on losartan  50 mg daily.  Initial blood pressure today was 130/90 however recheck was 110/80 in the left arm and 112/82 in the right arm. He was concerned about some differential blood pressures in the past however this difference is negligible. In discussion today we realized he had not had an echocardiogram since his valve surgery and we need to establish a baseline valve gradient.  08/22/2016  Frank [name] returns today for follow-up. He says is feeling exceedingly well. He exercises regularly now and has a better energy level. He denies any shortness of breath or chest pain. He's been compliant with his INR checks and is Coumadin  levels have been appropriate. Blood pressure appears well controlled. He has excellent cholesterol control with LDL in the 80s on rosuvastatin .   08/28/2017  Frank [name] is doing well and is without complaints. He is pleased with his AVR. He exercises regularly and has gained some muscle mass. He has maintained a therapeutic INR. He had an echo this past Summer which demonstrated a stable aortic valve gradient and normal LV function. BP is well controlled. Recent labs show LDL of 81 on Crestor .  09/02/2018  Frank [name] is seen today in follow-up.  He is doing extremely well.  He says he is not felt this great in years.  He was struggling with some palpitations however is added some supplementary potassium to his diet which is helped his symptoms.  He has been therapeutic on warfarin which is been stable.  Weight is stable.  Blood pressure is now well controlled.  He denies any chest pain or shortness of breath.  He exercises several times a week.  09/21/2019  Frank [name] returns today for annual follow-up.  He recently underwent an echo which showed normal systolic function, preserved mechanical aortic valve gradient and stable ascending aortic diameters.  Symptomatically he feels well.  He has no new complaints is worsening shortness of breath or chest pain.  He had some recent labs but is not had  reassessment of his lipids or thyroid.  He is on a low-dose of thyroid replacement.  He has been concerned about some recent development of gynecomastia.  He underwent endocrine testing for this which was unremarkable.  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  11/01/2020  Frank [name] is seen today in follow-up.  Overall he says this past year has been the best so far.  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  This was associated with significant bleeding and hematoma due to being on warfarin.  It was not surgically repaired.  He has recovered from it.  Other than that he is doing well.  His INRs have been very stable.  He had an echo last year which showed stable aortic size and normal gradients across his mechanical aortic valve.  Blood pressure is normal.  Weight is stable.  12/31/2021  Frank [name] is seen today in follow-up.  Overall he says he is feeling well.  He exercises every day and has no complaints such as chest pain or worsening shortness of breath.  Recent repeat echo which shows a stable mechanical aortic valve gradient and normal LV function.  He is compliant with dental prophylaxis.  INR has been very stable.  I had performed heart catheterization on him in March 2016 which showed only mild luminal irregularities.  Subsequently he has had good control of her his blood pressure and cholesterol.  Recent LDL was 105 on rosuvastatin  40 mg daily.  03/23/2023  Frank [name] returns today for follow-up.  He says he is feeling exceptionally well.  He is exercising regularly.  He denies any chest pain or shortness of breath.  He has had no palpitations.  His INRs have been fairly stable with some slightly higher INR's recently however he is very compliant with his medication on diet.  Uses dental prophylaxis.  His last echo last year showed stable aortic valve gradients.  He is aortic arch repair  has been stable.  He had repeat lipids from his primary care provider.  Total cholesterol 141, triglycerides 109, HDL 44 and LDL 77 on 40 mg rosuvastatin .  He is tolerating this well.  His A1c was 6.0% however fasting glucose was 99.  04/08/2024  Frank [name] returns today for follow-up.  He says he is now probably in the best shape that he has been in his whole life.  He is exercising regularly discussed his work back down to about 20 hours a week.  He has noticed that he has lost weight in the setting of more regular exercise.  He notes some positional dizziness and wonders  if his heart rate is a factor in that.  He does note that his heart rate can increase with exercise.  He also wonders if his blood pressure may be an issue although he is 126/76 today.  Home blood pressure readings were no lower than about 110 systolic.  He also had repeat lipids in June showing total cholesterol 142, HDL 46, triglycerides 79 and LDL 81.  He has had some concerns about a statin which he says may be causing muscle aches after we increased the dose from 20 to 40 mg in the past.   PMHx:  Past Medical History:  Diagnosis Date   Anxiety about health 06/09/2023   Congenital heart valve abnormality    bicuspid aortic valve   Dysrhythmia    PVC's   GERD (gastroesophageal reflux disease)    Heart murmur    Corrected with aortic valve replacement in 2016   Hypercholesterolemia    PONV (postoperative nausea and vomiting)    after a tonsillectomy..none since    Past Surgical History:  Procedure Laterality Date   ANTERIOR CRUCIATE LIGAMENT REPAIR Right 08/01/2014   Procedure: RECONSTRUCTION ANTERIOR CRUCIATE LIGAMENT (ACL) WITH HAMSTRING GRAFT, MENISCAL DEBRIDEMENT AS NEEDED.;  Surgeon: Cordella Glendia Hutchinson, MD;  Location: [facility] OR;  Service: Orthopedics;  Laterality: Right;   BENTALL PROCEDURE N/A 11/27/2014   Procedure: BENTALL PROCEDURE;  Surgeon: Dorise MARLA Fellers, MD;  Location: [facility] OR;  Service: Open Heart Surgery;   Laterality: N/A;  WITH CIRC ARREST   CARDIAC CATHETERIZATION     10/26/2014   CARDIAC VALVE REPLACEMENT  11/27/14   Aortic valve replacement, mechanical valve   COLONOSCOPY  2010   CORONARY ANGIOGRAM  10/26/2014   Procedure: CORONARY ANGIOGRAM;  Surgeon: Vinie JAYSON Maxcy, MD;  Location: [facility] CATH LAB;  Service: Cardiovascular;;   CORONARY ARTERY BYPASS GRAFT  11/27/14   Aortic valve replacement   INGUINAL HERNIA REPAIR  2006   left, Dr. Dessa   TEE WITHOUT CARDIOVERSION N/A 08/01/2014   Procedure: TRANSESOPHAGEAL ECHOCARDIOGRAM (TEE);  Surgeon: Cordella Glendia Hutchinson, MD;  Location: [facility] OR;  Service: Orthopedics;  Laterality: N/A;   TEE WITHOUT CARDIOVERSION N/A 11/27/2014   Procedure: TRANSESOPHAGEAL ECHOCARDIOGRAM (TEE);  Surgeon: Dorise MARLA Fellers, MD;  Location: [facility] OR;  Service: Open Heart Surgery;  Laterality: N/A;   TONSILLECTOMY  2001    FAMHx:  Family History  Problem Relation Age of Onset   Arthritis Mother        psoriatis, Crohn's    Heart disease Father        CAD in his early 49s    SOCHx:   reports that he has never smoked. He has never used smokeless tobacco. He reports that he does not currently use alcohol after a past usage of about 1.0 standard drink of alcohol per week. He reports that he does not use drugs.  ALLERGIES:  No Known Allergies  ROS: Pertinent items noted in HPI and remainder of comprehensive ROS otherwise negative.  HOME MEDS: Current Outpatient Medications on File Prior to Visit  Medication Sig Dispense Refill   ALPRAZolam  (XANAX ) 0.5 MG tablet TAKE 1 TABLET BY MOUTH EVERY DAY AT BEDTIME AS NEEDED 30 tablet 5   Ascorbic Acid  (VITAMIN C ) 1000 MG tablet Take 1,000 mg by mouth daily.     levothyroxine  (SYNTHROID ) 50 MCG tablet TAKE 1 TABLET BY MOUTH DAILY  BEFORE BREAKFAST 90 tablet 3   losartan  (COZAAR ) 50 MG tablet TAKE 1 TABLET BY MOUTH DAILY 90 tablet 1  metoprolol  tartrate (LOPRESSOR ) 25 MG tablet TAKE 1 TABLET BY MOUTH TWICE  DAILY 180 tablet 1    rosuvastatin  (CRESTOR ) 40 MG tablet TAKE 1 TABLET BY MOUTH DAILY 90 tablet 1   warfarin (COUMADIN ) 5 MG tablet TAKE 1/2 TO 1 TABLET BY MOUTH  DAILY AS DIRECTED BY COUMADIN   CLINIC 90 tablet 3   No current facility-administered medications on file prior to visit.    LABS/IMAGING: No results found for this or any previous visit (from the past 48 hours). No results found.  WEIGHTS: Wt Readings from Last 3 Encounters:  04/08/24 166 lb (75.3 kg)  01/21/24 165 lb 9.6 oz (75.1 kg)  06/09/23 169 lb 1.9 oz (76.7 kg)    VITALS: BP 120/70   Pulse (!) 54   Ht 5' 10 (1.778 m)   Wt 166 lb (75.3 kg)   SpO2 98%   BMI 23.82 kg/m   EXAM: General appearance: alert and no distress Neck: no carotid bruit and no JVD Lungs: clear to auscultation bilaterally Heart: regular rate and rhythm, S1, S2 normal and Sharp mechanical valve sounds Abdomen: soft, non-tender; bowel sounds normal; no masses,  no organomegaly Extremities: extremities normal, atraumatic, no cyanosis or edema Pulses: 2+ and symmetric Skin: Skin color, texture, turgor normal. No rashes or lesions Neurologic: Grossly normal I: Pleasant  EKG: EKG Interpretation Date/Time:  Friday April 08 2024 09:49:15 EDT Ventricular Rate:  54 PR Interval:  148 QRS Duration:  122 QT Interval:  432 QTC Calculation: 409 R Axis:   84  Text Interpretation: Sinus bradycardia Right bundle branch block When compared with ECG of 23-Mar-2023 08:18, No significant change was found Confirmed by Mona Kent [phone]) on 04/08/2024 10:01:27 AM    ASSESSMENT: Status post aVR with the mechanical valve (11/27/2014) Anticoagulated on warfarin Bentall hemi-arch repair for ascending aortic aneurysm (11/27/2014) Mild nonobstructive coronary disease Dyslipidemia History of bicuspid aortic valve with aortopathy RBBB  PLAN: 1.   Frank [name] has had some positional dizziness and wonders if it might be related to heart rate or blood pressure.  Both of  those actually look pretty good today although he has had some bradycardia at rest.  He exercises regularly.  I advised decreasing his metoprolol  to 12.5 mg twice a day.  Will continue his current dose of the losartan .  Will get an updated echo since it has been about 2 years since his last study.  EKG looks unchanged.  He is cholesterol still above target.  He has been having some muscle aches on the higher dose rosuvastatin .  I advised a 2-week statin holiday and he should contact me to see if that improves.  Will likely decrease it back to 20 mg daily and repeat lipids.  If he is not at goal he might be a candidate to add ezetimibe.  Plan follow-up otherwise annually or sooner as necessary.  Kent KYM Mona, MD, [facility], FNLA, FACP  [place]  [facility]  Medical Director of the Advanced Lipid Disorders &  Cardiovascular Risk Reduction Clinic Diplomate of the American Board of Clinical Lipidology Attending Cardiologist  Direct Dial: [phone]  Fax: [phone]  Website:  [email or web address] Kent BROCKS Leovardo Thoman 04/08/2024, 10:02 AM

## 2024-04-08 NOTE — Patient Instructions (Signed)
 Medication Instructions:  HOLD rosuvastatin  (crestor ) for 2-3 weeks Contact office via MyChart with update on symptoms  DECREASE dose of metoprolol  tartrate to 12.5mg  twice daily (half of a 25mg  tablet)  *If you need a refill on your cardiac medications before your next appointment, please call your pharmacy*    Testing/Procedures: Your physician has requested that you have an echocardiogram. Echocardiography is a painless test that uses sound waves to create images of your heart. It provides your doctor with information about the size and shape of your heart and how well your heart's chambers and valves are working. This procedure takes approximately one hour. There are no restrictions for this procedure. Please do NOT wear cologne, perfume, aftershave, or lotions (deodorant is allowed). Please arrive 15 minutes prior to your appointment time.  Please note: We ask at that you not bring children with you during ultrasound (echo/ vascular) testing. Due to room size and safety concerns, children are not allowed in the ultrasound rooms during exams. Our front office staff cannot provide observation of children in our lobby area while testing is being conducted. An adult accompanying a patient to their appointment will only be allowed in the ultrasound room at the discretion of the ultrasound technician under special circumstances. We apologize for any inconvenience.   Follow-Up: At [facility], you and your health needs are our priority.  As part of our continuing mission to provide you with exceptional heart care, our providers are all part of one team.  This team includes your primary Cardiologist (physician) and Advanced Practice Providers or APPs (Physician Assistants and Nurse Practitioners) who all work together to provide you with the care you need, when you need it.  Your next appointment:    12 months with Dr. Mona or PA/NP  We recommend signing up for the patient portal  called MyChart.  Sign up information is provided on this After Visit Summary.  MyChart is used to connect with patients for Virtual Visits (Telemedicine).  Patients are able to view lab/test results, encounter notes, upcoming appointments, etc.  Non-urgent messages can be sent to your provider as well.   To learn more about what you can do with MyChart, go to [email or web address].

## 2024-04-20 LAB — PROTIME-INR: INR: 2.1 — AB (ref 0.80–1.20)

## 2024-04-21 ENCOUNTER — Ambulatory Visit (INDEPENDENT_AMBULATORY_CARE_PROVIDER_SITE_OTHER): Admitting: Cardiology

## 2024-04-21 DIAGNOSIS — Z954 Presence of other heart-valve replacement: Secondary | ICD-10-CM

## 2024-04-21 DIAGNOSIS — Z5181 Encounter for therapeutic drug level monitoring: Secondary | ICD-10-CM

## 2024-04-21 NOTE — Progress Notes (Signed)
 INR 2.1; Please see anticoagulation encounter  Lab Results  Component Value Date   INR 2.10 (A) 04/20/2024   INR 2.8 (H) 03/09/2024   INR 2.70 (A) 01/27/2024    Description   Spoke with pt and instructed to continue taking warfarin 1/2 tablet daily except 1 tablet each Mondays and Fridays.  Voquezna completed 2/24.   Recheck INR in 6 weeks at [facility]. Release standing order. Coumadin  Clinic # [phone]

## 2024-04-21 NOTE — Patient Instructions (Signed)
 Description   Spoke with pt and instructed to continue taking warfarin 1/2 tablet daily except 1 tablet each Mondays and Fridays.  [place] completed 2/24.   Recheck INR in 6 weeks at [facility]. Release standing order. Coumadin  Clinic # [phone]

## 2024-04-26 ENCOUNTER — Encounter: Payer: Self-pay | Admitting: Internal Medicine

## 2024-04-27 MED ORDER — ROSUVASTATIN CALCIUM 40 MG PO TABS: 20.0000 mg | ORAL_TABLET | Freq: Every day | ORAL | Status: AC

## 2024-05-09 ENCOUNTER — Ambulatory Visit (HOSPITAL_COMMUNITY)
Admission: RE | Admit: 2024-05-09 | Discharge: 2024-05-09 | Disposition: A | Discharge status: Home / Self Care | Source: Ambulatory Visit | Attending: Cardiology | Admitting: Cardiology

## 2024-05-09 DIAGNOSIS — Z954 Presence of other heart-valve replacement: Secondary | ICD-10-CM

## 2024-05-09 DIAGNOSIS — I340 Nonrheumatic mitral (valve) insufficiency: Secondary | ICD-10-CM

## 2024-05-09 LAB — ECHOCARDIOGRAM COMPLETE
AV Mean grad: 11.5 mmHg
AV Peak grad: 21.3 mmHg
Ao pk vel: 2.31 m/s
S' Lateral: 2.53 cm

## 2024-05-11 MED ORDER — EZETIMIBE 10 MG PO TABS: 10.0000 mg | ORAL_TABLET | Freq: Every day | ORAL | 3 refills | Status: DC

## 2024-05-11 NOTE — Addendum Note (Signed)
 Addended by: LORING ANDRIETTE HERO on: 05/11/2024 05:06 PM   Modules accepted: Orders

## 2024-05-17 ENCOUNTER — Encounter: Payer: Self-pay | Admitting: Internal Medicine

## 2024-05-17 ENCOUNTER — Ambulatory Visit: Payer: Self-pay | Admitting: Internal Medicine

## 2024-06-01 NOTE — Addendum Note (Signed)
 Addended by: CHAUVIGNE, Roger Fasnacht on: 06/01/2024 03:43 PM   Modules accepted: Orders

## 2024-06-03 ENCOUNTER — Other Ambulatory Visit: Payer: Self-pay

## 2024-06-03 DIAGNOSIS — Z954 Presence of other heart-valve replacement: Secondary | ICD-10-CM

## 2024-06-03 DIAGNOSIS — Z5181 Encounter for therapeutic drug level monitoring: Secondary | ICD-10-CM

## 2024-06-04 LAB — PROTIME-INR
INR: 2 — ABNORMAL HIGH (ref 0.9–1.2)
Prothrombin Time: 20.4 s — ABNORMAL HIGH (ref 9.1–12.0)

## 2024-06-06 ENCOUNTER — Other Ambulatory Visit: Payer: Self-pay

## 2024-06-06 ENCOUNTER — Ambulatory Visit (INDEPENDENT_AMBULATORY_CARE_PROVIDER_SITE_OTHER): Admitting: Cardiovascular Disease

## 2024-06-06 DIAGNOSIS — Z954 Presence of other heart-valve replacement: Secondary | ICD-10-CM | POA: Diagnosis not present

## 2024-06-06 DIAGNOSIS — Z5181 Encounter for therapeutic drug level monitoring: Secondary | ICD-10-CM

## 2024-06-06 NOTE — Progress Notes (Signed)
 INR 2.0 Please see anticoagulation encounter Spoke with pt and instructed to continue taking warfarin 1/2 tablet daily except 1 tablet each Mondays and Fridays.  Voquezna completed 2/24.   Recheck INR in 7 weeks at [facility]. Release standing order. Coumadin  Clinic # [phone]

## 2024-06-13 ENCOUNTER — Encounter: Payer: Self-pay | Admitting: Internal Medicine

## 2024-06-13 ENCOUNTER — Ambulatory Visit (INDEPENDENT_AMBULATORY_CARE_PROVIDER_SITE_OTHER): Payer: Managed Care, Other (non HMO) | Admitting: Internal Medicine

## 2024-06-13 VITALS — BP 136/74 | HR 54 | Ht 70.0 in | Wt 162.6 lb

## 2024-06-13 DIAGNOSIS — Z125 Encounter for screening for malignant neoplasm of prostate: Secondary | ICD-10-CM

## 2024-06-13 DIAGNOSIS — Z7901 Long term (current) use of anticoagulants: Secondary | ICD-10-CM

## 2024-06-13 DIAGNOSIS — Z0000 Encounter for general adult medical examination without abnormal findings: Secondary | ICD-10-CM | POA: Diagnosis not present

## 2024-06-13 DIAGNOSIS — K2950 Unspecified chronic gastritis without bleeding: Secondary | ICD-10-CM | POA: Diagnosis not present

## 2024-06-13 DIAGNOSIS — I10 Essential (primary) hypertension: Secondary | ICD-10-CM | POA: Diagnosis not present

## 2024-06-13 DIAGNOSIS — K2970 Gastritis, unspecified, without bleeding: Secondary | ICD-10-CM | POA: Insufficient documentation

## 2024-06-13 DIAGNOSIS — H6122 Impacted cerumen, left ear: Secondary | ICD-10-CM | POA: Diagnosis not present

## 2024-06-13 DIAGNOSIS — M791 Myalgia, unspecified site: Secondary | ICD-10-CM

## 2024-06-13 DIAGNOSIS — T466X5A Adverse effect of antihyperlipidemic and antiarteriosclerotic drugs, initial encounter: Secondary | ICD-10-CM

## 2024-06-13 DIAGNOSIS — E039 Hypothyroidism, unspecified: Secondary | ICD-10-CM

## 2024-06-13 DIAGNOSIS — E785 Hyperlipidemia, unspecified: Secondary | ICD-10-CM

## 2024-06-13 MED ORDER — LOSARTAN POTASSIUM 50 MG PO TABS: 50.0000 mg | ORAL_TABLET | Freq: Every day | ORAL | 1 refills | Status: DC

## 2024-06-13 MED ORDER — HYDROCHLOROTHIAZIDE 12.5 MG PO CAPS
12.5000 mg | ORAL_CAPSULE | Freq: Every day | ORAL | 1 refills | Status: DC
Start: 2024-06-13 — End: 2024-07-13

## 2024-06-13 NOTE — Assessment & Plan Note (Addendum)
 he reports compliance with medication regimen  but has an elevated reading today in office.  Given the appearance of diastolic dysfunction and resting pulse of 57,  will recommend he increase losartan  dose to 100 mg daily  as a trial

## 2024-06-13 NOTE — Assessment & Plan Note (Signed)
 Noted on Jan 2025 EGD .  History of H pylori,  treated.  Has been avoiding NSAIDs

## 2024-06-13 NOTE — Assessment & Plan Note (Signed)
 Tolerating lower dose of crestor .  , zetia  case return of myalgias. Labs needed

## 2024-06-13 NOTE — Progress Notes (Signed)
 Patient ID: Frank [name], male    DOB: [birth year]  Age: 58 y.o. MRN: [id number]  The patient is here for annual preventive examination and management of other chronic and acute problems.   The risk factors are reflected in the social history.   The roster of all physicians providing medical care to patient - is listed in the Snapshot section of the chart.   Activities of daily living:  The patient is 100% independent in all ADLs: dressing, toileting, feeding as well as independent mobility   Home safety : The patient has smoke detectors in the home. They wear seatbelts.  There are no unsecured firearms at home. There is no violence in the home.    There is no risks for hepatitis, STDs or HIV. There is no   history of blood transfusion. They have no travel history to infectious disease endemic areas of the world.   The patient has seen their dentist in the last six month. They have seen their eye doctor in the last year. The patinet  denies slight hearing difficulty with regard to whispered voices and some television programs.  They have deferred audiologic testing in the last year.  They do not  have excessive sun exposure. Discussed the need for sun protection: hats, long sleeves and use of sunscreen if there is significant sun exposure.    Diet: the importance of a healthy diet is discussed. They do have a healthy diet.   The benefits of regular aerobic exercise were discussed. The patient  exercises  3 to 5 days per week  for  60 minutes.    Depression screen: there are no signs or vegative symptoms of depression- irritability, change in appetite, anhedonia, sadness/tearfullness.   The following portions of the patient's history were reviewed and updated as appropriate: allergies, current medications, past family history, past medical history,  past surgical history, past social history  and problem list.   Visual acuity was not assessed per patient preference since the patient has  regular follow up with an  ophthalmologist. Hearing and body mass index were assessed and reviewed.    During the course of the visit the patient was educated and counseled about appropriate screening and preventive services including : fall prevention , diabetes screening, nutrition counseling, colorectal cancer screening, and recommended immunizations.    Chief Complaint:  None   Review of Symptoms  Patient denies headache, fevers, malaise, unintentional weight loss, skin rash, eye pain, sinus congestion and sinus pain, sore throat, dysphagia,  hemoptysis , cough, dyspnea, wheezing, chest pain, palpitations, orthopnea, edema, abdominal pain, nausea, melena, diarrhea, constipation, flank pain, dysuria, hematuria, urinary  Frequency, nocturia, numbness, tingling, seizures,  Focal weakness, Loss of consciousness,  Tremor, insomnia, depression, anxiety, and suicidal ideation.    Physical Exam:  BP 136/74   Pulse (!) 54   Ht 5' 10 (1.778 m)   Wt 162 lb 9.6 oz (73.8 kg)   SpO2 95%   BMI 23.33 kg/m    Physical Exam Vitals reviewed.  Constitutional:      General: He is not in acute distress.    Appearance: Normal appearance. He is normal weight. He is not ill-appearing, toxic-appearing or diaphoretic.  HENT:     Head: Normocephalic and atraumatic.     Right Ear: Tympanic membrane, ear canal and external ear normal. There is no impacted cerumen.     Left Ear: Tympanic membrane, ear canal and external ear normal. There is no impacted cerumen.  Nose: Nose normal.     Mouth/Throat:     Mouth: Mucous membranes are moist.     Pharynx: Oropharynx is clear.  Eyes:     General: No scleral icterus.       Right eye: No discharge.        Left eye: No discharge.     Conjunctiva/sclera: Conjunctivae normal.  Neck:     Thyroid: No thyromegaly.     Vascular: No carotid bruit or JVD.  Cardiovascular:     Rate and Rhythm: Normal rate and regular rhythm.     Heart sounds: Normal heart sounds.   Pulmonary:     Effort: Pulmonary effort is normal. No respiratory distress.     Breath sounds: Normal breath sounds.  Abdominal:     General: Bowel sounds are normal.     Palpations: Abdomen is soft. There is no mass.     Tenderness: There is no abdominal tenderness. There is no guarding or rebound.  Musculoskeletal:        General: Normal range of motion.     Cervical back: Normal range of motion and neck supple.  Lymphadenopathy:     Cervical: No cervical adenopathy.  Skin:    General: Skin is warm and dry.  Neurological:     General: No focal deficit present.     Mental Status: He is alert and oriented to person, place, and time. Mental status is at baseline.  Psychiatric:        Mood and Affect: Mood normal.        Behavior: Behavior normal.        Thought Content: Thought content normal.        Judgment: Judgment normal.     Assessment and Plan: Essential hypertension Assessment & Plan: he reports compliance with medication regimen  but has an elevated reading today in office.  Given the appearance of diastolic dysfunction and resting pulse of 57,  will recommend he increase losartan  dose to 100 mg daily  as a trial   Orders: -     Losartan  Potassium; Take 1 tablet (50 mg total) by mouth daily.  Dispense: 90 tablet; Refill: 1 -     Microalbumin / creatinine urine ratio -     hydroCHLOROthiazide ; Take 1 capsule (12.5 mg total) by mouth daily.  Dispense: 90 capsule; Refill: 1 -     Lipid panel  Prostate cancer screening -     PSA  Encounter for preventative adult health care examination  Myalgia due to statin Assessment & Plan: Tolerating lower dose of crestor .  , zetia  case return of myalgias. Labs needed    Chronic gastritis without bleeding, unspecified gastritis type Assessment & Plan: Noted on Jan 2025 EGD .  History of H pylori,  treated.  Has been avoiding NSAIDs    Hearing loss of left ear due to cerumen impaction -     Ambulatory referral to  ENT  Chronic anticoagulation -     CBC with Differential/Platelet  Dyslipidemia  Acquired hypothyroidism -     Thyroid Panel With TSH    Return in about 6 months (around 12/12/2024).  Verneita LITTIE Kettering, MD

## 2024-06-13 NOTE — Patient Instructions (Addendum)
 Increase tylenol  to max dose of 1000 mg every 8 hours  Try adding turmeric   as your anti inflammatory   Topical diclofenac will  help elbows   PREVNAR 20 AND HEPATITIS B VACCINES  recommended ; can be done at pharmacy    Adding 12.5 mg hydrochlorothiazide  to get bp  to 120/70

## 2024-06-23 LAB — CBC WITH DIFFERENTIAL/PLATELET
Basophils Absolute: 0 x10E3/uL (ref 0.0–0.2)
Basos: 1 %
EOS (ABSOLUTE): 0.2 x10E3/uL (ref 0.0–0.4)
Eos: 3 %
Hematocrit: 46.6 % (ref 37.5–51.0)
Hemoglobin: 15.1 g/dL (ref 13.0–17.7)
Immature Grans (Abs): 0 x10E3/uL (ref 0.0–0.1)
Immature Granulocytes: 0 %
Lymphocytes Absolute: 1.7 x10E3/uL (ref 0.7–3.1)
Lymphs: 25 %
MCH: 29.8 pg (ref 26.6–33.0)
MCHC: 32.4 g/dL (ref 31.5–35.7)
MCV: 92 fL (ref 79–97)
Monocytes Absolute: 0.7 x10E3/uL (ref 0.1–0.9)
Monocytes: 11 %
Neutrophils Absolute: 3.9 x10E3/uL (ref 1.4–7.0)
Neutrophils: 60 %
Platelets: 231 x10E3/uL (ref 150–450)
RBC: 5.06 x10E6/uL (ref 4.14–5.80)
RDW: 13.3 % (ref 11.6–15.4)
WBC: 6.5 x10E3/uL (ref 3.4–10.8)

## 2024-06-23 LAB — LIPID PANEL
Chol/HDL Ratio: 3.4 ratio (ref 0.0–5.0)
Cholesterol, Total: 168 mg/dL (ref 100–199)
HDL: 49 mg/dL (ref >39)
LDL Chol Calc (NIH): 100 mg/dL — ABNORMAL HIGH (ref 0–99)
Triglycerides: 102 mg/dL (ref 0–149)
VLDL Cholesterol Cal: 19 mg/dL (ref 5–40)

## 2024-06-23 LAB — THYROID PANEL WITH TSH
Free Thyroxine Index: 2 (ref 1.2–4.9)
T3 Uptake Ratio: 27 % (ref 24–39)
T4, Total: 7.5 ug/dL (ref 4.5–12.0)
TSH: 3.03 u[IU]/mL (ref 0.450–4.500)

## 2024-06-23 LAB — PSA: Prostate Specific Ag, Serum: 0.4 ng/mL (ref 0.0–4.0)

## 2024-06-26 ENCOUNTER — Ambulatory Visit: Payer: Self-pay | Admitting: Internal Medicine

## 2024-06-27 ENCOUNTER — Other Ambulatory Visit: Payer: Self-pay | Admitting: Internal Medicine

## 2024-06-27 DIAGNOSIS — I10 Essential (primary) hypertension: Secondary | ICD-10-CM

## 2024-07-13 ENCOUNTER — Other Ambulatory Visit: Payer: Self-pay

## 2024-07-13 DIAGNOSIS — I10 Essential (primary) hypertension: Secondary | ICD-10-CM

## 2024-07-13 MED ORDER — HYDROCHLOROTHIAZIDE 12.5 MG PO CAPS: 12.5000 mg | ORAL_CAPSULE | Freq: Every day | ORAL | 1 refills | Status: AC

## 2024-07-14 LAB — PROTIME-INR
INR: 2.2 — ABNORMAL HIGH (ref 0.9–1.2)
Prothrombin Time: 22.4 s — ABNORMAL HIGH (ref 9.1–12.0)

## 2024-07-21 ENCOUNTER — Other Ambulatory Visit: Payer: Self-pay

## 2024-07-21 ENCOUNTER — Ambulatory Visit (INDEPENDENT_AMBULATORY_CARE_PROVIDER_SITE_OTHER): Admitting: Cardiology

## 2024-07-21 DIAGNOSIS — Z954 Presence of other heart-valve replacement: Secondary | ICD-10-CM | POA: Diagnosis not present

## 2024-07-21 DIAGNOSIS — Z5181 Encounter for therapeutic drug level monitoring: Secondary | ICD-10-CM | POA: Diagnosis not present

## 2024-07-21 NOTE — Progress Notes (Signed)
 INR 2.2 Please see anticoagulation encounter Spoke with pt and instructed to continue taking warfarin 1/2 tablet daily except 1 tablet each Mondays and Fridays.  Vo[place]na completed 2/24.   Recheck INR in 7 weeks at [facility]. Release standing order.

## 2024-08-02 ENCOUNTER — Other Ambulatory Visit: Payer: Self-pay | Admitting: Internal Medicine

## 2024-08-09 ENCOUNTER — Telehealth: Admitting: Physician Assistant

## 2024-08-09 DIAGNOSIS — J019 Acute sinusitis, unspecified: Secondary | ICD-10-CM | POA: Diagnosis not present

## 2024-08-09 DIAGNOSIS — R051 Acute cough: Secondary | ICD-10-CM | POA: Diagnosis not present

## 2024-08-09 DIAGNOSIS — B9689 Other specified bacterial agents as the cause of diseases classified elsewhere: Secondary | ICD-10-CM

## 2024-08-09 MED ORDER — FLUTICASONE PROPIONATE 50 MCG/ACT NA SUSP: 2.0000 | Freq: Every day | NASAL | 0 refills | Status: AC

## 2024-08-09 MED ORDER — AMOXICILLIN-POT CLAVULANATE 875-125 MG PO TABS: 1.0000 | ORAL_TABLET | Freq: Two times a day (BID) | ORAL | 0 refills | Status: AC

## 2024-08-09 MED ORDER — BENZONATATE 100 MG PO CAPS: 100.0000 mg | ORAL_CAPSULE | Freq: Three times a day (TID) | ORAL | 0 refills | Status: AC | PRN

## 2024-08-09 NOTE — Progress Notes (Signed)
 E-Visit for Sinus Problems  We are sorry that you are not feeling well.  Here is how we plan to help!  Based on what you have shared with me it looks like you have sinusitis.  Sinusitis is inflammation and infection in the sinus cavities of the head.  Based on your presentation I believe you most likely have Acute Bacterial Sinusitis.  This is an infection caused by bacteria and is treated with antibiotics. I have prescribed Augmentin  875mg /125mg  one tablet twice daily with food, for 7 days. and I have also prescribed Flonase Nasal Spray Use 2 sprays in each nostril daily for 10-14 days and Tessalon  perles 100mg  Take 1-2 capsules every 8 hours as needed for cough. You may use an oral decongestant such as Mucinex D or if you have glaucoma or high blood pressure use plain Mucinex. Saline nasal spray help and can safely be used as often as needed for congestion.  If you develop worsening sinus pain, fever or notice severe headache and vision changes, or if symptoms are not better after completion of antibiotic, please schedule an appointment with a health care provider.    Sinus infections are not as easily transmitted as other respiratory infection, however we still recommend that you avoid close contact with loved ones, especially the very young and elderly.  Remember to wash your hands thoroughly throughout the day as this is the number one way to prevent the spread of infection!  Home Care: Only take medications as instructed by your medical team. Complete the entire course of an antibiotic. Do not take these medications with alcohol. A steam or ultrasonic humidifier can help congestion.  You can place a towel over your head and breathe in the steam from hot water coming from a faucet. Avoid close contacts especially the very young and the elderly. Cover your mouth when you cough or sneeze. Always remember to wash your hands.  Get Help Right Away If: You develop worsening fever or sinus  pain. You develop a severe head ache or visual changes. Your symptoms persist after you have completed your treatment plan.  Make sure you Understand these instructions. Will watch your condition. Will get help right away if you are not doing well or get worse.  Your e-visit answers were reviewed by a board certified advanced clinical practitioner to complete your personal care plan.  Depending on the condition, your plan could have included both over the counter or prescription medications.  If there is a problem please reply  once you have received a response from your provider.  Your safety is important to us .  If you have drug allergies check your prescription carefully.    You can use MyChart to ask questions about today's visit, request a non-urgent call back, or ask for a work or school excuse for 24 hours related to this e-Visit. If it has been greater than 24 hours you will need to follow up with your provider, or enter a new e-Visit to address those concerns.  You will get an e-mail in the next two days asking about your experience.  I hope that your e-visit has been valuable and will speed your recovery. Thank you for using e-visits.  I have spent 5 minutes in review of e-visit questionnaire, review and updating patient chart, medical decision making and response to patient.   Delon CHRISTELLA Dickinson, PA-C

## 2024-09-08 ENCOUNTER — Other Ambulatory Visit: Payer: Self-pay

## 2024-09-08 ENCOUNTER — Ambulatory Visit (INDEPENDENT_AMBULATORY_CARE_PROVIDER_SITE_OTHER): Payer: Self-pay | Admitting: Cardiology

## 2024-09-08 DIAGNOSIS — Z954 Presence of other heart-valve replacement: Secondary | ICD-10-CM | POA: Diagnosis not present

## 2024-09-08 DIAGNOSIS — Z5181 Encounter for therapeutic drug level monitoring: Secondary | ICD-10-CM

## 2024-09-08 LAB — PROTIME-INR
INR: 2.2 — ABNORMAL HIGH (ref 0.9–1.2)
Prothrombin Time: 23.2 s — ABNORMAL HIGH (ref 9.1–12.0)

## 2024-09-08 NOTE — Progress Notes (Signed)
 INR 2.2  Spoke with pt and instructed to continue taking warfarin 1/2 tablet daily except 1 tablet each Mondays and Fridays.    Recheck INR in 7 weeks at [facility]. Release standing order. Coumadin  Clinic # [phone]

## 2024-12-12 ENCOUNTER — Ambulatory Visit: Admitting: Internal Medicine
